# Patient Record
Sex: Female | Born: 1935 | Race: Black or African American | Hispanic: No | Smoking: Former smoker
Health system: Southern US, Community
[De-identification: ages and names within clinical notes are randomized; demographics above are authoritative.]

## PROBLEM LIST (undated history)

## (undated) DIAGNOSIS — F32A Depression, unspecified: Secondary | ICD-10-CM

## (undated) DIAGNOSIS — E7800 Pure hypercholesterolemia, unspecified: Secondary | ICD-10-CM

## (undated) DIAGNOSIS — R42 Dizziness and giddiness: Secondary | ICD-10-CM

## (undated) DIAGNOSIS — G8929 Other chronic pain: Secondary | ICD-10-CM

## (undated) DIAGNOSIS — F0390 Unspecified dementia without behavioral disturbance: Secondary | ICD-10-CM

## (undated) DIAGNOSIS — T8859XA Other complications of anesthesia, initial encounter: Secondary | ICD-10-CM

## (undated) DIAGNOSIS — N289 Disorder of kidney and ureter, unspecified: Secondary | ICD-10-CM

## (undated) DIAGNOSIS — J302 Other seasonal allergic rhinitis: Secondary | ICD-10-CM

## (undated) DIAGNOSIS — R0982 Postnasal drip: Secondary | ICD-10-CM

## (undated) DIAGNOSIS — I10 Essential (primary) hypertension: Secondary | ICD-10-CM

## (undated) DIAGNOSIS — I509 Heart failure, unspecified: Secondary | ICD-10-CM

## (undated) DIAGNOSIS — Z8719 Personal history of other diseases of the digestive system: Secondary | ICD-10-CM

## (undated) DIAGNOSIS — M549 Dorsalgia, unspecified: Secondary | ICD-10-CM

## (undated) DIAGNOSIS — IMO0002 Reserved for concepts with insufficient information to code with codable children: Secondary | ICD-10-CM

## (undated) DIAGNOSIS — G309 Alzheimer's disease, unspecified: Secondary | ICD-10-CM

## (undated) DIAGNOSIS — F0280 Dementia in other diseases classified elsewhere without behavioral disturbance: Secondary | ICD-10-CM

## (undated) DIAGNOSIS — K439 Ventral hernia without obstruction or gangrene: Secondary | ICD-10-CM

## (undated) DIAGNOSIS — F329 Major depressive disorder, single episode, unspecified: Secondary | ICD-10-CM

## (undated) DIAGNOSIS — T4145XA Adverse effect of unspecified anesthetic, initial encounter: Secondary | ICD-10-CM

## (undated) DIAGNOSIS — F419 Anxiety disorder, unspecified: Secondary | ICD-10-CM

## (undated) HISTORY — DX: Major depressive disorder, single episode, unspecified: F32.9

## (undated) HISTORY — DX: Pure hypercholesterolemia, unspecified: E78.00

## (undated) HISTORY — PX: EXTERNAL EAR SURGERY: SHX627

## (undated) HISTORY — PX: BLADDER SURGERY: SHX569

## (undated) HISTORY — PX: BACK SURGERY: SHX140

## (undated) HISTORY — PX: SHOULDER SURGERY: SHX246

## (undated) HISTORY — DX: Anxiety disorder, unspecified: F41.9

## (undated) HISTORY — DX: Depression, unspecified: F32.A

---

## 1998-01-25 ENCOUNTER — Encounter: Admission: RE | Admit: 1998-01-25 | Discharge: 1998-04-25 | Payer: Self-pay | Admitting: Orthopedic Surgery

## 1998-02-01 ENCOUNTER — Ambulatory Visit (HOSPITAL_COMMUNITY): Admission: RE | Admit: 1998-02-01 | Discharge: 1998-02-01 | Payer: Self-pay | Admitting: Orthopedic Surgery

## 1998-02-19 ENCOUNTER — Encounter: Admission: RE | Admit: 1998-02-19 | Discharge: 1998-05-20 | Payer: Self-pay | Admitting: Orthopedic Surgery

## 1998-03-22 ENCOUNTER — Other Ambulatory Visit: Admission: RE | Admit: 1998-03-22 | Discharge: 1998-03-22 | Payer: Self-pay | Admitting: Nephrology

## 1998-03-26 ENCOUNTER — Other Ambulatory Visit: Admission: RE | Admit: 1998-03-26 | Discharge: 1998-03-26 | Payer: Self-pay | Admitting: *Deleted

## 1998-04-20 ENCOUNTER — Emergency Department (HOSPITAL_COMMUNITY): Admission: EM | Admit: 1998-04-20 | Discharge: 1998-04-20 | Payer: Self-pay | Admitting: Emergency Medicine

## 1998-04-22 ENCOUNTER — Emergency Department (HOSPITAL_COMMUNITY): Admission: EM | Admit: 1998-04-22 | Discharge: 1998-04-22 | Payer: Self-pay | Admitting: Emergency Medicine

## 1998-05-14 ENCOUNTER — Encounter: Admission: RE | Admit: 1998-05-14 | Discharge: 1998-08-12 | Payer: Self-pay | Admitting: Orthopedic Surgery

## 1998-06-12 ENCOUNTER — Ambulatory Visit (HOSPITAL_COMMUNITY): Admission: RE | Admit: 1998-06-12 | Discharge: 1998-06-12 | Payer: Self-pay | Admitting: Nephrology

## 1999-01-18 ENCOUNTER — Encounter: Payer: Self-pay | Admitting: Emergency Medicine

## 1999-01-19 ENCOUNTER — Inpatient Hospital Stay (HOSPITAL_COMMUNITY): Admission: EM | Admit: 1999-01-19 | Discharge: 1999-01-19 | Payer: Self-pay | Admitting: Emergency Medicine

## 1999-03-01 ENCOUNTER — Encounter: Payer: Self-pay | Admitting: Emergency Medicine

## 1999-03-02 ENCOUNTER — Encounter: Payer: Self-pay | Admitting: Nephrology

## 1999-03-02 ENCOUNTER — Encounter: Payer: Self-pay | Admitting: Emergency Medicine

## 1999-03-04 ENCOUNTER — Inpatient Hospital Stay (HOSPITAL_COMMUNITY): Admission: EM | Admit: 1999-03-04 | Discharge: 1999-03-09 | Payer: Self-pay | Admitting: Emergency Medicine

## 1999-03-04 ENCOUNTER — Encounter: Payer: Self-pay | Admitting: Nephrology

## 1999-03-05 ENCOUNTER — Encounter: Payer: Self-pay | Admitting: Nephrology

## 1999-03-06 ENCOUNTER — Encounter: Payer: Self-pay | Admitting: Nephrology

## 1999-03-15 ENCOUNTER — Other Ambulatory Visit: Admission: RE | Admit: 1999-03-15 | Discharge: 1999-03-15 | Payer: Self-pay | Admitting: *Deleted

## 1999-05-24 ENCOUNTER — Ambulatory Visit (HOSPITAL_COMMUNITY): Admission: RE | Admit: 1999-05-24 | Discharge: 1999-05-24 | Payer: Self-pay | Admitting: Gastroenterology

## 2000-08-13 ENCOUNTER — Other Ambulatory Visit: Admission: RE | Admit: 2000-08-13 | Discharge: 2000-08-13 | Payer: Self-pay | Admitting: *Deleted

## 2000-12-11 ENCOUNTER — Encounter: Payer: Self-pay | Admitting: Urology

## 2000-12-17 ENCOUNTER — Observation Stay (HOSPITAL_COMMUNITY): Admission: RE | Admit: 2000-12-17 | Discharge: 2000-12-18 | Payer: Self-pay | Admitting: Urology

## 2001-05-10 ENCOUNTER — Encounter: Admission: RE | Admit: 2001-05-10 | Discharge: 2001-08-08 | Payer: Self-pay | Admitting: Internal Medicine

## 2001-09-02 ENCOUNTER — Other Ambulatory Visit: Admission: RE | Admit: 2001-09-02 | Discharge: 2001-09-02 | Payer: Self-pay | Admitting: *Deleted

## 2002-01-25 ENCOUNTER — Inpatient Hospital Stay (HOSPITAL_COMMUNITY): Admission: EM | Admit: 2002-01-25 | Discharge: 2002-01-30 | Payer: Self-pay

## 2002-02-03 ENCOUNTER — Encounter: Payer: Self-pay | Admitting: Internal Medicine

## 2002-02-03 ENCOUNTER — Encounter: Admission: RE | Admit: 2002-02-03 | Discharge: 2002-02-03 | Payer: Self-pay | Admitting: Internal Medicine

## 2002-02-24 ENCOUNTER — Ambulatory Visit (HOSPITAL_COMMUNITY): Admission: RE | Admit: 2002-02-24 | Discharge: 2002-02-24 | Payer: Self-pay | Admitting: Gastroenterology

## 2003-01-14 ENCOUNTER — Emergency Department (HOSPITAL_COMMUNITY): Admission: EM | Admit: 2003-01-14 | Discharge: 2003-01-14 | Payer: Self-pay | Admitting: Emergency Medicine

## 2003-01-14 ENCOUNTER — Encounter: Payer: Self-pay | Admitting: Emergency Medicine

## 2003-04-27 ENCOUNTER — Encounter: Admission: RE | Admit: 2003-04-27 | Discharge: 2003-04-27 | Payer: Self-pay | Admitting: Family Medicine

## 2003-04-27 ENCOUNTER — Encounter: Payer: Self-pay | Admitting: Internal Medicine

## 2003-05-09 ENCOUNTER — Other Ambulatory Visit: Admission: RE | Admit: 2003-05-09 | Discharge: 2003-05-09 | Payer: Self-pay | Admitting: Gynecology

## 2004-04-01 ENCOUNTER — Emergency Department (HOSPITAL_COMMUNITY): Admission: EM | Admit: 2004-04-01 | Discharge: 2004-04-01 | Payer: Self-pay | Admitting: Family Medicine

## 2004-04-11 ENCOUNTER — Emergency Department (HOSPITAL_COMMUNITY): Admission: EM | Admit: 2004-04-11 | Discharge: 2004-04-11 | Payer: Self-pay | Admitting: Emergency Medicine

## 2005-04-07 ENCOUNTER — Emergency Department (HOSPITAL_COMMUNITY): Admission: EM | Admit: 2005-04-07 | Discharge: 2005-04-07 | Payer: Self-pay | Admitting: Emergency Medicine

## 2005-04-16 ENCOUNTER — Encounter: Admission: RE | Admit: 2005-04-16 | Discharge: 2005-05-15 | Payer: Self-pay | Admitting: Orthopedic Surgery

## 2005-08-05 ENCOUNTER — Ambulatory Visit: Payer: Self-pay | Admitting: Internal Medicine

## 2005-08-11 ENCOUNTER — Ambulatory Visit: Payer: Self-pay | Admitting: Pulmonary Disease

## 2005-08-20 ENCOUNTER — Ambulatory Visit: Payer: Self-pay

## 2005-08-21 ENCOUNTER — Ambulatory Visit: Payer: Self-pay

## 2005-08-23 ENCOUNTER — Emergency Department (HOSPITAL_COMMUNITY): Admission: EM | Admit: 2005-08-23 | Discharge: 2005-08-23 | Payer: Self-pay | Admitting: Family Medicine

## 2005-08-24 ENCOUNTER — Emergency Department (HOSPITAL_COMMUNITY): Admission: EM | Admit: 2005-08-24 | Discharge: 2005-08-24 | Payer: Self-pay | Admitting: Emergency Medicine

## 2005-09-01 ENCOUNTER — Ambulatory Visit (HOSPITAL_BASED_OUTPATIENT_CLINIC_OR_DEPARTMENT_OTHER): Admission: RE | Admit: 2005-09-01 | Discharge: 2005-09-01 | Payer: Self-pay | Admitting: Pulmonary Disease

## 2005-09-05 ENCOUNTER — Ambulatory Visit: Payer: Self-pay | Admitting: Pulmonary Disease

## 2005-09-09 ENCOUNTER — Ambulatory Visit: Payer: Self-pay | Admitting: Internal Medicine

## 2005-09-24 ENCOUNTER — Ambulatory Visit: Payer: Self-pay | Admitting: Pulmonary Disease

## 2005-10-23 ENCOUNTER — Ambulatory Visit: Payer: Self-pay | Admitting: Pulmonary Disease

## 2006-03-02 ENCOUNTER — Ambulatory Visit: Payer: Self-pay | Admitting: Pulmonary Disease

## 2006-08-25 ENCOUNTER — Encounter: Admission: RE | Admit: 2006-08-25 | Discharge: 2006-08-25 | Payer: Self-pay | Admitting: Internal Medicine

## 2006-09-12 ENCOUNTER — Encounter: Admission: RE | Admit: 2006-09-12 | Discharge: 2006-09-12 | Payer: Self-pay | Admitting: Orthopedic Surgery

## 2006-09-26 ENCOUNTER — Emergency Department (HOSPITAL_COMMUNITY): Admission: EM | Admit: 2006-09-26 | Discharge: 2006-09-26 | Payer: Self-pay | Admitting: Family Medicine

## 2006-10-06 ENCOUNTER — Other Ambulatory Visit: Admission: RE | Admit: 2006-10-06 | Discharge: 2006-10-06 | Payer: Self-pay | Admitting: Gynecology

## 2006-11-09 ENCOUNTER — Ambulatory Visit (HOSPITAL_COMMUNITY): Admission: RE | Admit: 2006-11-09 | Discharge: 2006-11-10 | Payer: Self-pay | Admitting: Orthopedic Surgery

## 2007-11-01 ENCOUNTER — Emergency Department (HOSPITAL_COMMUNITY): Admission: EM | Admit: 2007-11-01 | Discharge: 2007-11-01 | Payer: Self-pay | Admitting: Emergency Medicine

## 2007-11-09 ENCOUNTER — Inpatient Hospital Stay (HOSPITAL_COMMUNITY): Admission: EM | Admit: 2007-11-09 | Discharge: 2007-11-12 | Payer: Self-pay | Admitting: Emergency Medicine

## 2008-09-24 ENCOUNTER — Emergency Department (HOSPITAL_COMMUNITY): Admission: EM | Admit: 2008-09-24 | Discharge: 2008-09-24 | Payer: Self-pay | Admitting: Emergency Medicine

## 2009-01-19 ENCOUNTER — Inpatient Hospital Stay (HOSPITAL_COMMUNITY): Admission: AD | Admit: 2009-01-19 | Discharge: 2009-01-23 | Payer: Self-pay | Admitting: Internal Medicine

## 2010-03-08 ENCOUNTER — Encounter: Admission: RE | Admit: 2010-03-08 | Discharge: 2010-03-08 | Payer: Self-pay | Admitting: Internal Medicine

## 2010-07-21 ENCOUNTER — Emergency Department (HOSPITAL_COMMUNITY): Admission: EM | Admit: 2010-07-21 | Discharge: 2010-07-22 | Payer: Self-pay | Admitting: Emergency Medicine

## 2011-01-09 LAB — POCT I-STAT, CHEM 8
BUN: 23 mg/dL (ref 6–23)
Creatinine, Ser: 1.3 mg/dL — ABNORMAL HIGH (ref 0.4–1.2)
Hemoglobin: 12.6 g/dL (ref 12.0–15.0)
Potassium: 3.5 mEq/L (ref 3.5–5.1)
Sodium: 142 mEq/L (ref 135–145)

## 2011-01-09 LAB — COMPREHENSIVE METABOLIC PANEL
AST: 28 U/L (ref 0–37)
CO2: 20 mEq/L (ref 19–32)
Calcium: 9 mg/dL (ref 8.4–10.5)
Chloride: 113 mEq/L — ABNORMAL HIGH (ref 96–112)
Creatinine, Ser: 1.27 mg/dL — ABNORMAL HIGH (ref 0.4–1.2)
GFR calc Af Amer: 49 mL/min — ABNORMAL LOW (ref >60)
GFR calc non Af Amer: 41 mL/min — ABNORMAL LOW (ref >60)
Glucose, Bld: 153 mg/dL — ABNORMAL HIGH (ref 70–99)
Total Bilirubin: 0.6 mg/dL (ref 0.3–1.2)

## 2011-01-09 LAB — CULTURE, BLOOD (ROUTINE X 2): Culture  Setup Time: 201109260927

## 2011-01-09 LAB — CBC
Hemoglobin: 11.6 g/dL — ABNORMAL LOW (ref 12.0–15.0)
MCHC: 32.8 g/dL (ref 30.0–36.0)
RDW: 15.5 % (ref 11.5–15.5)

## 2011-01-09 LAB — URINALYSIS, ROUTINE W REFLEX MICROSCOPIC
Ketones, ur: NEGATIVE mg/dL
Nitrite: NEGATIVE
Protein, ur: 100 mg/dL — AB
pH: 5.5 (ref 5.0–8.0)

## 2011-01-09 LAB — PROCALCITONIN: Procalcitonin: 2.33 ng/mL

## 2011-01-09 LAB — URINE CULTURE
Colony Count: >=100000
Culture  Setup Time: 201109260927

## 2011-01-09 LAB — LACTIC ACID, PLASMA: Lactic Acid, Venous: 1.8 mmol/L (ref 0.5–2.2)

## 2011-01-09 LAB — DIFFERENTIAL
Basophils Absolute: 0 10*3/uL (ref 0.0–0.1)
Basophils Relative: 0 % (ref 0–1)
Neutro Abs: 10.6 10*3/uL — ABNORMAL HIGH (ref 1.7–7.7)
Neutrophils Relative %: 89 % — ABNORMAL HIGH (ref 43–77)

## 2011-01-09 LAB — URINE MICROSCOPIC-ADD ON

## 2011-01-31 ENCOUNTER — Emergency Department (HOSPITAL_COMMUNITY)
Admission: EM | Admit: 2011-01-31 | Discharge: 2011-01-31 | Disposition: A | Discharge status: Home / Self Care | Payer: Medicare Other | Attending: Emergency Medicine | Admitting: Emergency Medicine

## 2011-01-31 ENCOUNTER — Emergency Department (HOSPITAL_COMMUNITY): Payer: Medicare Other

## 2011-01-31 DIAGNOSIS — R0602 Shortness of breath: Secondary | ICD-10-CM | POA: Insufficient documentation

## 2011-01-31 DIAGNOSIS — R059 Cough, unspecified: Secondary | ICD-10-CM | POA: Insufficient documentation

## 2011-01-31 DIAGNOSIS — R05 Cough: Secondary | ICD-10-CM | POA: Insufficient documentation

## 2011-01-31 DIAGNOSIS — Z7982 Long term (current) use of aspirin: Secondary | ICD-10-CM | POA: Insufficient documentation

## 2011-01-31 DIAGNOSIS — Z862 Personal history of diseases of the blood and blood-forming organs and certain disorders involving the immune mechanism: Secondary | ICD-10-CM | POA: Insufficient documentation

## 2011-01-31 DIAGNOSIS — Z8639 Personal history of other endocrine, nutritional and metabolic disease: Secondary | ICD-10-CM | POA: Insufficient documentation

## 2011-01-31 DIAGNOSIS — E119 Type 2 diabetes mellitus without complications: Secondary | ICD-10-CM | POA: Insufficient documentation

## 2011-01-31 DIAGNOSIS — R071 Chest pain on breathing: Secondary | ICD-10-CM | POA: Insufficient documentation

## 2011-01-31 DIAGNOSIS — N390 Urinary tract infection, site not specified: Secondary | ICD-10-CM | POA: Insufficient documentation

## 2011-01-31 DIAGNOSIS — N189 Chronic kidney disease, unspecified: Secondary | ICD-10-CM | POA: Insufficient documentation

## 2011-01-31 DIAGNOSIS — I129 Hypertensive chronic kidney disease with stage 1 through stage 4 chronic kidney disease, or unspecified chronic kidney disease: Secondary | ICD-10-CM | POA: Insufficient documentation

## 2011-01-31 DIAGNOSIS — I509 Heart failure, unspecified: Secondary | ICD-10-CM | POA: Insufficient documentation

## 2011-01-31 DIAGNOSIS — F068 Other specified mental disorders due to known physiological condition: Secondary | ICD-10-CM | POA: Insufficient documentation

## 2011-01-31 DIAGNOSIS — H409 Unspecified glaucoma: Secondary | ICD-10-CM | POA: Insufficient documentation

## 2011-01-31 DIAGNOSIS — K219 Gastro-esophageal reflux disease without esophagitis: Secondary | ICD-10-CM | POA: Insufficient documentation

## 2011-01-31 DIAGNOSIS — H9190 Unspecified hearing loss, unspecified ear: Secondary | ICD-10-CM | POA: Insufficient documentation

## 2011-01-31 LAB — COMPREHENSIVE METABOLIC PANEL
AST: 36 U/L (ref 0–37)
Albumin: 3.6 g/dL (ref 3.5–5.2)
BUN: 13 mg/dL (ref 6–23)
CO2: 24 mEq/L (ref 19–32)
Calcium: 9 mg/dL (ref 8.4–10.5)
Chloride: 106 mEq/L (ref 96–112)
Creatinine, Ser: 1.3 mg/dL — ABNORMAL HIGH (ref 0.4–1.2)
GFR calc non Af Amer: 40 mL/min — ABNORMAL LOW (ref >60)
Glucose, Bld: 119 mg/dL — ABNORMAL HIGH (ref 70–99)
Potassium: 4 mEq/L (ref 3.5–5.1)
Sodium: 136 mEq/L (ref 135–145)
Total Protein: 6.8 g/dL (ref 6.0–8.3)

## 2011-01-31 LAB — URINE MICROSCOPIC-ADD ON

## 2011-01-31 LAB — CBC
HCT: 37.3 % (ref 36.0–46.0)
MCH: 29 pg (ref 26.0–34.0)
MCHC: 33.5 g/dL (ref 30.0–36.0)
MCV: 86.5 fL (ref 78.0–100.0)
Platelets: 230 10*3/uL (ref 150–400)
RDW: 14.8 % (ref 11.5–15.5)
WBC: 7.8 10*3/uL (ref 4.0–10.5)

## 2011-01-31 LAB — DIFFERENTIAL
Eosinophils Absolute: 0.3 10*3/uL (ref 0.0–0.7)
Eosinophils Relative: 4 % (ref 0–5)
Lymphs Abs: 3.2 10*3/uL (ref 0.7–4.0)
Monocytes Absolute: 0.7 10*3/uL (ref 0.1–1.0)

## 2011-01-31 LAB — URINALYSIS, ROUTINE W REFLEX MICROSCOPIC
Bilirubin Urine: NEGATIVE
Nitrite: NEGATIVE
Specific Gravity, Urine: 1.01 (ref 1.005–1.030)
pH: 6 (ref 5.0–8.0)

## 2011-01-31 LAB — POCT CARDIAC MARKERS
CKMB, poc: 5.7 ng/mL (ref 1.0–8.0)
Myoglobin, poc: 386 ng/mL (ref 12–200)

## 2011-02-06 LAB — URINALYSIS, ROUTINE W REFLEX MICROSCOPIC
Bilirubin Urine: NEGATIVE
Hgb urine dipstick: NEGATIVE
Ketones, ur: NEGATIVE mg/dL
Nitrite: NEGATIVE
pH: 6 (ref 5.0–8.0)

## 2011-02-06 LAB — CBC
HCT: 39.8 % (ref 36.0–46.0)
Hemoglobin: 13.1 g/dL (ref 12.0–15.0)
MCHC: 32.8 g/dL (ref 30.0–36.0)
MCHC: 33.3 g/dL (ref 30.0–36.0)
MCV: 86.2 fL (ref 78.0–100.0)
RBC: 4.39 MIL/uL (ref 3.87–5.11)
RBC: 4.62 MIL/uL (ref 3.87–5.11)
RDW: 16.4 % — ABNORMAL HIGH (ref 11.5–15.5)

## 2011-02-06 LAB — COMPREHENSIVE METABOLIC PANEL
ALT: 36 U/L — ABNORMAL HIGH (ref 0–35)
Albumin: 3.3 g/dL — ABNORMAL LOW (ref 3.5–5.2)
Alkaline Phosphatase: 96 U/L (ref 39–117)
BUN: 13 mg/dL (ref 6–23)
BUN: 29 mg/dL — ABNORMAL HIGH (ref 6–23)
CO2: 19 mEq/L (ref 19–32)
CO2: 19 mEq/L (ref 19–32)
Chloride: 116 mEq/L — ABNORMAL HIGH (ref 96–112)
Creatinine, Ser: 2.67 mg/dL — ABNORMAL HIGH (ref 0.4–1.2)
GFR calc non Af Amer: 17 mL/min — ABNORMAL LOW (ref >60)
GFR calc non Af Amer: 38 mL/min — ABNORMAL LOW (ref >60)
Glucose, Bld: 121 mg/dL — ABNORMAL HIGH (ref 70–99)
Glucose, Bld: 79 mg/dL (ref 70–99)
Potassium: 3.7 mEq/L (ref 3.5–5.1)
Total Bilirubin: 0.5 mg/dL (ref 0.3–1.2)
Total Protein: 6.8 g/dL (ref 6.0–8.3)

## 2011-02-06 LAB — GLUCOSE, CAPILLARY
Glucose-Capillary: 85 mg/dL (ref 70–99)
Glucose-Capillary: 87 mg/dL (ref 70–99)
Glucose-Capillary: 98 mg/dL (ref 70–99)

## 2011-02-06 LAB — BASIC METABOLIC PANEL
BUN: 18 mg/dL (ref 6–23)
BUN: 27 mg/dL — ABNORMAL HIGH (ref 6–23)
CO2: 18 mEq/L — ABNORMAL LOW (ref 19–32)
CO2: 19 mEq/L (ref 19–32)
Calcium: 8.4 mg/dL (ref 8.4–10.5)
Calcium: 8.4 mg/dL (ref 8.4–10.5)
Calcium: 8.4 mg/dL (ref 8.4–10.5)
Chloride: 119 mEq/L — ABNORMAL HIGH (ref 96–112)
Creatinine, Ser: 1.24 mg/dL — ABNORMAL HIGH (ref 0.4–1.2)
Creatinine, Ser: 1.88 mg/dL — ABNORMAL HIGH (ref 0.4–1.2)
Creatinine, Ser: 2.39 mg/dL — ABNORMAL HIGH (ref 0.4–1.2)
GFR calc Af Amer: 31 mL/min — ABNORMAL LOW (ref >60)
GFR calc Af Amer: 51 mL/min — ABNORMAL LOW (ref >60)
GFR calc non Af Amer: 20 mL/min — ABNORMAL LOW (ref >60)
GFR calc non Af Amer: 26 mL/min — ABNORMAL LOW (ref >60)
GFR calc non Af Amer: 42 mL/min — ABNORMAL LOW (ref >60)
Glucose, Bld: 75 mg/dL (ref 70–99)
Potassium: 4.4 mEq/L (ref 3.5–5.1)
Sodium: 141 mEq/L (ref 135–145)
Sodium: 142 mEq/L (ref 135–145)

## 2011-02-06 LAB — APTT: aPTT: 27 seconds (ref 24–37)

## 2011-02-06 LAB — PROTIME-INR
INR: 1.1 (ref 0.00–1.49)
Prothrombin Time: 14 seconds (ref 11.6–15.2)

## 2011-02-06 LAB — CK: Total CK: 78 U/L (ref 7–177)

## 2011-02-06 LAB — URINE MICROSCOPIC-ADD ON

## 2011-03-11 NOTE — H&P (Signed)
NAME:  Tanya Smith, Tanya Smith                    ACCOUNT NO.:  [id number]   MEDICAL RECORD NO.:  [id number]          PATIENT TYPE:  EMS   LOCATION:  MAJO                         FACILITY:  [facility]   PHYSICIAN:  Delanna Ahmadi, M.D.  DATE OF BIRTH:  [birth year]   DATE OF ADMISSION:  11/01/2007  DATE OF DISCHARGE:  11/01/2007                              HISTORY & PHYSICAL   CHIEF COMPLAINT:  Weakness.   HISTORY OF PRESENT ILLNESS:  Ms. Tanya Smith is a 77 year old black female with  a history of multiple medical problems as listed below.  Ms. Tanya Smith went  to the ER with a mental status change after having found to have a blood  sugar of 25 at home about a week ago on December 5.  She was treated in  the ER for hypoglycemia.  A BUN and creatinine were found to be 52 and  2.6. The patient's Avandia and Actos were stopped.  A CT scan of the  brain was done and was negative.  The patient has been home for a week.  She has been very weak at home with failure to thrive.  She has lost  almost 30 pounds since mid November of last year.  Her p.o. intake has  been poor. She complains of some soreness in her throat which makes it  difficult for her to eat.  When she tries to eat to quickly she  regurgitates her food.  She has been coughing all day and night.  Her  daughter feels that she is breathing heavily.  She denies headache,  chest pain, abdominal pain, diarrhea, or urinary symptoms.  She has had  chronic constipation. She was diagnosed with thrush by Dr. Lysle Rubens on  October 07, 2007 and treated for such.  She remains on Diovan and  Metformin despite a creatinine of 2.6 documented 1 week ago.   ALLERGIES:  ACE INHIBITOR, cough.   CURRENT MEDICATIONS:  1. Metformin 1000 mg b.i.d.  2. Crestor 20 mg a day.  3. Diovan 160 mg a day.  4. Prevacid 30 mg a day.  5. Claritin 10 mg a day.  6. Lasix 80 mg  4x daily p.r.n. has not been taking.  7. Travatan eye drops.  8. Alphagan eye drops.  9. Aspirin 81 mg daily.  10.Stool softener b.i.d.  11.Detrol LA  4 mg a day.   MEDICAL:  1. Diabetes mellitus diagnosed 15 plus years ago.  2. Hypertension.  3. Congestive heart failure, probable diastolic dysfunction.  4. GERD on PPI.  5. Obstructive sleep apnea.  Wears CPAP sporadically.  6. Chronic edema.  7. Stress urinary incontinence status post 2 bladder operations.  8. Hyperlipidemia.  9. Chronic allergic rhinitis.  10.Glaucoma.  11.DDD with history of herniated disk and chronic thigh and right      upper calf numbness.  12.Deafness, left ear.  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  14.Abnormal PAP smear.  15.Pancolonic diverticulosis.  16.Gout.   SURGICAL:  1. Bilateral shoulder surgery, Dr. Eulas Post.  2. Left ear surgery, Dr. Cynda Familia.  3. Neck surgery, Dr. Cynda Familia.  4. Bladder operations 6 to 7 years ago, Dr. Janice Norrie.  5. Back surgery.  6. Bilateral tubal ligation.  7. Pubovaginal sling and anterior vaginal Hintze repair, Dr. Gaynelle Arabian,      February 2002.  8. Right shoulder operation, Dr. Veverly Fells, 2008.   INJURIES:  History of right ankle fracture.   FAMILY HISTORY:  Father died age 28 kidney failure.  Mother died age 10  aneurysm. Had diabetes, heart disease, hypertension.  Brother died of  alcohol cirrhosis.  Sister died in her 65s with cancer.  Sister died  from congestive heart failure.  Daughter diabetes, CHF, hypertension.  Daughter Crohn's disease.   SOCIAL HISTORY:  Separated from husband.  Five children.  One deceased.  No occupation.  Smoking, less than a pack a day 20 years.  Quit greater  than 10 years ago. Alcohol no.   REVIEW OF SYSTEMS:  As above.   PHYSICAL EXAMINATION:  GENERAL:  An obese white female. She is mildly  lethargic but oriented times 2-1/2.  VITAL SIGNS:  Blood pressure 76/50.  Heart rate 95.  Temperature 98.9.  Weight is 242.  Down 28 pounds since November 14 of this year.  EYES:  Pupils equal, reactive, respond to light.  Extraocular movements  intact.  Anicteric.   EARS:  TMs and EAC's clear.  Oral cavity, pharynx  moist mucous membranes clear.  NECK:  Supple.  No lymphadenopathy.  No carotid bruits.  LUNGS:  Clear.  HEART:  Regular rate and rhythm without murmur, gallop, or rub.  ABDOMEN: Obese.  Soft and nontender.  No mass or hepatosplenomegaly.  BREAST:  Deferred.  GU: Deferred.  RECTAL:  Deferred.  EXTREMITIES:  Showed no edema.  Normal pedal pulses.  NEUROLOGICAL:  Mental status as above.  Cranial nerves II-XII are  intact.  Strength is 5/5 in all extremities . Reflexes are 2/4  throughout.  Gait was not tested.   ASSESSMENT:  1. Severe hypotension.  I suspect she is significantly volume      depleted.  2. Recent azotemia suspect pre renal azotemia 1 week ago.  Rule out      for worsening renal failure.  3. Diabetes mellitus, recent episode of hypoglycemia.  Remains on      Metformin despite elevated creatinine.  Rule out acidosis.  4. Failure to thrive/generalized weakness.  5. A thirty pound weight loss/oropharyngeal dysphagia/sore throat.  6. Other medical problems listed above.   PLAN:  1. Admit to telemetry.  2. Repeat lab work to reevaluate electrolytes, renal function,      glucose.  3. IV fluid to raise blood pressure ASAP.  4. Hold all medications including Metformin and Diovan except for her      PPI and glaucoma eye drops.  5. Sliding scale insulin.  6. Dysphagia diet.  7. ENT consultation regarding oropharyngeal dysphagia and pain. May      need laryngoscopy.  8. Foley catheter.           ______________________________  Delanna Ahmadi, M.D.     JJG/MEDQ  D:  11/09/2007  T:  11/09/2007  Job:  [id number]

## 2011-03-11 NOTE — Discharge Summary (Signed)
NAME:  Tanya Smith, Tanya Smith                    ACCOUNT NO.:  [id number]   MEDICAL RECORD NO.:  [id number]          PATIENT TYPE:  INP   LOCATION:  5511                         FACILITY:  [facility]   PHYSICIAN:  Delanna Ahmadi, M.D.  DATE OF BIRTH:  09/29/1930   DATE OF ADMISSION:  01/19/2009  DATE OF DISCHARGE:  01/23/2009                               DISCHARGE SUMMARY   REASON FOR ADMISSION:  The patient presented with 3 weeks of progressive  nausea and some vomiting.  Any time she eat, she immediately vomited.  She could not keep fluids in very well.  Her weight was down 13 pounds.  She is becoming weak and having difficulty ambulating and was mildly  confused.  She had been started on some gabapentin, trimethoprim by her  urologist last month or so.  It appeared that she has stopped her  Detrol.   SIGNIFICANT FINDINGS:  VITAL SIGNS:  Blood pressure initially 86/54,  recheck 104/60; heart rate 68; temperature 98.1; weight was 215, which  was down 12 pounds.  GENERAL:  Alert and oriented x3.  LUNGS:  Clear.  HEART:  Regular rate and rhythm.  ABDOMEN:  Soft, nontender.  EXTREMITIES:  No edema.   LAB WORK AT ADMISSION:  CBC:  WBC 4.3, hemoglobin 12.6, platelet count  162.  Creatinine kinase 78, sodium 140, potassium 4.1, chloride 116,  bicarbonate 19, glucose 121, BUN 29, creatinine 2.67, AST 47, ALT 45,  total protein 6.9, albumin 3.3, total bilirubin 0.5.   HOSPITAL COURSE:  1. Nausea and vomiting/acute renal failure.  The patient was admitted      with nausea, vomiting, and acute renal failure.  The etiology of      the nausea and vomiting was not clear but may have originated from      new medications.  Her trimethoprim was stopped.  Her Detrol was      stopped.  She was continued on a low dose of gabapentin as well as      her normal outpatient medicines after holding her Diovan.  She was      given IV normal saline.  Her creatinine steadily dropped and at      discharge it was down to  1.24.  The patient's nausea and vomiting      almost immediately resolved with IV fluids and holding of      medications.  She was mildly confused at times during the      hospitalization, especially overnight.  At discharge, she was      eating well and ambulating without difficulty.   DISCHARGE DIAGNOSES:  1. Nausea and vomiting, probable drug side effect.  2. Acute renal failure.  3. Hypertension.  4. Diabetes mellitus, diet controlled.  5. Dementia.  6. Chronic constipation.  7. Gastroesophageal reflux disease.  8. Chronic edema.  9. Congestive heart failure/diastolic dysfunction.  10.Obstructive sleep apnea, on continuous positive airway pressure.  11.Glaucoma.  12.Stress urinary incontinence.  13.Hyperlipidemia.  14.Perennial allergic rhinitis.  15.Degenerative disk disease, lumbosacral spine.  16.Left ear deafness.  17.Degenerative joint disease of  the shoulder.  18.Pancolonic diverticulosis.  19.Gout.   PROCEDURES:  None.   DISCHARGE MEDICATIONS:  1. Crestor 20 mg one p.o. daily.  2. Namenda 10 mg one p.o. b.i.d.  3. Dulcolax 1 as needed for constipation.  4. Prilosec 20 mg daily.  5. Gabapentin 100 mg t.i.d.  6. Flonase 1 spray each nostril once a day.  7. Lasix 80 mg a day only as needed for edema.  8. Potassium 20 mEq only with Lasix.  9. Glaucoma eye drops as per prior to admission.  10.Colace 100 mg once a day.  11.Diovan, trimethoprim, Detrol held at discharge.   DISPOSITION:  Discharged to home.  Follow up in 1 week Dr. Laurann Montana.   CODE STATUS:  Full code.   DIET:  Low-sodium regular diet.           ______________________________  Delanna Ahmadi, M.D.     JJG/MEDQ  D:  01/23/2009  T:  01/23/2009  Job:  [id number]   cc:   Pierre Bali I. Gaynelle Arabian, M.D.

## 2011-03-11 NOTE — H&P (Signed)
NAME:  Tanya Smith, Tanya Smith                    ACCOUNT NO.:  [id number]   MEDICAL RECORD NO.:  [id number]          PATIENT TYPE:  INP   LOCATION:  NA                           FACILITY:  [facility]   PHYSICIAN:  Delanna Ahmadi, M.D.  DATE OF BIRTH:  Oct 27, 1930   DATE OF ADMISSION:  01/19/2009  DATE OF DISCHARGE:                              HISTORY & PHYSICAL   CHIEF COMPLAINT:  Nausea.   HISTORY OF PRESENT ILLNESS:  The patient is brought in by her grandchild  today.  In the last 3 weeks, she has been having progressive nausea.  Any time she eats, she tends to immediately vomit.  She had been keeping  fluids down.  Her weight is down 13 pounds.  She is becoming generally  weak and having difficulty ambulating.  She has been mildly confused at  times.  She denies chest pain, shortness of breath, cough, headache,  fever, chills, abdominal pain, diarrhea, blood in her stool, melena, or  edema.  She has been started on some gabapentin and trimethoprim by her  urologist.  It is not clear exactly when that was done.   PAST MEDICAL HISTORY:   ALLERGIES:  ACE INHIBITOR COUGH.   CURRENT MEDICATIONS:  1. Crestor 20 mg daily.  2. Aspirin 81 mg daily.  3. Possibly Detrol LA 4 mg a day.  4. Lasix 80 mg a day p.r.n. for edema, has not been recently taking.  5. Potassium chloride 20 mEq daily with Lasix only.  6. Namenda 10 mg b.i.d.  7. Dulcolax p.r.n. constipation, takes about once a week.  8. Cyclobenzaprine 10 mg q.h.s. p.r.n.  9. Flonase 1 spray each nostril daily.  10.Prilosec 20 mg daily.  11.Glaucoma eye drops per her ophthalmologist.  12.Diovan mg daily.  13.Gabapentin 100 mg t.i.d.  14.Trimethoprim 100 mg daily.   MEDICAL:  1. Diabetes mellitus, now diet controlled with weight loss.  2. Hypertension.  3. Dementia on Namenda.  History of delusions and hallucinations.  4. Chronic constipation.  5. Gastroesophageal reflux disease.  6. Chronic edema.  7. Congestive heart failure/diastolic  dysfunction.  8. Obstructive sleep apnea on CPAP.  9. Stress urinary incontinence status post 2 bladder operations.  10.Hyperlipidemia.  11.Perennial allergic rhinitis.  12.Glaucoma.  13.DDD with history of herniated disk and right upper calf numbness.  14.Deafness in the left ear and partially in the right ear.  15.DJD the shoulder.  16.Pancolonic diverticulosis.  17.Gout.   SURGICAL:  1. Bilateral shoulder surgery.  2. Left ear surgery.  3. Neck surgery.  4. Bladder operations about 7 years by Dr. Janice Norrie.  5. Back operation for disk problem.  6. Bilateral tubal ligation.  7. Pubovaginal sling and anterior vaginal Tooley repair, Dr. Hartley Barefoot,      February 2002.   FAMILY HISTORY:  Father died at the age of 19 of failure.  Mother died  at age 14 of aneurysm.  She had diabetes, heart disease, hypertension.  Brother died of alcoholic cirrhosis.  Sister died of in the 77s of  cancer.  Sister died of congestive heart failure.  Daughter - diabetes,  CHF, hypertension.  Daughter - Crohn disease.   SOCIAL HISTORY:  Separated from husband.  She has 5 children, 1  deceased.  She is retired.  She smoked less than a pack a day for 20  years, quit greater than 10 years ago.  Alcohol:  No.  Exercise:  Not  readily.   REVIEW OF SYSTEMS:  As per HPI.   PHYSICAL EXAM:  Obese black female.  Blood pressure initially measured at 86/54.  Recheck by me was 104/60.  Heart rate 68, temperature 90.1.  Weight is 215.  She is alert and oriented x3 in the office.  EYES:  Pupils equal, round, and respond to light.  Extraocular movements  intact.  Anicteric.  EARS:  TMs clear.  OROPHARYNX:  Mildly dry mucous membranes, otherwise clear.  NECK:  Supple.  No lymphadenopathy.  Lungs: Clear.  HEART:  Regular rate and rhythm without murmur, gallop, or rub.  ABDOMEN:  Obese, soft, nontender.  No mass or hepatosplenomegaly.  EXTREMITIES:  No edema.  NEUROLOGIC:  Cranial nerves II-XII are intact.  She has  strength 5/5 in  all extremities.   Lab workup is pending.   ASSESSMENT:  1. Nausea/vomiting/generalized weakness/failure to thrive, all over      the last 3 weeks.  Etiology is not clear.  I suspect this may be      metabolic.  Rule out UTI, dehydration/azotemia, electrolyte      disorder.  She also has some new medications which could be playing      a role such as gabapentin or trimethoprim.  2. Multiple other medical problems as listed above, stable.   PLAN:  Admit to [facility] routine lab workup, EKG, and  chest x-ray.  Gentle hydration with normal saline unless lab work  reveals alternative IV fluids.  Continue outpatient medications except  for hold Diovan for now until blood pressure is stabilized.  Further  treatment as indicated by workup.           ______________________________  Delanna Ahmadi, M.D.     JJG/MEDQ  D:  01/19/2009  T:  01/19/2009  Job:  [id number]

## 2011-03-14 NOTE — Op Note (Signed)
[facility]  Patient:    Tanya Smith, Tanya Smith Visit Number: [id number] MRN: [id number]          Service Type: END Location: ENDO Attending Physician:  Mauri Brooklyn Ii Dictated by:   Mickeal Skinner, M.D. Proc. Date: 02/24/02 Admit Date:  02/24/2002 Discharge Date: 02/24/2002   CC:         Delanna Ahmadi, M.D.   Operative Report  DATE OF BIRTH:  [birth year].  PROCEDURE PERFORMED:  Esophagogastroduodenoscopy  ENDOSCOPIST:  Mickeal Skinner, M.D.  INDICATIONS FOR PROCEDURE:  The patient is a 76 year old female.  [name] has chronic type 2 diabetes mellitus and is undergoing diagnostic esophagogastroduodenoscopy to evaluate persistent nausea and vomiting.  Recent upper GI - small bowel follow-through x-ray was normal except for the presence of an incidental cervical esophageal web.  PREMEDICATION:  Versed 2.5 mg, fentanyl 25 mcg.  INSTRUMENT USED:  Olympus gastroscope.  DESCRIPTION OF PROCEDURE:  After obtaining informed consent, [name] was placed in the left lateral decubitus position.  I administered intravenous Versed and intravenous fentanyl to achieve conscious sedation for the procedure.  The patients blood pressure, oxygen saturations and cardiac rhythm were monitored throughout the procedure and documented in the medical record.  The Olympus gastroscope was passed through the posterior hypopharynx into the proximal esophagus without difficulty.  The hypopharynx, larynx and vocal cords appeared normal.  Esophagoscopy:  The proximal, mid and lower segments of the esophagus appeared completely normal.  Gastroscopy:  Retroflex view of the gastric cardia and fundus was normal. Endoscopically there is no evidence of for the presence of a hiatal hernia. Retroflex view of the gastric cardia and fundus was normal.  The gastric body, antrum and pylorus appeared completely normal.  Duodenoscopy:  The duodenal bulb,  midduodenum and distal duodenum appeared normal.  ASSESSMENT:  Normal esophagogastroduodenoscopy.  No endoscopic evidence of for the presence of esophageal mucosal inflammation or gastric outlet obstruction.  RECOMMENDATIONS:  I would recommend scheduling [name] for a nuclear medicine solid phase gastric emptying study to look for signs of diabetic gastroparesis. Dictated by:   Mickeal Skinner, M.D. Attending Physician:  Mauri Brooklyn Ii DD:  02/24/02 TD:  02/25/02 Job: [id number]

## 2011-03-14 NOTE — Discharge Summary (Signed)
NAME:  Tanya Smith, Tanya Smith                    ACCOUNT NO.:  [id number]   MEDICAL RECORD NO.:  [id number]          PATIENT TYPE:  INP   LOCATION:  [id number]                         FACILITY:  [facility]   PHYSICIAN:  Delanna Ahmadi, M.D.  DATE OF BIRTH:  [birth year]   DATE OF ADMISSION:  11/09/2007  DATE OF DISCHARGE:  11/12/2007                               DISCHARGE SUMMARY   REASON FOR ADMISSION:  Tanya Smith is a 77 year old black female who  presented to the office with several days of weakness, failure to  thrive, and decreased appetite.  She had been to the ER 1 week prior  with an episode of hypoglycemia.  She had a documented 30-pound weight  loss in the last 2 months.  In my office, she was hypotensive with a  blood pressure in the 74/50 range.  In the ER, she was found to have  hypoglycemia with a blood sugar in the 50s.   SIGNIFICANT FINDINGS:  See physical examination on chart and lab work on  chart.   HOSPITAL COURSE:  1. Failure to thrive/weight-loss.  The patient was seen by physical      therapy.  She ambulated without difficulty.  She was felt to need      no further physical therapy help.  2. Oropharynx station.  The patient was complaining of trouble      swallowing with food catching in her throat.  Also some pain.  She      had a bedside swallowing evaluation done by speech therapy, and a      dysphagia III diet was recommended.  Clear thin liquids were      recommended.  She did not have significant risk for aspiration, and      a modified barium swallow was not recommended.  An outpatient visit      with Tanya Smith for an ENT evaluation was scheduled for the      day of her discharge.  3. Hypoglycemia.  The patient's oral medications were all stopped.      Her blood sugars remained in the lower to mid 140s during the      hospitalization.  At discharge, her oral medications were held, and      her blood sugars will be monitored as an outpatient.  4. Hypertension.  The  patient was taken off of all blood pressure      medicines.  With IV fluids, her blood pressure quickly rose and      stabilized.  Her labs did reveal some dehydration at admission, and      she was treated with IV fluids with normalization of her BUN and      creatinine.   DISCHARGE DIAGNOSES:  1. Hypertension.  2. Hypoglycemia.  3. Failure to thrive.  4. Oropharyngeal dysphagia.  5. Diabetes mellitus.  6. Hypertension.  7. Dementia.   PROCEDURE:  Bedside swallowing evaluation.   DISCHARGE MEDICATIONS:  1. Crestor 20 mg p.o. daily.  2. Prevacid 30 mg p.o. daily.  3. Claritin 10 mg p.o. daily.  4. Aspirin  81 mg p.o. daily.  5. Detrol LA 4 mg p.o. daily.  6. Stool softener twice a day.  7. Alphagan ophthalmic 1 drop each eye twice a day.  8. Travatan ophthalmic 1 drop twice a day each eye.  9. Lasix 80 mg a day as needed for swelling.  10.Potassium chloride 20 mEq a day with Lasix only.  11.Namenda 10 mg 1 a day.  12.Allopurinol daily.   At discharge, metformin, Avandia and Diovan were all held.   FOLLOW UP:  1 week Tanya Smith, today with Tanya Smith.   DISPOSITION:  Discharge to home.   DIET:  Diabetic diet, chopped, dysphagia III.   ACTIVITY:  As tolerated.           ______________________________  Delanna Ahmadi, M.D.     JJG/MEDQ  D:  11/13/2007  T:  11/13/2007  Job:  [id number]

## 2011-03-14 NOTE — Op Note (Signed)
NAME:  Tanya Smith, Tanya Smith                    ACCOUNT NO.:  [id number]   MEDICAL RECORD NO.:  [id number]          PATIENT TYPE:  OIB   LOCATION:  5022                         FACILITY:  [facility]   PHYSICIAN:  Doran Heater. Veverly Fells, M.D. DATE OF BIRTH:  November 09, 1929   DATE OF PROCEDURE:  DATE OF DISCHARGE:  11/10/2006                               OPERATIVE REPORT   PREOPERATIVE DIAGNOSIS:  Right shoulder pain secondary to rotator cuff  tear and chronic AC joint arthritis.   POSTOPERATIVE DIAGNOSIS:  Right shoulder pain secondary to rotator cuff  tear and chronic AC joint arthritis.   PROCEDURE PERFORMED:  Right shoulder arthroscopy with extensive  intraarticular debridement of torn superior labrum, anterior and  posterior, with arthroscopic biceps tenotomy followed by debridement of  rotator cuff tear and arthroscopic bursectomy followed by mini open  biceps tenodesis in the groove using Arthrex biocore screw and also mini  open rotator cuff repair and open distal clavicle excision.   ATTENDING SURGEON:  Doran Heater. Veverly Fells, M.D.   ASSISTANT:  Abbott Pao. Doren Custard, P.A.   ANESTHESIA:  General anesthesia was used.   ESTIMATED BLOOD LOSS:  Minimal.   FLUID REPLACEMENT:  1200 mL of crystalloid.   INDICATIONS:  The patient is a 77 year old female who suffered a right  shoulder rotator cuff tear.  The patient also has symptomatic AC  arthrosis and failed conservative management at this point and presents  with disabling pain desiring surgical management to relieve pain and  restore function.  Informed consent was obtained.   DESCRIPTION OF THE PROCEDURE:  After an adequate level of anesthesia was  achieved, the patient was positioned in the modified beach chair  position.  All neurovascular structures were padded appropriately, and  the patient was secured to the operating table.  The right shoulder was  correctly identified and examined under anesthesia.  The patient had  near full passive range of  motion with some slight stiffness about 160  and some mild stiffness at 90 degrees and external rotation, basically  seeing that she had a tight posterior inferior capsule.  Gentle attempts  at manipulation did not yield a pop or any significant improvement in  range of motion.  The patient's internal rotation is about 30 degrees  with the arm abducted.  At this point, we sterilely prepped and draped  the right shoulder in the usual manner.  We entered the shoulder  arthroscopically using standard arthroscopic portals.  Anterior,  posterior and lateral portals were all created in a similar fashion with  infiltration of the skin with 0.25% Marcaine with epinephrine followed  by incision with a 11-blade scalpel, introduction of the cannula into  the joint using blunt obturators.  Diagnostic fluoroscopy revealed  extensive hyperemia and capsulitis within the joint.  There was a torn  proximal biceps and superior labrum.  Performed a biceps tenotomy and a  thorough superior labral debridement back to stable labral tissue.  Subscapularis had a partial tear.  We debrided this back to its  insertion point.  Approximately 15% to 20% of the insertion of  the  subscapularis was compromised.  We debrided this and then scuffed up the  lesser tuberosity to encourage healing.  There was grade 2 and grade 3  chondromalacia noted within the joint and along the humeral head and the  glenoid surface.  Posterior labrum appeared to be in good condition.  The patient had a full-thickness rotator cuff tear involving the  supraspinatus, infraspinatus, and some of teres minor.  At this point we  went ahead and placed the scope in the subacromial space, performed a  thorough bursectomy due to fears over potentially not having a  repairable rotator cuff.  We did not perform an acromioplasty as we did  not want to compromise this, the CL ligament and the coracoacromial  arch.  There did not appear to be significant  overhanging osteophytes.  At this point we made a small incision overlying the [facility] joint.  Dissection was carried sharply down through subcutaneous tissue.  Subperiosteal dissection of the distal clavicle was performed after  splitting the deltrotrapezial fascia.  We then performed a distal  clavicle excision, excising the distal 5 to 7 mm of distal clavicle.  We  applied bone wax to the clavicle, thoroughly irrigated the AC interval  and then repaired the deltotrapezial fascia using interrupted 0-Vicryl  suture followed by 2-0 Vicryl subcutaneous and 4-0 Monocryl for skin.  We approached the rotator cuff through a mini open incision starting at  the raphe between the anterolateral head of the deltoid at the corner of  the acromion.  We split the deltoid in line with its fiber at the raphe  again, identified the biceps tendon, went ahead and cut that so that  that would retract.  Did not perform biceps tenotomy in view of the  patient's significant size.  This should not create a deformity for her.  We also identified the rotator cuff tear, and we mobilized the rotator  cuff on both sides of the cuff with #2 FiberWire suture and a Cobb  elevator.  We were able to get the cuff into a near anatomic location  using margin convergent sutures medially with #2 FiberWire suture tied  simply followed by a single 5.5 mm Arthrex biocore screw anchor  placed  at the articular margin and then the sutures were placed in a horizontal  mattress through the anterior and posterior leaf of that tear.  We were  happy with our low-profile repair following the operation.  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  We were easily able to adduct the patient's arm to  its side, and at this point thoroughly irrigated and then closed the  deltoid to itself with 0-Vicryl suture followed by 2-0 Vicryl  subcutaneous and 4-0 Monocryl for skin.  The patient tolerated the surgery well and was taken to the recovery room in stable  condition.      Doran Heater. Veverly Fells, M.D.  Electronically Signed     SRN/MEDQ  D:  11/09/2006  T:  11/10/2006  Job:  [id number]

## 2011-03-14 NOTE — Procedures (Signed)
NAME:  Tanya Smith, Tanya Smith                    ACCOUNT NO.:  [id number]   MEDICAL RECORD NO.:  [id number]          PATIENT TYPE:  OUT   LOCATION:  SLEEP CENTER                 FACILITY:  [facility]   PHYSICIAN:  Danton Sewer, M.D. [facility] DATE OF BIRTH:  02-13-1930   DATE OF STUDY:  09/01/2005                              NOCTURNAL POLYSOMNOGRAM   REFERRING PHYSICIAN:  Dr. Danton Sewer   INDICATION FOR THE STUDY:  Hypersomnia with sleep apnea.   EPWORTH SCORE:  0   SLEEP ARCHITECTURE:  The patient had a total sleep time of 300 minutes with  adequate slow wave sleep but very decreased REM. Sleep onset latency was  normal but REM onset was quite prolonged at 259 minutes. Sleep efficiency  was 81%.   RESPIRATORY DATA:  The patient was found to have 224 hypopneas and 37 apneas  for a respiratory disturbance index of 56 events per hour. There was  moderate snoring noted, and the patient stayed in the supine position  throughout the study.   OXYGEN DATA:  There was O2 desaturation as low as 80% associated with the  patient's obstructive events.   CARDIAC DATA:  No clinically significant cardiac arrhythmias.   MOVEMENT/PARASOMNIA:  There were 30 leg jerks during the study with 1.8 per  hour resulting in arousal or awakening. This is clinically insignificant.   IMPRESSION:  Severe obstructive sleep apnea with a respiratory disturbance  index of 56 events per hour and oxygen desaturation as low as 88%. Treatment  for this degree of sleep apnea should focus primarily on weight loss as well  as CPAP.           ______________________________  Danton Sewer, M.D. [facility]  Diplomate, American Board of Sleep  Medicine     KC/MEDQ  D:  09/05/2005 10:47:04  T:  09/05/2005 13:32:39  Job:  [id number]

## 2011-03-14 NOTE — Op Note (Signed)
[facility]  Patient:    Tanya Smith, Tanya Smith                        MRN: [id number] Proc. Date: 12/17/00 Adm. Date:  [id number] Attending:  Veverly Fells CC:         Delanna Ahmadi, M.D., [facility]   Operative Report  PREOPERATIVE DIAGNOSES: 1. Urinary incontinence. 2. Diabetes. 3. Hypertension. 4. Obesity.  POSTOPERATIVE DIAGNOSES: 1. Urinary incontinence. 2. Diabetes. 3. Hypertension. 4. Obesity.  OPERATION: 1. Pubovaginal sling. 2. Anterior vaginal Kenney repair.  SURGEON:  Sigmund I. Gaynelle Arabian, M.D.  ANESTHESIA:  General endotracheal.  PREPARATION:  After appropriate pre-anesthesia, the patient was brought to the operating room and placed on the operating table in the dorsal supine position where general endotracheal anesthesia was introduced.  She was then replaced in the low Allen stirrup dorsolithotomy position where the pubis was prepped with Betadine solution and draped in the usual fashion.  REVIEW OF HISTORY:  This 77 year old obese, diabetic, hypertensive female has grade III cystourethrocele and a positive Q-tip test and Marshall test, para 6-6-0, status post "bladder tacking" surgery 10 years ago, with urinary incontinence and a past history of right lower extremity neuropathy.  The patient has a history of acute ruptured disk in 1982 with urinary incontinence beginning right after that.  She also has urge incontinence an urodynamic findings of a leak under pressure of 23 cm of water, and gross bladder instability during the entire procedure brought on by cough.  She is now for a pubovaginal sling, will need anticholinergics postoperatively.  DESCRIPTION OF PROCEDURE:  Marcaine plain 05%, 20 cc plus epinephrine 1:200,000 was injected into the central midline pubovaginal cervical large tissue.  The cervix was identified.  A midline incision was made from the cervix to the urethra in the midline, and  subcutaneous tissue was dissected with blunt and sharp dissection into the retropubic space bilaterally.  A rich network of veins was identified in the submucosal space, and this was cauterized, and other bleeding was ligated with 3-0 Vicryl.  Anterior vaginal vault repair was accomplished with horizontal mattress 3-0 Vicryl suture.  A 4 x 7 cm portion of Tutoplast fascia was selected and folded appropriately.  Retropubic anchored were then placed with the In-Fast system, and the sling was sutured over the Prolene sutures into position in the retropubic space.  This was evaluated with a right-angle clamp and was noted to be loose.  The T portion of the tissue was then sutured with 3-0 Vicryl lateral-ward.  Following this, antibiotic irrigation was then accomplished. Cystoscopy showed no sutures in the bladder, and the devascularized portion of the tip of the vagina was removed and the vagina closed in two layers with 3-0 Vicryl suture.  The mucosa was closed with 3-0 Vicryl running buried suture. No bleeding was noted at the end of this portion of the case, and vaginal packing was placed.  Cystoscopy showed no evidence of suture in the bladder, and photo documentation was accomplished.  The patient was then awakened after being given B & O suppository and IV Toradol.  She is taken to the recovery room in good condition. DD:  12/17/00 TD:  12/18/00 Job: [id number]

## 2011-03-14 NOTE — H&P (Signed)
[facility]  Patient:    Tanya Smith, Tanya Smith Visit Number: [id number] MRN: [id number]          Service Type: Attending:  Nehemiah Settle, M.D. Dictated by:   Nehemiah Settle, M.D. Adm. Date:  01/25/02                           History and Physical  HISTORY OF PRESENT ILLNESS:  The patient is a 77 year old black female admitted with fever and mental status changes.  In early March of this year she developed nausea and vomiting without abdominal pain or diarrhea.  She saw Dr. Laurann Montana approximately January 03, 2002, and was told to call if it persisted, which it did.  However, they never called.  The nausea and vomiting is almost daily, and occurs after every meal, even after simply drinking water.  Today her family noted her to be lethargic and confused and brought her to the emergency department.  She complained of discomfort with urination.  She has been on amoxicillin 250 mg t.i.d., but she cannot tell me why or who prescribed it.  PAST SURGICAL HISTORY:  1. Bladder surgery x 2.  2. Lumbar laminectomy.  3. Laser surgery to eyes.  MEDICATIONS:  1. Glucophage 1000 mg b.i.d.  2. Prevacid 30 mg daily.  3. Methocarbamol 500 mg t.i.d.  4. Ibuprofen 600 mg t.i.d.  5. Lasix 40 mg daily.  6. Amoxicillin 500 mg q.8h.  7. Cyclobenzaprine 10 mg q.h.s.  8. K-Lor 1 daily.  9. Oxycodone with APAP q.4h. (taken rarely). 10. Lipitor 40 mg daily. 11. Claritin 10 mg daily. 12. Ditropan XL 10 mg daily. 13. Amaryl 4 mg daily. 14. Avandia 8 mg daily.  PAST MEDICAL HISTORY:  1. Diabetes mellitus with retinopathy, but no neuropathy or nephropathy that    is known to the patient. 2. Hypercholesterolemia. 3. Low back pain. 4. Congestive heart failure.  ALLERGIES:  No known drug allergies.  SOCIAL HISTORY:  She lives alone near her daughter.  Her husband has had a stroke and lives in a different apartment nearby.  She is independent in ADLs and some IADLs.  She does not  smoke, nor does she drink alcohol.  FAMILY HISTORY:  Noncontributory.  REVIEW OF SYSTEMS:  She states she has had some trouble with her low back. She has been on Flexeril and now has been switched to methocarbamol.  She feels like it has been helpful.  PHYSICAL EXAMINATION:  GENERAL:  Obese.  In no acute distress.  Alert and oriented x3 at present.  VITAL SIGNS:  Temperature 102.7, blood pressure 117/90, pulse 123, respiratory rate 20.  SKIN:  Warm and dry.  NECK:  Supple.  CHEST:  Clear to auscultation and percussion.  CARDIAC:  Normal S1, S2.  Without any S3, S4, murmur, rub, or click.  ABDOMEN:  Soft and nontender.  With normal bowel sounds, without hepatosplenomegaly or mass.  EXTREMITIES:  Without cyanosis, clubbing, or edema.  LABORATORY DATA:  White count 18,500, hemoglobin 11.5, MCV 82.  Electrolytes normal except for potassium 3.4.  BUN 13, creatinine 1.2.  LFTs normal.  UA shows 7-10 wbcs per high-power field and many bacteria.  Chest x-ray is clear.  IMPRESSION: 1. Fever, mental status changes, dysuria, pyuria, leukocytosis:  I suspect she    has urosepsis.  Will start her on Tequin 400 mg pending cultures.  Culture    may not grow much as she is already on amoxicillin. 2.  Nausea and vomiting:  This appears to be unrelated to #1.  Continue    Prevacid.  Consider EGD or other evaluation. 3. Diabetes mellitus. 4. Hypercholesterolemia. 5. Chronic low back pain. Dictated by:   Nehemiah Settle, M.D. Attending:  Nehemiah Settle, M.D. DD:  01/25/02 TD:  01/26/02 Job: [id number]

## 2011-03-14 NOTE — Discharge Summary (Signed)
[facility]  Patient:    Tanya Smith, BONFIGLIO Visit Number: [id number] MRN: [id number]          Service Type: MED Location: [id number] Attending Physician:  Irven Shelling Dictated by:   Delanna Ahmadi, M.D. Admit Date:  01/25/2002 Discharge Date: 01/30/2002                             Discharge Summary  REASON FOR ADMISSION:  This is a 77 year old black female admitted with fever and mental status changes.  In early March 2003, she developed nausea and vomiting without abdominal pain or diarrhea.  This has persisted for the last several weeks.  The nausea and vomiting is almost daily, and occurs after every meal.  Today the family noted that the patient had become lethargic and confused.  She complained of some discomfort with urination.  She was taken to the ER, and found to have a fever.  PHYSICAL EXAMINATION:  GENERAL:  An obese black female who is alert and oriented x3.  VITAL SIGNS:  Temperature 102.7, blood pressure 117/90, heart rate 123, respiratory rate 20.  The rest of the examination was unremarkable.  LABORATORY DATA:  White blood cell count 18.5 with a left shift, hemoglobin 11.5, platelet count 223.  Chemistries:  Sodium 137, potassium 3.4, chloride 101, bicarbonate 27, glucose 186, BUN 13, creatinine 1.2, calcium 9.2, total protein 7.7, albumin 3.1.  AST 21, ALT 17, alkaline phosphatase 111, total bilirubin 0.9.  Urinalysis showed 7 to 10 white blood cells and 0 to 2 red blood cells.  Chest x-ray showed no acute disease.  HOSPITAL COURSE:  #1 - FEVER, MENTAL STATUS CHANGES, PYURIA, AND LEUKOCYTOSIS:  The patient was admitted with the above constellation of problems.  Urosepsis was suspected. She was started on Tequin IV.  Blood cultures were obtained and were negative. Unfortunately, an urine culture was not obtained.  The patient remained febrile through January 28, 2002, but on January 29, 2002, was afebrile and remained so.   Her vital signs stabilized.  Her mental status changes resolved after one day.  She was given IV hydration.  By January 28, 2002, her white count dropped down to 9.1.  By January 29, 2002, her fever had resolved and she was feeling better.  Her IV fluids were discontinued.  She ambulated on the floor.  By January 30, 2002, she remained afebrile.  She was discharged in good condition.  #2 - NAUSEA AND VOMITING:  She had complained of several weeks of nausea and vomiting prior to admission.  Her p.o. intake was poor during the initial days, and this was not a problem.  She began taking p.o. prior to discharge, and the last two days she did not have problems with vomiting.  This problem can be followed up as an outpatient.  #3 - DIABETES MELLITUS:  Her sugars were controlled on sliding scale insulin and Avandia.  Her Amaryl and glucophage were held.  Blood sugars on January 29, 2002, prior to discharge were ranging between 140 and 242.  DISCHARGE DIAGNOSES: 1. Urosepsis. 2. Nausea and vomiting. 3. Diabetes mellitus. 4. History of congestive heart failure. 5. Hypercholesterolemia.  DISCHARGE MEDICATIONS: 1. Prevacid 30 mg p.o. q.d. 2. Lasix 40 mg p.o. q.d. 3. Tequin 400 mg q.d. x7 days. 4. Avandia 8 mg p.o. q.d. 5. Glucophage as before. 6. Hold Amaryl. 7. Ditropan XL 10 mg p.o. q.d. 8. Tylenol one p.o.  q.6h. p.r.n.  DIET:  Diabetic diet.  ACTIVITY:  As tolerated.  DISPOSITION:  Discharged to home.  FOLLOWUP:  In 10 days with Dr. Laurann Montana. Dictated by:   Delanna Ahmadi, M.D. Attending Physician:  Irven Shelling DD:  02/02/02 TD:  02/03/02 Job: [id number]

## 2011-07-17 LAB — COMPREHENSIVE METABOLIC PANEL
AST: 26
Albumin: 3.3 — ABNORMAL LOW
Alkaline Phosphatase: 97
BUN: 31 — ABNORMAL HIGH
CO2: 21
Chloride: 110
Creatinine, Ser: 1.71 — ABNORMAL HIGH
GFR calc Af Amer: 35 — ABNORMAL LOW
GFR calc non Af Amer: 29 — ABNORMAL LOW
Potassium: 5.2 — ABNORMAL HIGH
Total Bilirubin: 0.3

## 2011-07-17 LAB — CBC
HCT: 34.2 — ABNORMAL LOW
HCT: 27.3 — ABNORMAL LOW
HCT: 28.8 — ABNORMAL LOW
Hemoglobin: 9 — ABNORMAL LOW
Hemoglobin: 9.4 — ABNORMAL LOW
MCHC: 33
MCV: 88.3
MCV: 88.6
MCV: 88.8
Platelets: 220
RBC: 3.87
RBC: 3.24 — ABNORMAL LOW
RDW: 15.7 — ABNORMAL HIGH
WBC: 5.9
WBC: 5.6

## 2011-07-17 LAB — I-STAT 8, (EC8 V) (CONVERTED LAB)
Acid-base deficit: 5 — ABNORMAL HIGH
Bicarbonate: 21
Hemoglobin: 13.6
Operator id: 257131
Potassium: 4.8
Sodium: 133 — ABNORMAL LOW
TCO2: 22

## 2011-07-17 LAB — BASIC METABOLIC PANEL
BUN: 12
CO2: 19
Chloride: 114 — ABNORMAL HIGH
Chloride: 119 — ABNORMAL HIGH
GFR calc non Af Amer: 42 — ABNORMAL LOW
GFR calc non Af Amer: 47 — ABNORMAL LOW
Glucose, Bld: 86
Potassium: 4.2
Potassium: 4.5
Sodium: 138
Sodium: 142

## 2011-07-17 LAB — FOLATE RBC: RBC Folate: 249

## 2011-07-17 LAB — CARDIAC PANEL(CRET KIN+CKTOT+MB+TROPI): CK, MB: 2

## 2011-07-17 LAB — TSH: TSH: 1.89

## 2011-07-17 LAB — B-NATRIURETIC PEPTIDE (CONVERTED LAB): Pro B Natriuretic peptide (BNP): <30

## 2011-07-17 LAB — IRON AND TIBC: Saturation Ratios: 26

## 2011-07-17 LAB — PROTIME-INR: Prothrombin Time: 13.6

## 2011-07-29 LAB — GLUCOSE, CAPILLARY: Glucose-Capillary: 96

## 2011-11-12 DIAGNOSIS — L84 Corns and callosities: Secondary | ICD-10-CM | POA: Diagnosis not present

## 2011-11-12 DIAGNOSIS — M79609 Pain in unspecified limb: Secondary | ICD-10-CM | POA: Diagnosis not present

## 2011-11-12 DIAGNOSIS — E1159 Type 2 diabetes mellitus with other circulatory complications: Secondary | ICD-10-CM | POA: Diagnosis not present

## 2012-01-19 DIAGNOSIS — K5900 Constipation, unspecified: Secondary | ICD-10-CM | POA: Diagnosis not present

## 2012-01-19 DIAGNOSIS — F0390 Unspecified dementia without behavioral disturbance: Secondary | ICD-10-CM | POA: Diagnosis not present

## 2012-01-19 DIAGNOSIS — N318 Other neuromuscular dysfunction of bladder: Secondary | ICD-10-CM | POA: Diagnosis not present

## 2012-01-19 DIAGNOSIS — E119 Type 2 diabetes mellitus without complications: Secondary | ICD-10-CM | POA: Diagnosis not present

## 2012-02-21 ENCOUNTER — Emergency Department (HOSPITAL_COMMUNITY)
Admission: EM | Admit: 2012-02-21 | Discharge: 2012-02-21 | Disposition: A | Discharge status: Home / Self Care | Payer: Medicare Other | Attending: Emergency Medicine | Admitting: Emergency Medicine

## 2012-02-21 ENCOUNTER — Encounter (HOSPITAL_COMMUNITY): Payer: Self-pay | Admitting: *Deleted

## 2012-02-21 DIAGNOSIS — I509 Heart failure, unspecified: Secondary | ICD-10-CM | POA: Insufficient documentation

## 2012-02-21 DIAGNOSIS — N390 Urinary tract infection, site not specified: Secondary | ICD-10-CM | POA: Diagnosis not present

## 2012-02-21 DIAGNOSIS — R300 Dysuria: Secondary | ICD-10-CM | POA: Insufficient documentation

## 2012-02-21 DIAGNOSIS — H409 Unspecified glaucoma: Secondary | ICD-10-CM | POA: Insufficient documentation

## 2012-02-21 DIAGNOSIS — M549 Dorsalgia, unspecified: Secondary | ICD-10-CM | POA: Diagnosis not present

## 2012-02-21 DIAGNOSIS — E119 Type 2 diabetes mellitus without complications: Secondary | ICD-10-CM | POA: Diagnosis not present

## 2012-02-21 DIAGNOSIS — R109 Unspecified abdominal pain: Secondary | ICD-10-CM | POA: Diagnosis not present

## 2012-02-21 DIAGNOSIS — I10 Essential (primary) hypertension: Secondary | ICD-10-CM | POA: Diagnosis not present

## 2012-02-21 DIAGNOSIS — F0390 Unspecified dementia without behavioral disturbance: Secondary | ICD-10-CM | POA: Insufficient documentation

## 2012-02-21 HISTORY — DX: Dizziness and giddiness: R42

## 2012-02-21 HISTORY — DX: Disorder of kidney and ureter, unspecified: N28.9

## 2012-02-21 HISTORY — DX: Dorsalgia, unspecified: M54.9

## 2012-02-21 HISTORY — DX: Other chronic pain: G89.29

## 2012-02-21 HISTORY — DX: Heart failure, unspecified: I50.9

## 2012-02-21 HISTORY — DX: Postnasal drip: R09.82

## 2012-02-21 HISTORY — DX: Reserved for concepts with insufficient information to code with codable children: IMO0002

## 2012-02-21 HISTORY — DX: Other seasonal allergic rhinitis: J30.2

## 2012-02-21 HISTORY — DX: Essential (primary) hypertension: I10

## 2012-02-21 HISTORY — DX: Ventral hernia without obstruction or gangrene: K43.9

## 2012-02-21 HISTORY — DX: Unspecified dementia, unspecified severity, without behavioral disturbance, psychotic disturbance, mood disturbance, and anxiety: F03.90

## 2012-02-21 LAB — URINALYSIS, ROUTINE W REFLEX MICROSCOPIC
Bilirubin Urine: NEGATIVE
Nitrite: POSITIVE — AB
Protein, ur: NEGATIVE mg/dL
Urobilinogen, UA: 0.2 mg/dL (ref 0.0–1.0)

## 2012-02-21 LAB — CBC
Platelets: 215 10*3/uL (ref 150–400)
RBC: 4.24 MIL/uL (ref 3.87–5.11)
WBC: 6.7 10*3/uL (ref 4.0–10.5)

## 2012-02-21 LAB — DIFFERENTIAL
Lymphocytes Relative: 41 % (ref 12–46)
Lymphs Abs: 2.8 10*3/uL (ref 0.7–4.0)
Neutrophils Relative %: 48 % (ref 43–77)

## 2012-02-21 LAB — COMPREHENSIVE METABOLIC PANEL
Alkaline Phosphatase: 109 U/L (ref 39–117)
BUN: 28 mg/dL — ABNORMAL HIGH (ref 6–23)
Calcium: 9.5 mg/dL (ref 8.4–10.5)
GFR calc Af Amer: 40 mL/min — ABNORMAL LOW (ref >90)
Glucose, Bld: 84 mg/dL (ref 70–99)
Total Protein: 7.1 g/dL (ref 6.0–8.3)

## 2012-02-21 LAB — URINE MICROSCOPIC-ADD ON

## 2012-02-21 MED ORDER — DEXTROSE 5 % IV SOLN
1.0000 g | INTRAVENOUS | Status: DC
  Administered 2012-02-21: 1 g via INTRAVENOUS
  Filled 2012-02-21: qty 10

## 2012-02-21 MED ORDER — SODIUM CHLORIDE 0.9 % IV SOLN: Freq: Once | INTRAVENOUS | Status: DC

## 2012-02-21 MED ORDER — CEPHALEXIN 500 MG PO CAPS: 500.0000 mg | ORAL_CAPSULE | Freq: Four times a day (QID) | ORAL | Status: AC

## 2012-02-21 NOTE — ED Notes (Signed)
Pt reports right sided flank pain and also right back pain starting about a week ago.  Pt has a history of back pain and sciatica on the right side. Pt reports pain is different and more severe than chronic back pain issues. Pt denies injury or new exercise. Pt denies burning with urination or increased frequency, but pt has hx of dementia and prolapse- pt daughter states the patient would most likely not notice a change.  Pt has tried ibuprofen, aleve, tylenol and they did not improve pt pain.  Pt reports pain is intermittent lasting all night the previous night.  Pt reports pain does not correlate with movement.  Pt reports pain has improved at this time.
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
## 2012-02-21 NOTE — Discharge Instructions (Signed)
Urinary Tract Infection Infections of the urinary tract can start in several places. A bladder infection (cystitis), a kidney infection (pyelonephritis), and a prostate infection (prostatitis) are different types of urinary tract infections (UTIs). They usually get better if treated with medicines (antibiotics) that kill germs. Take all the medicine until it is gone. You or your child may feel better in a few days, but TAKE ALL MEDICINE or the infection may not respond and may become more difficult to treat. HOME CARE INSTRUCTIONS   Drink enough water and fluids to keep the urine clear or pale yellow. Cranberry juice is especially recommended, in addition to large amounts of water.   Avoid caffeine, tea, and carbonated beverages. They tend to irritate the bladder.   Alcohol may irritate the prostate.   Only take over-the-counter or prescription medicines for pain, discomfort, or fever as directed by your caregiver.  To prevent further infections:  Empty the bladder often. Avoid holding urine for long periods of time.   After a bowel movement, women should cleanse from front to back. Use each tissue only once.   Empty the bladder before and after sexual intercourse.  FINDING OUT THE RESULTS OF YOUR TEST Not all test results are available during your visit. If your or your child's test results are not back during the visit, make an appointment with your caregiver to find out the results. Do not assume everything is normal if you have not heard from your caregiver or the medical facility. It is important for you to follow up on all test results. SEEK MEDICAL CARE IF:   There is back pain.   Your baby is older than 3 months with a rectal temperature of 100.5 F (38.1 C) or higher for more than 1 day.   Your or your child's problems (symptoms) are no better in 3 days. Return sooner if you or your child is getting worse.  SEEK IMMEDIATE MEDICAL CARE IF:   There is severe back pain or lower  abdominal pain.   You or your child develops chills.   You have a fever.   Your baby is older than 3 months with a rectal temperature of 102 F (38.9 C) or higher.   Your baby is 3 months old or younger with a rectal temperature of 100.4 F (38 C) or higher.   There is nausea or vomiting.   There is continued burning or discomfort with urination.  MAKE SURE YOU:   Understand these instructions.   Will watch your condition.   Will get help right away if you are not doing well or get worse.  Document Released: 07/23/2005 Document Revised: 10/02/2011 Document Reviewed: 02/25/2007 Exit[employer] Patient Information 2012 [employer]. 

## 2012-02-23 LAB — URINE CULTURE
Colony Count: >=100000
Culture  Setup Time: 201304271718

## 2012-02-24 NOTE — ED Notes (Signed)
+   urine Patient treated with Keflex-sensitive to same

## 2012-02-27 DIAGNOSIS — M5430 Sciatica, unspecified side: Secondary | ICD-10-CM | POA: Diagnosis not present

## 2012-02-27 DIAGNOSIS — M545 Low back pain: Secondary | ICD-10-CM | POA: Diagnosis not present

## 2012-07-22 DIAGNOSIS — M109 Gout, unspecified: Secondary | ICD-10-CM | POA: Diagnosis not present

## 2012-07-22 DIAGNOSIS — F329 Major depressive disorder, single episode, unspecified: Secondary | ICD-10-CM | POA: Diagnosis not present

## 2012-07-22 DIAGNOSIS — Z23 Encounter for immunization: Secondary | ICD-10-CM | POA: Diagnosis not present

## 2012-07-22 DIAGNOSIS — H5316 Psychophysical visual disturbances: Secondary | ICD-10-CM | POA: Diagnosis not present

## 2012-07-22 DIAGNOSIS — E119 Type 2 diabetes mellitus without complications: Secondary | ICD-10-CM | POA: Diagnosis not present

## 2012-07-22 DIAGNOSIS — F0390 Unspecified dementia without behavioral disturbance: Secondary | ICD-10-CM | POA: Diagnosis not present

## 2012-07-22 DIAGNOSIS — E785 Hyperlipidemia, unspecified: Secondary | ICD-10-CM | POA: Diagnosis not present

## 2012-07-22 DIAGNOSIS — Z1331 Encounter for screening for depression: Secondary | ICD-10-CM | POA: Diagnosis not present

## 2012-07-22 DIAGNOSIS — Z0000 Encounter for general adult medical examination without abnormal findings: Secondary | ICD-10-CM | POA: Diagnosis not present

## 2012-07-28 DIAGNOSIS — G609 Hereditary and idiopathic neuropathy, unspecified: Secondary | ICD-10-CM | POA: Diagnosis not present

## 2012-07-28 DIAGNOSIS — N952 Postmenopausal atrophic vaginitis: Secondary | ICD-10-CM | POA: Diagnosis not present

## 2012-07-28 DIAGNOSIS — N319 Neuromuscular dysfunction of bladder, unspecified: Secondary | ICD-10-CM | POA: Diagnosis not present

## 2012-07-28 DIAGNOSIS — N3946 Mixed incontinence: Secondary | ICD-10-CM | POA: Diagnosis not present

## 2012-08-05 DIAGNOSIS — N390 Urinary tract infection, site not specified: Secondary | ICD-10-CM | POA: Diagnosis not present

## 2012-08-05 DIAGNOSIS — B373 Candidiasis of vulva and vagina: Secondary | ICD-10-CM | POA: Diagnosis not present

## 2012-08-05 DIAGNOSIS — N952 Postmenopausal atrophic vaginitis: Secondary | ICD-10-CM | POA: Diagnosis not present

## 2012-08-06 DIAGNOSIS — F068 Other specified mental disorders due to known physiological condition: Secondary | ICD-10-CM | POA: Diagnosis not present

## 2012-08-09 DIAGNOSIS — M810 Age-related osteoporosis without current pathological fracture: Secondary | ICD-10-CM | POA: Diagnosis not present

## 2012-08-11 DIAGNOSIS — M5380 Other specified dorsopathies, site unspecified: Secondary | ICD-10-CM | POA: Diagnosis not present

## 2012-08-11 DIAGNOSIS — M999 Biomechanical lesion, unspecified: Secondary | ICD-10-CM | POA: Diagnosis not present

## 2012-08-11 DIAGNOSIS — S239XXA Sprain of unspecified parts of thorax, initial encounter: Secondary | ICD-10-CM | POA: Diagnosis not present

## 2012-08-11 DIAGNOSIS — M9981 Other biomechanical lesions of cervical region: Secondary | ICD-10-CM | POA: Diagnosis not present

## 2012-08-11 DIAGNOSIS — S139XXA Sprain of joints and ligaments of unspecified parts of neck, initial encounter: Secondary | ICD-10-CM | POA: Diagnosis not present

## 2012-08-13 DIAGNOSIS — M999 Biomechanical lesion, unspecified: Secondary | ICD-10-CM | POA: Diagnosis not present

## 2012-08-13 DIAGNOSIS — S239XXA Sprain of unspecified parts of thorax, initial encounter: Secondary | ICD-10-CM | POA: Diagnosis not present

## 2012-08-13 DIAGNOSIS — M5380 Other specified dorsopathies, site unspecified: Secondary | ICD-10-CM | POA: Diagnosis not present

## 2012-08-13 DIAGNOSIS — M9981 Other biomechanical lesions of cervical region: Secondary | ICD-10-CM | POA: Diagnosis not present

## 2012-08-13 DIAGNOSIS — S139XXA Sprain of joints and ligaments of unspecified parts of neck, initial encounter: Secondary | ICD-10-CM | POA: Diagnosis not present

## 2012-08-16 DIAGNOSIS — S139XXA Sprain of joints and ligaments of unspecified parts of neck, initial encounter: Secondary | ICD-10-CM | POA: Diagnosis not present

## 2012-08-16 DIAGNOSIS — M999 Biomechanical lesion, unspecified: Secondary | ICD-10-CM | POA: Diagnosis not present

## 2012-08-16 DIAGNOSIS — M5380 Other specified dorsopathies, site unspecified: Secondary | ICD-10-CM | POA: Diagnosis not present

## 2012-08-16 DIAGNOSIS — M9981 Other biomechanical lesions of cervical region: Secondary | ICD-10-CM | POA: Diagnosis not present

## 2012-08-16 DIAGNOSIS — S239XXA Sprain of unspecified parts of thorax, initial encounter: Secondary | ICD-10-CM | POA: Diagnosis not present

## 2012-08-18 DIAGNOSIS — M5380 Other specified dorsopathies, site unspecified: Secondary | ICD-10-CM | POA: Diagnosis not present

## 2012-08-18 DIAGNOSIS — S139XXA Sprain of joints and ligaments of unspecified parts of neck, initial encounter: Secondary | ICD-10-CM | POA: Diagnosis not present

## 2012-08-18 DIAGNOSIS — S239XXA Sprain of unspecified parts of thorax, initial encounter: Secondary | ICD-10-CM | POA: Diagnosis not present

## 2012-08-18 DIAGNOSIS — M999 Biomechanical lesion, unspecified: Secondary | ICD-10-CM | POA: Diagnosis not present

## 2012-08-18 DIAGNOSIS — M25519 Pain in unspecified shoulder: Secondary | ICD-10-CM | POA: Diagnosis not present

## 2012-08-18 DIAGNOSIS — M9981 Other biomechanical lesions of cervical region: Secondary | ICD-10-CM | POA: Diagnosis not present

## 2012-08-23 DIAGNOSIS — S239XXA Sprain of unspecified parts of thorax, initial encounter: Secondary | ICD-10-CM | POA: Diagnosis not present

## 2012-08-23 DIAGNOSIS — M999 Biomechanical lesion, unspecified: Secondary | ICD-10-CM | POA: Diagnosis not present

## 2012-08-23 DIAGNOSIS — M5380 Other specified dorsopathies, site unspecified: Secondary | ICD-10-CM | POA: Diagnosis not present

## 2012-08-23 DIAGNOSIS — S139XXA Sprain of joints and ligaments of unspecified parts of neck, initial encounter: Secondary | ICD-10-CM | POA: Diagnosis not present

## 2012-08-23 DIAGNOSIS — M9981 Other biomechanical lesions of cervical region: Secondary | ICD-10-CM | POA: Diagnosis not present

## 2012-08-25 DIAGNOSIS — M5380 Other specified dorsopathies, site unspecified: Secondary | ICD-10-CM | POA: Diagnosis not present

## 2012-08-25 DIAGNOSIS — S139XXA Sprain of joints and ligaments of unspecified parts of neck, initial encounter: Secondary | ICD-10-CM | POA: Diagnosis not present

## 2012-08-25 DIAGNOSIS — M9981 Other biomechanical lesions of cervical region: Secondary | ICD-10-CM | POA: Diagnosis not present

## 2012-08-25 DIAGNOSIS — S239XXA Sprain of unspecified parts of thorax, initial encounter: Secondary | ICD-10-CM | POA: Diagnosis not present

## 2012-08-25 DIAGNOSIS — M999 Biomechanical lesion, unspecified: Secondary | ICD-10-CM | POA: Diagnosis not present

## 2012-08-30 DIAGNOSIS — M5380 Other specified dorsopathies, site unspecified: Secondary | ICD-10-CM | POA: Diagnosis not present

## 2012-08-30 DIAGNOSIS — M9981 Other biomechanical lesions of cervical region: Secondary | ICD-10-CM | POA: Diagnosis not present

## 2012-08-30 DIAGNOSIS — S139XXA Sprain of joints and ligaments of unspecified parts of neck, initial encounter: Secondary | ICD-10-CM | POA: Diagnosis not present

## 2012-08-30 DIAGNOSIS — M999 Biomechanical lesion, unspecified: Secondary | ICD-10-CM | POA: Diagnosis not present

## 2012-08-30 DIAGNOSIS — S239XXA Sprain of unspecified parts of thorax, initial encounter: Secondary | ICD-10-CM | POA: Diagnosis not present

## 2012-09-01 DIAGNOSIS — M9981 Other biomechanical lesions of cervical region: Secondary | ICD-10-CM | POA: Diagnosis not present

## 2012-09-01 DIAGNOSIS — M999 Biomechanical lesion, unspecified: Secondary | ICD-10-CM | POA: Diagnosis not present

## 2012-09-01 DIAGNOSIS — S139XXA Sprain of joints and ligaments of unspecified parts of neck, initial encounter: Secondary | ICD-10-CM | POA: Diagnosis not present

## 2012-09-01 DIAGNOSIS — M5380 Other specified dorsopathies, site unspecified: Secondary | ICD-10-CM | POA: Diagnosis not present

## 2012-09-01 DIAGNOSIS — S239XXA Sprain of unspecified parts of thorax, initial encounter: Secondary | ICD-10-CM | POA: Diagnosis not present

## 2012-09-14 DIAGNOSIS — M25559 Pain in unspecified hip: Secondary | ICD-10-CM | POA: Diagnosis not present

## 2012-09-14 DIAGNOSIS — M999 Biomechanical lesion, unspecified: Secondary | ICD-10-CM | POA: Diagnosis not present

## 2012-09-14 DIAGNOSIS — N3000 Acute cystitis without hematuria: Secondary | ICD-10-CM | POA: Diagnosis not present

## 2012-09-14 DIAGNOSIS — M5137 Other intervertebral disc degeneration, lumbosacral region: Secondary | ICD-10-CM | POA: Diagnosis not present

## 2012-09-14 DIAGNOSIS — N3020 Other chronic cystitis without hematuria: Secondary | ICD-10-CM | POA: Diagnosis not present

## 2012-09-14 DIAGNOSIS — N952 Postmenopausal atrophic vaginitis: Secondary | ICD-10-CM | POA: Diagnosis not present

## 2012-09-16 DIAGNOSIS — M5137 Other intervertebral disc degeneration, lumbosacral region: Secondary | ICD-10-CM | POA: Diagnosis not present

## 2012-09-16 DIAGNOSIS — M25559 Pain in unspecified hip: Secondary | ICD-10-CM | POA: Diagnosis not present

## 2012-09-16 DIAGNOSIS — M999 Biomechanical lesion, unspecified: Secondary | ICD-10-CM | POA: Diagnosis not present

## 2012-09-20 DIAGNOSIS — M999 Biomechanical lesion, unspecified: Secondary | ICD-10-CM | POA: Diagnosis not present

## 2012-09-20 DIAGNOSIS — M5137 Other intervertebral disc degeneration, lumbosacral region: Secondary | ICD-10-CM | POA: Diagnosis not present

## 2012-09-20 DIAGNOSIS — M25559 Pain in unspecified hip: Secondary | ICD-10-CM | POA: Diagnosis not present

## 2012-09-21 DIAGNOSIS — M999 Biomechanical lesion, unspecified: Secondary | ICD-10-CM | POA: Diagnosis not present

## 2012-09-21 DIAGNOSIS — M5137 Other intervertebral disc degeneration, lumbosacral region: Secondary | ICD-10-CM | POA: Diagnosis not present

## 2012-09-21 DIAGNOSIS — M25559 Pain in unspecified hip: Secondary | ICD-10-CM | POA: Diagnosis not present

## 2012-09-30 ENCOUNTER — Observation Stay (HOSPITAL_COMMUNITY): Payer: Medicare Other

## 2012-09-30 ENCOUNTER — Encounter (HOSPITAL_COMMUNITY): Payer: Self-pay | Admitting: Emergency Medicine

## 2012-09-30 ENCOUNTER — Inpatient Hospital Stay (HOSPITAL_COMMUNITY)
Admission: EM | Admit: 2012-09-30 | Discharge: 2012-10-05 | DRG: 683 | Disposition: A | Discharge status: Home / Self Care | Payer: Medicare Other | Attending: Internal Medicine | Admitting: Internal Medicine

## 2012-09-30 DIAGNOSIS — F71 Moderate intellectual disabilities: Secondary | ICD-10-CM | POA: Diagnosis present

## 2012-09-30 DIAGNOSIS — H409 Unspecified glaucoma: Secondary | ICD-10-CM | POA: Diagnosis present

## 2012-09-30 DIAGNOSIS — I517 Cardiomegaly: Secondary | ICD-10-CM | POA: Diagnosis not present

## 2012-09-30 DIAGNOSIS — I509 Heart failure, unspecified: Secondary | ICD-10-CM | POA: Diagnosis present

## 2012-09-30 DIAGNOSIS — E860 Dehydration: Secondary | ICD-10-CM | POA: Insufficient documentation

## 2012-09-30 DIAGNOSIS — N189 Chronic kidney disease, unspecified: Secondary | ICD-10-CM | POA: Diagnosis present

## 2012-09-30 DIAGNOSIS — I129 Hypertensive chronic kidney disease with stage 1 through stage 4 chronic kidney disease, or unspecified chronic kidney disease: Secondary | ICD-10-CM | POA: Diagnosis present

## 2012-09-30 DIAGNOSIS — I10 Essential (primary) hypertension: Secondary | ICD-10-CM | POA: Diagnosis present

## 2012-09-30 DIAGNOSIS — Z79899 Other long term (current) drug therapy: Secondary | ICD-10-CM | POA: Diagnosis not present

## 2012-09-30 DIAGNOSIS — M549 Dorsalgia, unspecified: Secondary | ICD-10-CM | POA: Diagnosis present

## 2012-09-30 DIAGNOSIS — N179 Acute kidney failure, unspecified: Secondary | ICD-10-CM | POA: Diagnosis not present

## 2012-09-30 DIAGNOSIS — N390 Urinary tract infection, site not specified: Secondary | ICD-10-CM | POA: Diagnosis present

## 2012-09-30 DIAGNOSIS — R5381 Other malaise: Secondary | ICD-10-CM | POA: Diagnosis present

## 2012-09-30 DIAGNOSIS — F0280 Dementia in other diseases classified elsewhere without behavioral disturbance: Secondary | ICD-10-CM | POA: Diagnosis present

## 2012-09-30 DIAGNOSIS — B3731 Acute candidiasis of vulva and vagina: Secondary | ICD-10-CM | POA: Diagnosis present

## 2012-09-30 DIAGNOSIS — E119 Type 2 diabetes mellitus without complications: Secondary | ICD-10-CM | POA: Diagnosis not present

## 2012-09-30 DIAGNOSIS — R404 Transient alteration of awareness: Secondary | ICD-10-CM | POA: Diagnosis not present

## 2012-09-30 DIAGNOSIS — B373 Candidiasis of vulva and vagina: Secondary | ICD-10-CM | POA: Diagnosis present

## 2012-09-30 DIAGNOSIS — R531 Weakness: Secondary | ICD-10-CM | POA: Diagnosis present

## 2012-09-30 DIAGNOSIS — F0390 Unspecified dementia without behavioral disturbance: Secondary | ICD-10-CM | POA: Diagnosis present

## 2012-09-30 DIAGNOSIS — R5383 Other fatigue: Secondary | ICD-10-CM | POA: Diagnosis not present

## 2012-09-30 DIAGNOSIS — Z452 Encounter for adjustment and management of vascular access device: Secondary | ICD-10-CM | POA: Diagnosis not present

## 2012-09-30 DIAGNOSIS — I5189 Other ill-defined heart diseases: Secondary | ICD-10-CM | POA: Insufficient documentation

## 2012-09-30 DIAGNOSIS — Z87891 Personal history of nicotine dependence: Secondary | ICD-10-CM

## 2012-09-30 DIAGNOSIS — I5032 Chronic diastolic (congestive) heart failure: Secondary | ICD-10-CM | POA: Diagnosis present

## 2012-09-30 LAB — CBC WITH DIFFERENTIAL/PLATELET
Basophils Absolute: 0.1 10*3/uL (ref 0.0–0.1)
HCT: 36.9 % (ref 36.0–46.0)
Hemoglobin: 12.3 g/dL (ref 12.0–15.0)
Lymphocytes Relative: 36 % (ref 12–46)
Monocytes Relative: 14 % — ABNORMAL HIGH (ref 3–12)
Neutro Abs: 1.3 10*3/uL — ABNORMAL LOW (ref 1.7–7.7)
RDW: 15.4 % (ref 11.5–15.5)
WBC: 3.3 10*3/uL — ABNORMAL LOW (ref 4.0–10.5)

## 2012-09-30 LAB — URINE MICROSCOPIC-ADD ON

## 2012-09-30 LAB — URINALYSIS, ROUTINE W REFLEX MICROSCOPIC
Nitrite: NEGATIVE
Specific Gravity, Urine: 1.02 (ref 1.005–1.030)
pH: 6.5 (ref 5.0–8.0)

## 2012-09-30 LAB — BASIC METABOLIC PANEL
BUN: 32 mg/dL — ABNORMAL HIGH (ref 6–23)
Chloride: 101 mEq/L (ref 96–112)
GFR calc Af Amer: 19 mL/min — ABNORMAL LOW (ref >90)
Potassium: 3.6 mEq/L (ref 3.5–5.1)
Sodium: 137 mEq/L (ref 135–145)

## 2012-09-30 LAB — TROPONIN I: Troponin I: <0.3 ng/mL (ref <0.30)

## 2012-09-30 MED ORDER — CLOTRIMAZOLE 1 % VA CREA
1.0000 | TOPICAL_CREAM | Freq: Every day | VAGINAL | Status: DC
  Administered 2012-09-30 – 2012-10-04 (×4): 1 via VAGINAL
  Filled 2012-09-30: qty 45

## 2012-09-30 MED ORDER — SODIUM CHLORIDE 0.9 % IV SOLN
INTRAVENOUS | Status: DC
  Administered 2012-09-30: 21:00:00 via INTRAVENOUS

## 2012-09-30 MED ORDER — BRIMONIDINE TARTRATE 0.2 % OP SOLN
1.0000 [drp] | Freq: Every day | OPHTHALMIC | Status: DC
  Administered 2012-09-30 – 2012-10-05 (×6): 1 [drp] via OPHTHALMIC
  Filled 2012-09-30 (×2): qty 5

## 2012-09-30 MED ORDER — SODIUM CHLORIDE 0.9 % IV SOLN
INTRAVENOUS | Status: AC
  Administered 2012-09-30: 17:00:00 via INTRAVENOUS

## 2012-09-30 MED ORDER — SODIUM CHLORIDE 0.9 % IV BOLUS (SEPSIS)
1000.0000 mL | Freq: Once | INTRAVENOUS | Status: AC
  Administered 2012-09-30: 1000 mL via INTRAVENOUS

## 2012-09-30 MED ORDER — BENZOCAINE-RESORCINOL 5-2 % VA CREA: TOPICAL_CREAM | Freq: Every day | VAGINAL | Status: DC

## 2012-09-30 MED ORDER — SENNOSIDES 8.6 MG PO TABS
2.0000 | ORAL_TABLET | Freq: Every day | ORAL | Status: DC
  Administered 2012-09-30 – 2012-10-05 (×6): 17.2 mg via ORAL
  Filled 2012-09-30 (×6): qty 2

## 2012-09-30 MED ORDER — FLUCONAZOLE 150 MG PO TABS
150.0000 mg | ORAL_TABLET | Freq: Every day | ORAL | Status: DC
  Administered 2012-09-30: 150 mg via ORAL
  Filled 2012-09-30 (×2): qty 1

## 2012-09-30 MED ORDER — ALLOPURINOL 100 MG PO TABS
50.0000 mg | ORAL_TABLET | Freq: Every day | ORAL | Status: DC
  Filled 2012-09-30: qty 0.5

## 2012-09-30 MED ORDER — MEMANTINE HCL 10 MG PO TABS
10.0000 mg | ORAL_TABLET | Freq: Two times a day (BID) | ORAL | Status: DC
  Administered 2012-09-30 – 2012-10-05 (×10): 10 mg via ORAL
  Filled 2012-09-30 (×11): qty 1

## 2012-09-30 MED ORDER — BRIMONIDINE TARTRATE 0.1 % OP SOLN: 1.0000 [drp] | Freq: Every day | OPHTHALMIC | Status: DC

## 2012-09-30 MED ORDER — BISACODYL 5 MG PO TBEC
15.0000 mg | DELAYED_RELEASE_TABLET | Freq: Every evening | ORAL | Status: DC
  Administered 2012-09-30 – 2012-10-04 (×4): 15 mg via ORAL
  Filled 2012-09-30: qty 2
  Filled 2012-09-30 (×4): qty 3
  Filled 2012-09-30: qty 1

## 2012-09-30 MED ORDER — LATANOPROST 0.005 % OP SOLN
1.0000 [drp] | Freq: Every day | OPHTHALMIC | Status: DC
  Administered 2012-09-30 – 2012-10-04 (×5): 1 [drp] via OPHTHALMIC
  Filled 2012-09-30 (×2): qty 2.5

## 2012-09-30 MED ORDER — LORATADINE 10 MG PO TABS
10.0000 mg | ORAL_TABLET | Freq: Every day | ORAL | Status: DC
  Administered 2012-10-01 – 2012-10-05 (×5): 10 mg via ORAL
  Filled 2012-09-30 (×5): qty 1

## 2012-09-30 MED ORDER — INSULIN ASPART 100 UNIT/ML ~~LOC~~ SOLN
0.0000 [IU] | Freq: Three times a day (TID) | SUBCUTANEOUS | Status: DC
  Administered 2012-10-01: 2 [IU] via SUBCUTANEOUS

## 2012-09-30 MED ORDER — DEXTROSE 5 % IV SOLN
1.0000 g | Freq: Once | INTRAVENOUS | Status: AC
  Administered 2012-09-30: 1 g via INTRAVENOUS
  Filled 2012-09-30: qty 10

## 2012-09-30 MED ORDER — CEFTRIAXONE SODIUM 1 G IJ SOLR
1.0000 g | INTRAMUSCULAR | Status: DC
  Administered 2012-10-01 – 2012-10-02 (×2): 1 g via INTRAVENOUS
  Filled 2012-09-30 (×2): qty 10

## 2012-09-30 MED ORDER — ATORVASTATIN CALCIUM 10 MG PO TABS
10.0000 mg | ORAL_TABLET | Freq: Every day | ORAL | Status: DC
  Administered 2012-10-01 – 2012-10-05 (×5): 10 mg via ORAL
  Filled 2012-09-30 (×5): qty 1

## 2012-09-30 MED ORDER — HEPARIN SODIUM (PORCINE) 5000 UNIT/ML IJ SOLN
5000.0000 [IU] | Freq: Three times a day (TID) | INTRAMUSCULAR | Status: DC
  Administered 2012-09-30 – 2012-10-04 (×8): 5000 [IU] via SUBCUTANEOUS
  Filled 2012-09-30 (×14): qty 1

## 2012-09-30 MED ORDER — FLUTICASONE PROPIONATE 50 MCG/ACT NA SUSP
2.0000 | Freq: Every day | NASAL | Status: DC
  Administered 2012-10-02 – 2012-10-05 (×4): 2 via NASAL
  Filled 2012-09-30 (×2): qty 16

## 2012-09-30 NOTE — ED Provider Notes (Signed)
Medical screening examination/treatment/procedure(s) were conducted as a shared visit with non-physician practitioner(s) and myself.  I personally evaluated the patient during the encounter  Varney Biles, MD 09/30/12 1626

## 2012-09-30 NOTE — ED Notes (Signed)
Patient to radiology at this time.
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
## 2012-09-30 NOTE — ED Notes (Addendum)
Per EMS, pt comes from home, hx dementia, VSS, BP 118/70, HR 74. Pt has had generalized weakness since Thanksgiving, getting significantly worse since 2 days ago. Completed UTI tx today, but still being treated for yeast infection.

## 2012-09-30 NOTE — ED Notes (Signed)
EDP at bedside, lungs clear, abdomen soft, vaginal yeast present and visible.

## 2012-09-30 NOTE — ED Provider Notes (Signed)
The patient remains comfortable, no complaints. She is pleasantly demented, abd nontender and soft, follows command, lungs clear, RRR.   She has evidence of UTI and worsening renal failure, along with presenting symptoms of weakness and fatigue. Admit.  Leotis Shames, PA-C 09/30/12 1554

## 2012-09-30 NOTE — ED Provider Notes (Signed)
Medical screening examination/treatment/procedure(s) were conducted as a shared visit with non-physician practitioner(s) and myself.  I personally evaluated the patient during the encounter  Varney Biles, MD 09/30/12 1627

## 2012-09-30 NOTE — ED Notes (Signed)
Per EMS, CBG 74, pt alert, able to ambulate to stretcher with assistance.

## 2012-10-01 DIAGNOSIS — I10 Essential (primary) hypertension: Secondary | ICD-10-CM | POA: Diagnosis not present

## 2012-10-01 DIAGNOSIS — N390 Urinary tract infection, site not specified: Secondary | ICD-10-CM | POA: Diagnosis not present

## 2012-10-01 DIAGNOSIS — N179 Acute kidney failure, unspecified: Secondary | ICD-10-CM | POA: Diagnosis not present

## 2012-10-01 DIAGNOSIS — F0390 Unspecified dementia without behavioral disturbance: Secondary | ICD-10-CM | POA: Diagnosis not present

## 2012-10-01 LAB — CBC
HCT: 34.2 % — ABNORMAL LOW (ref 36.0–46.0)
Hemoglobin: 11.4 g/dL — ABNORMAL LOW (ref 12.0–15.0)
RBC: 4.08 MIL/uL (ref 3.87–5.11)
RDW: 15.4 % (ref 11.5–15.5)
WBC: 3.8 10*3/uL — ABNORMAL LOW (ref 4.0–10.5)

## 2012-10-01 LAB — BASIC METABOLIC PANEL
BUN: 25 mg/dL — ABNORMAL HIGH (ref 6–23)
CO2: 19 mEq/L (ref 19–32)
Chloride: 109 mEq/L (ref 96–112)
GFR calc Af Amer: 24 mL/min — ABNORMAL LOW (ref >90)
Potassium: 3.6 mEq/L (ref 3.5–5.1)

## 2012-10-01 LAB — GLUCOSE, CAPILLARY
Glucose-Capillary: 136 mg/dL — ABNORMAL HIGH (ref 70–99)
Glucose-Capillary: 99 mg/dL (ref 70–99)

## 2012-10-01 MED ORDER — ALLOPURINOL 150 MG HALF TABLET
150.0000 mg | ORAL_TABLET | Freq: Every day | ORAL | Status: DC
  Administered 2012-10-01: 150 mg via ORAL
  Administered 2012-10-02: 100 mg via ORAL
  Administered 2012-10-03 – 2012-10-05 (×3): 150 mg via ORAL
  Filled 2012-10-01 (×5): qty 1

## 2012-10-01 NOTE — Progress Notes (Signed)
Subjective: No complaints .  Objective: Vital signs in last 24 hours: Temp:  [98.1 F (36.7 C)-99.2 F (37.3 C)] 98.2 F (36.8 C) (12/06 0552) Pulse Rate:  [67-72] 67  (12/06 0552) Resp:  [12-17] 17  (12/06 0552) BP: (101-141)/(46-61) 141/61 mmHg (12/06 0552) SpO2:  [98 %-100 %] 100 % (12/06 0552) Weight:  [97.16 kg (214 lb 3.2 oz)] 97.16 kg (214 lb 3.2 oz) (12/05 2111) Weight change:  Last BM Date: 09/29/12  Intake/Output from previous day: 12/05 0701 - 12/06 0700 In: 1133.3 [P.O.:240; I.V.:893.3] Out: -  Intake/Output this shift:    General appearance: alert and cooperative Resp: clear to auscultation bilaterally Cardio: regular rate and rhythm, S1, S2 normal, no murmur, click, rub or gallop GI: soft, non-tender; bowel sounds normal; no masses,  no organomegaly Extremities: extremities normal, atraumatic, no cyanosis or edema  Lab Results:  Basename 10/01/12 0455 09/30/12 1150  WBC 3.8* 3.3*  HGB 11.4* 12.3  HCT 34.2* 36.9  PLT 140* 159   BMET  Basename 10/01/12 0455 09/30/12 1150  NA 139 137  K 3.6 3.6  CL 109 101  CO2 19 24  GLUCOSE 86 103*  BUN 25* 32*  CREATININE 2.12* 2.55*  CALCIUM 8.1* 8.6    Studies/Results: Dg Chest 2 View  09/30/2012  *RADIOLOGY REPORT*  Clinical Data: Generalized weakness, confusion  CHEST - 2 VIEW  Comparison: Chest x-ray of 06/20/2011  Findings: No active infiltrate or effusion is seen.  Cardiomegaly is stable.  There are degenerative changes throughout the thoracic spine and in both shoulders.  IMPRESSION: Stable cardiomegaly.  No active lung disease.   Original Report Authenticated By: Ivar Drape, M.D.     Medications: I have reviewed the patient's current medications.  Assessment/Plan: Principal Problem:  *Acute kidney injury improving with IVFs, creatinine baseline about 1.6.  Continue IVF. Active Problems:  Hypertension ok  Diabetes mellitus diet controlled, on SSI  Weakness generalized PT to see  UTI (urinary  tract infection) Rocephin 2, culture pending  Candidiasis of vagina diflucan X 1 and  terazol  Dementia on namenda  Chronic kidney disease baseline 1.6   LOS: 1 day   Daiton Cowles JOSEPH 10/01/2012, 7:30 AM

## 2012-10-01 NOTE — Evaluation (Signed)
Physical Therapy Evaluation Patient Details Name: Tanya Smith MRN: [id number] DOB: 04/13/1930 Today's Date: 10/01/2012 Time: [id number] PT Time Calculation (min): 30 min  PT Assessment / Plan / Recommendation Clinical Impression  Pt. was admitted with weakness, acute kidney injury, UTI, h/o dementia and DM and presents with need for assist in her mobility and gait.  Acute PT will benefit her to address her mobility issues.  It needs to be determined how much assist is available at home for pt but pt. is unable to provide this info due to her dementia.  No family present currently.  As long as adequate assist is available, pt. should be able to DC home with 24 hour care and probably HHPT.    PT Assessment  Patient needs continued PT services    Follow Up Recommendations  Home health PT;Supervision/Assistance - 24 hour;Supervision for mobility/OOB    Does the patient have the potential to tolerate intense rehabilitation      Barriers to Discharge Other (comment) (to be determined)      Equipment Recommendations  Rolling walker with 5" wheels;Other (comment) (unless one is in the home already)    Recommendations for Other Services     Frequency Min 3X/week    Precautions / Restrictions Precautions Precautions: Fall Restrictions Weight Bearing Restrictions: No   Pertinent Vitals/Pain No apin, no distress      Mobility  Bed Mobility Bed Mobility: Supine to Sit;Sit to Supine Supine to Sit: 4: Min guard;With rails;HOB elevated Sit to Supine: 4: Min assist;HOB flat Details for Bed Mobility Assistance: Able to manage self fairly wellwith use of rails and extra time, needed min assist to get LEs into bed Transfers Transfers: Sit to Stand;Stand to Sit Sit to Stand: 4: Min assist;With upper extremity assist;From bed;From toilet Stand to Sit: 4: Min assist;To bed;To toilet;With upper extremity assist Details for Transfer Assistance: Safety and techncique  cues Ambulation/Gait Ambulation/Gait Assistance: 4: Min assist Ambulation Distance (Feet): 30 Feet Assistive device: 1 person hand held assist Ambulation/Gait Assistance Details: Min steady assist due to decreased balance and "furniture walking" Gait Pattern: Step-through pattern;Decreased step length - right;Decreased step length - left;Antalgic Gait velocity: slowed Stairs: No Wheelchair Mobility Wheelchair Mobility: No    Shoulder Instructions     Exercises     PT Diagnosis: Difficulty walking;Generalized weakness  PT Problem List: Decreased activity tolerance;Decreased balance;Decreased mobility;Decreased knowledge of use of DME;Decreased safety awareness;Decreased knowledge of precautions;Obesity PT Treatment Interventions: DME instruction;Gait training;Functional mobility training;Therapeutic activities;Therapeutic exercise;Balance training;Patient/family education   PT Goals Acute Rehab PT Goals PT Goal Formulation: Patient unable to participate in goal setting Time For Goal Achievement: 10/08/12 Potential to Achieve Goals: Good Pt will go Supine/Side to Sit: with supervision PT Goal: Supine/Side to Sit - Progress: Goal set today Pt will go Sit to Supine/Side: with supervision PT Goal: Sit to Supine/Side - Progress: Goal set today Pt will go Sit to Stand: with supervision PT Goal: Sit to Stand - Progress: Goal set today Pt will go Stand to Sit: with supervision PT Goal: Stand to Sit - Progress: Goal set today Pt will Transfer Bed to Chair/Chair to Bed: with supervision PT Transfer Goal: Bed to Chair/Chair to Bed - Progress: Goal set today Pt will Ambulate: 51 - 150 feet;with supervision;with least restrictive assistive device PT Goal: Ambulate - Progress: Goal set today  Visit Information  Last PT Received On: 10/01/12 Assistance Needed: +1    Subjective Data  Subjective: "Who is that out there"  when pt. heard  a nurse talking in the hallway. Patient Stated Goal:  Pt. unable to provide but agreeable to increase walking and strength   Prior Functioning  Home Living Lives With: Spouse Available Help at Discharge: Family;Other (Comment) (pt. unable to provide specifics) Type of Home: Other (Comment) (TBD, pt unable to provide) Communication Communication: No difficulties;Other (comment) (pt. with difficulty communicating complete sentences) Dominant Hand: Right    Cognition  Overall Cognitive Status: History of cognitive impairments - at baseline Arousal/Alertness: Awake/alert Orientation Level: Disoriented to;Situation;Place;Time Behavior During Session: [facility] for tasks performed Cognition - Other Comments: Pt. heard talking in the hallway and thought it was someone coming to her home    Extremity/Trunk Assessment Right Upper Extremity Assessment RUE ROM/Strength/Tone: [facility] for tasks assessed Left Upper Extremity Assessment LUE ROM/Strength/Tone: WFL for tasks assessed Right Lower Extremity Assessment RLE ROM/Strength/Tone: Within functional levels RLE Sensation: WFL - Light Touch Left Lower Extremity Assessment LLE ROM/Strength/Tone: Within functional levels LLE Sensation: WFL - Light Touch Trunk Assessment Trunk Assessment: Normal   Balance    End of Session PT - End of Session Equipment Utilized During Treatment: Gait belt Activity Tolerance: Patient tolerated treatment well;Patient limited by fatigue Patient left: in bed;with call bell/phone within reach;with bed alarm set Nurse Communication: Mobility status  GP     Ladona Ridgel 10/01/2012, 3:40 PM Gerlean Ren PT Acute Rehab Services [place]
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
## 2012-10-02 DIAGNOSIS — N179 Acute kidney failure, unspecified: Secondary | ICD-10-CM | POA: Diagnosis not present

## 2012-10-02 DIAGNOSIS — F0390 Unspecified dementia without behavioral disturbance: Secondary | ICD-10-CM | POA: Diagnosis not present

## 2012-10-02 DIAGNOSIS — E119 Type 2 diabetes mellitus without complications: Secondary | ICD-10-CM | POA: Diagnosis not present

## 2012-10-02 DIAGNOSIS — I10 Essential (primary) hypertension: Secondary | ICD-10-CM | POA: Diagnosis not present

## 2012-10-02 DIAGNOSIS — N390 Urinary tract infection, site not specified: Secondary | ICD-10-CM | POA: Diagnosis not present

## 2012-10-02 LAB — BASIC METABOLIC PANEL
Calcium: 8.7 mg/dL (ref 8.4–10.5)
Chloride: 110 mEq/L (ref 96–112)
Creatinine, Ser: 1.8 mg/dL — ABNORMAL HIGH (ref 0.50–1.10)
GFR calc Af Amer: 29 mL/min — ABNORMAL LOW (ref >90)
Sodium: 140 mEq/L (ref 135–145)

## 2012-10-02 LAB — GLUCOSE, CAPILLARY
Glucose-Capillary: 83 mg/dL (ref 70–99)
Glucose-Capillary: 108 mg/dL — ABNORMAL HIGH (ref 70–99)
Glucose-Capillary: 81 mg/dL (ref 70–99)

## 2012-10-02 LAB — CBC
HCT: 35.7 % — ABNORMAL LOW (ref 36.0–46.0)
Platelets: 171 10*3/uL (ref 150–400)
RBC: 4.23 MIL/uL (ref 3.87–5.11)
RDW: 15.8 % — ABNORMAL HIGH (ref 11.5–15.5)
WBC: 4.7 10*3/uL (ref 4.0–10.5)

## 2012-10-02 LAB — URINE CULTURE

## 2012-10-02 MED ORDER — VANCOMYCIN HCL IN DEXTROSE 1-5 GM/200ML-% IV SOLN
1000.0000 mg | INTRAVENOUS | Status: DC
  Filled 2012-10-02 (×2): qty 200

## 2012-10-02 NOTE — Progress Notes (Signed)
Pt again pulled out her PIV and placed it on the night table. Small amount of bleed, gauze dressing applied.

## 2012-10-02 NOTE — Progress Notes (Signed)
ANTIBIOTIC CONSULT NOTE - INITIAL  Pharmacy Consult for vancomycin Indication: enterococcus UTI  Allergies  Allergen Reactions  . Latex Rash  . Other Rash    Soap & deodorant.  . Tape Rash    Patient Measurements: Weight: 215 lb 6.4 oz (97.705 kg)   Vital Signs: Temp: 98.5 F (36.9 C) (12/07 1637) Temp src: Oral (12/07 1637) BP: 155/78 mmHg (12/07 1637) Pulse Rate: 74  (12/07 1637) Intake/Output from previous day: 12/06 0701 - 12/07 0700 In: 1588.3 [P.O.:435; I.V.:1103.3; IV Piggyback:50] Out: 757 [Urine:754; Stool:3] Intake/Output from this shift:    Labs:  Basename 10/02/12 0610 10/01/12 1528 10/01/12 0455 09/30/12 1150  WBC 4.7 -- 3.8* 3.3*  HGB 11.9* -- 11.4* 12.3  PLT 171 -- 140* 159  LABCREA -- 85.68 -- --  CREATININE 1.80* -- 2.12* 2.55*   The CrCl is unknown because both a height and weight (above a minimum accepted value) are required for this calculation. No results found for this basename: VANCOTROUGH:2,VANCOPEAK:2,VANCORANDOM:2,GENTTROUGH:2,GENTPEAK:2,GENTRANDOM:2,TOBRATROUGH:2,TOBRAPEAK:2,TOBRARND:2,AMIKACINPEAK:2,AMIKACINTROU:2,AMIKACIN:2, in the last 72 hours   Microbiology: Recent Results (from the past 720 hour(s))  URINE CULTURE     Status: Normal   Collection Time   09/30/12  2:04 PM      Component Value Range Status Comment   Specimen Description URINE, CATHETERIZED   Final    Special Requests NONE   Final    Culture  Setup Time 09/30/2012 15:02   Final    Colony Count >=100,000 COLONIES/ML   Final    Culture ENTEROCOCCUS SPECIES   Final    Report Status 10/02/2012 FINAL   Final    Organism ID, Bacteria ENTEROCOCCUS SPECIES   Final     Medical History: Past Medical History  Diagnosis Date  . Hypertension   . Diabetes mellitus   . CHF (congestive heart failure)   . Renal disorder   . Gout   . Glaucoma(365)   . Hearing loss   . Dementia   . Constipation   . Chronic back pain   . Bladder prolapse   . Urinary incontinence   .  Vertigo   . Seasonal allergies   . Postnasal drip   . Abdominal muscle defects     Medications:  Prescriptions prior to admission  Medication Sig Dispense Refill  . allopurinol (ZYLOPRIM) 100 MG tablet Take 50 mg by mouth daily.       . benzocaine-resorcinol (VAGISIL) 5-2 % vaginal cream Place vaginally at bedtime.      . bisacodyl (BISACODYL) 5 MG EC tablet Take 15 mg by mouth every evening.       . brimonidine (ALPHAGAN P) 0.1 % SOLN Place 1 drop into both eyes daily.      . cetirizine (ZYRTEC) 10 MG tablet Take 10 mg by mouth daily.      . fluconazole (DIFLUCAN) 150 MG tablet Take 150 mg by mouth once.      . fluticasone (FLONASE) 50 MCG/ACT nasal spray Place 2 sprays into the nose daily as needed. As needed for allergies.      . furosemide (LASIX) 80 MG tablet Take 80 mg by mouth daily as needed. As needed for swollen ankles. Take with potassium.      Marland Kitchen latanoprost (XALATAN) 0.005 % ophthalmic solution Place 1 drop into both eyes at bedtime.      . memantine (NAMENDA) 10 MG tablet Take 10 mg by mouth 2 (two) times daily.      . rosuvastatin (CRESTOR) 10 MG tablet Take 10 mg by  mouth daily.       Marland Kitchen senna (SENOKOT) 8.6 MG tablet Take 2 tablets by mouth daily.       Marland Kitchen terconazole (TERAZOL 7) 0.4 % vaginal cream Place 1 applicator vaginally at bedtime. X 7 days. Patient has not started this medication yet. She just picked it up from the pharmacy yesterday 09/29/12.       Assessment: Tanya Smith is an 78 yo F admitted with weakness x 1 week. She has a hx of recurrent UTI and was recently on a course if antibiotics and also had vaginal candidiasis.  She was started on Rocephin 12/5 for UTI.   Her urine cx from 12/5 confirms enterococcus resistant to amp/levofloxacin and TCN. It is only sensitive to Vanc and nitrofurantion.  Nitrofurantion is contraindicated in her 2nd creat cl < 60 ml/min. She was given fluconazole 150 mg po x1 dose 12/5.   Her wt is 97.7 kg.  Her creat is down to 1.8 from  2.55 on admission.  Per MD note this is her baseline ( 1.6). Her creat cl is ~ 30 ml/min.  Goal of Therapy:  Vancomycin trough level 10-15 mcg/ml  Plan:  1. Vancomycin 1000 mg IV q24 hrs 2. F/u renal fxn 3. Steady state trough if therapy to continue for > 5 days Eudelia Bunch, Pharm.D. [id number] 10/02/2012 4:56 PM

## 2012-10-02 NOTE — Progress Notes (Signed)
Subjective: No complaints  Objective: Vital signs in last 24 hours: Temp:  [97.8 F (36.6 C)-98.2 F (36.8 C)] 98 F (36.7 C) (12/07 0558) Pulse Rate:  [68-75] 75  (12/07 0558) Resp:  [16-18] 18  (12/07 0558) BP: (110-162)/(55-79) 162/74 mmHg (12/07 0558) SpO2:  [98 %-100 %] 100 % (12/07 0558) Weight:  [97.705 kg (215 lb 6.4 oz)] 97.705 kg (215 lb 6.4 oz) (12/06 2150) Weight change: 0.544 kg (1 lb 3.2 oz) Last BM Date: 10/01/12  Intake/Output from previous day: 12/06 0701 - 12/07 0700 In: 1588.3 [P.O.:435; I.V.:1103.3; IV Piggyback:50] Out: 757 [Urine:754; Stool:3] Intake/Output this shift:    General appearance: alert Resp: clear to auscultation bilaterally Cardio: regular rate and rhythm GI: soft, non-tender; bowel sounds normal; no masses,  no organomegaly  Lab Results:  [facility] 10/02/12 0610 10/01/12 0455  WBC 4.7 3.8*  HGB 11.9* 11.4*  HCT 35.7* 34.2*  PLT 171 140*   BMET  Basename 10/02/12 0610 10/01/12 0455  NA 140 139  K 3.8 3.6  CL 110 109  CO2 16* 19  GLUCOSE 80 86  BUN 20 25*  CREATININE 1.80* 2.12*  CALCIUM 8.7 8.1*    Studies/Results: Dg Chest 2 View  09/30/2012  *RADIOLOGY REPORT*  Clinical Data: Generalized weakness, confusion  CHEST - 2 VIEW  Comparison: Chest x-ray of 06/20/2011  Findings: No active infiltrate or effusion is seen.  Cardiomegaly is stable.  There are degenerative changes throughout the thoracic spine and in both shoulders.  IMPRESSION: Stable cardiomegaly.  No active lung disease.   Original Report Authenticated By: Ivar Drape, M.D.     Medications: I have reviewed the patient's current medications.  Assessment/Plan: Principal Problem:  *Acute kidney injury -Cr at baseline stop IVF. Active Problems:  Hypertension ok  Diabetes mellitus diet controlled, on SSI  Weakness generalized PT to see  UTI (urinary tract infection) Rocephin 2, culture pending  Candidiasis of vagina diflucan X 1 and terazol  Dementia on namenda   Chronic kidney disease baseline 1.6-   Now 1.8  Disposition:  SNF rehab   LOS: 2 days   Tanya Smith 10/02/2012, 8:50 AM

## 2012-10-03 DIAGNOSIS — F0390 Unspecified dementia without behavioral disturbance: Secondary | ICD-10-CM | POA: Diagnosis not present

## 2012-10-03 DIAGNOSIS — N390 Urinary tract infection, site not specified: Secondary | ICD-10-CM | POA: Diagnosis not present

## 2012-10-03 DIAGNOSIS — I10 Essential (primary) hypertension: Secondary | ICD-10-CM | POA: Diagnosis not present

## 2012-10-03 DIAGNOSIS — E119 Type 2 diabetes mellitus without complications: Secondary | ICD-10-CM | POA: Diagnosis not present

## 2012-10-03 DIAGNOSIS — N179 Acute kidney failure, unspecified: Secondary | ICD-10-CM | POA: Diagnosis not present

## 2012-10-03 LAB — BASIC METABOLIC PANEL
Calcium: 8.8 mg/dL (ref 8.4–10.5)
Creatinine, Ser: 1.69 mg/dL — ABNORMAL HIGH (ref 0.50–1.10)
GFR calc Af Amer: 32 mL/min — ABNORMAL LOW (ref >90)
GFR calc non Af Amer: 27 mL/min — ABNORMAL LOW (ref >90)

## 2012-10-03 LAB — GLUCOSE, CAPILLARY

## 2012-10-03 MED ORDER — NITROFURANTOIN MONOHYD MACRO 100 MG PO CAPS
100.0000 mg | ORAL_CAPSULE | Freq: Two times a day (BID) | ORAL | Status: DC
  Filled 2012-10-03 (×2): qty 1

## 2012-10-03 MED ORDER — VANCOMYCIN HCL IN DEXTROSE 1-5 GM/200ML-% IV SOLN
1000.0000 mg | INTRAVENOUS | Status: DC
  Filled 2012-10-03 (×2): qty 200

## 2012-10-03 MED ORDER — LORAZEPAM 2 MG/ML IJ SOLN: 0.5000 mg | Freq: Four times a day (QID) | INTRAMUSCULAR | Status: DC | PRN

## 2012-10-03 MED ORDER — LORAZEPAM 0.5 MG PO TABS
0.5000 mg | ORAL_TABLET | Freq: Three times a day (TID) | ORAL | Status: DC | PRN
  Administered 2012-10-03 (×2): 0.5 mg via ORAL
  Filled 2012-10-03 (×2): qty 1

## 2012-10-03 MED ORDER — NITROFURANTOIN MONOHYD MACRO 100 MG PO CAPS
100.0000 mg | ORAL_CAPSULE | Freq: Two times a day (BID) | ORAL | Status: DC
  Administered 2012-10-03 (×2): 100 mg via ORAL
  Filled 2012-10-03 (×4): qty 1

## 2012-10-03 NOTE — Progress Notes (Signed)
Subjective: Pt with urine Culture Enterococci sensitive to macrobid and Vanc Poor IV access-   PICC line for Vanc vanc better No Fever  Normal WBC Cr 1.6 baseline  Objective: Vital signs in last 24 hours: Temp:  [96.7 F (35.9 C)-98.8 F (37.1 C)] 97.8 F (36.6 C) (12/08 [id number]) Pulse Rate:  [72-75] 72  (12/08 0623) Resp:  [17-18] 17  (12/07 2209) BP: (134-155)/(70-78) 148/78 mmHg (12/08 0623) SpO2:  [97 %-100 %] 100 % (12/08 0623) Weight:  [97.297 kg (214 lb 8 oz)] 97.297 kg (214 lb 8 oz) (12/07 2209) Weight change: -0.408 kg (-14.4 oz) Last BM Date: 10/01/12  Intake/Output from previous day: 12/07 0701 - 12/08 0700 In: 100 [P.O.:100] Out: 200 [Urine:200] Intake/Output this shift:    General appearance: alert Resp: clear to auscultation bilaterally Cardio: regular rate and rhythm GI: soft, non-tender; bowel sounds normal; no masses,  no organomegaly  Lab Results:  [facility] 10/02/12 0610 10/01/12 0455  WBC 4.7 3.8*  HGB 11.9* 11.4*  HCT 35.7* 34.2*  PLT 171 140*   BMET  Basename 10/03/12 0503 10/02/12 0610  NA 140 140  K 3.7 3.8  CL 109 110  CO2 19 16*  GLUCOSE 83 80  BUN 19 20  CREATININE 1.69* 1.80*  CALCIUM 8.8 8.7    Studies/Results: No results found.  Medications: I have reviewed the patient's current medications.  Assessment/Plan: Principal Problem:  *Acute kidney injury -Cr at baseline stop IVF.  Active Problems:  Hypertension ok  Diabetes mellitus diet controlled, on SSI  Weakness generalized PT to see  UTI (urinary tract infection) Entrococcus- IV vanc for now with PICC line- poor IV access  Candidiasis of vagina diflucan X 1 and terazol  Dementia on namenda  Chronic kidney disease baseline 1.69 Disposition: SNF rehab   LOS: 3 days   Uva Runkel 10/03/2012, 9:08 AM

## 2012-10-03 NOTE — Progress Notes (Signed)
Assess Pt. For PICC line.  Explained risk and benefits to daughter.  No suitable veins found.  Unable to locate Rt. Cephalic vein.  Rt Brachial vein close to and on top of artery.  Rt Basilic vein PICC would occupy more than 55% of vein.  Unable to locate Lt. Cephalic vein.  Lt. Brachial vein wraps around artery.  Lt Basilic vein PICC would occupy more than 50% of vein.  If PICC still desired recommend placement by IR

## 2012-10-04 ENCOUNTER — Inpatient Hospital Stay (HOSPITAL_COMMUNITY): Payer: Medicare Other

## 2012-10-04 DIAGNOSIS — F0390 Unspecified dementia without behavioral disturbance: Secondary | ICD-10-CM | POA: Diagnosis not present

## 2012-10-04 DIAGNOSIS — N390 Urinary tract infection, site not specified: Secondary | ICD-10-CM | POA: Diagnosis not present

## 2012-10-04 DIAGNOSIS — Z452 Encounter for adjustment and management of vascular access device: Secondary | ICD-10-CM | POA: Diagnosis not present

## 2012-10-04 DIAGNOSIS — N179 Acute kidney failure, unspecified: Secondary | ICD-10-CM | POA: Diagnosis not present

## 2012-10-04 DIAGNOSIS — I10 Essential (primary) hypertension: Secondary | ICD-10-CM | POA: Diagnosis not present

## 2012-10-04 LAB — GLUCOSE, CAPILLARY: Glucose-Capillary: 91 mg/dL (ref 70–99)

## 2012-10-04 LAB — PROTIME-INR: Prothrombin Time: 12.6 seconds (ref 11.6–15.2)

## 2012-10-04 MED ORDER — MIDAZOLAM HCL 2 MG/2ML IJ SOLN
INTRAMUSCULAR | Status: DC | PRN
  Administered 2012-10-04 (×2): 1 mg via INTRAVENOUS

## 2012-10-04 MED ORDER — HEPARIN SODIUM (PORCINE) 5000 UNIT/ML IJ SOLN
5000.0000 [IU] | Freq: Three times a day (TID) | INTRAMUSCULAR | Status: DC
  Administered 2012-10-04 – 2012-10-05 (×3): 5000 [IU] via SUBCUTANEOUS
  Filled 2012-10-04 (×6): qty 1

## 2012-10-04 MED ORDER — FENTANYL CITRATE 0.05 MG/ML IJ SOLN
INTRAMUSCULAR | Status: DC | PRN
  Administered 2012-10-04 (×2): 25 ug via INTRAVENOUS

## 2012-10-04 MED ORDER — VANCOMYCIN HCL 1000 MG IV SOLR
750.0000 mg | INTRAVENOUS | Status: DC
  Administered 2012-10-04 – 2012-10-05 (×2): 750 mg via INTRAVENOUS
  Filled 2012-10-04 (×3): qty 750

## 2012-10-04 NOTE — Progress Notes (Signed)
Subjective: No new complaints  Objective: Vital signs in last 24 hours: Temp:  [97.4 F (36.3 C)-98.2 F (36.8 C)] 97.5 F (36.4 C) (12/09 0541) Pulse Rate:  [71-87] 87  (12/09 0541) Resp:  [16-18] 18  (12/09 0541) BP: (123-139)/(71-86) 123/79 mmHg (12/09 0541) SpO2:  [94 %-97 %] 95 % (12/09 0541) Weight:  [95.346 kg (210 lb 3.2 oz)] 95.346 kg (210 lb 3.2 oz) (12/08 2004) Weight change: -1.95 kg (-4 lb 4.8 oz) Last BM Date: 10/01/12  Intake/Output from previous day: 12/08 0701 - 12/09 0700 In: 240 [P.O.:240] Out: -  Intake/Output this shift:    General appearance: alert and cooperative Resp: clear to auscultation bilaterally Cardio: regular rate and rhythm, S1, S2 normal, no murmur, click, rub or gallop  Lab Results:  [facility] 10/02/12 0610  WBC 4.7  HGB 11.9*  HCT 35.7*  PLT 171   BMET  Basename 10/03/12 0503 10/02/12 0610  NA 140 140  K 3.7 3.8  CL 109 110  CO2 19 16*  GLUCOSE 83 80  BUN 19 20  CREATININE 1.69* 1.80*  CALCIUM 8.8 8.7    Studies/Results: No results found.  Medications: I have reviewed the patient's current medications.  Assessment/Plan: Principal Problem:  *Acute kidney injury -Cr at baseline 32ff IVF.  Active Problems:  Hypertension ok  Diabetes mellitus diet controlled, on SSI  Weakness generalized PThas seen UTI (urinary tract infection) Entrococcus- stop nitrofurantoin,will arrange PICC through IR for outpt vancomycin IV for 1 week Candidiasis of vagina diflucan X 1 and terazol  Dementia on namenda  Chronic kidney disease baseline 1.69  Disposition:   LOS: 4 days   Iain Sawchuk JOSEPH 10/04/2012, 7:46 AM
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
## 2012-10-04 NOTE — Progress Notes (Signed)
Side rails up x4 per family request for pt safety.

## 2012-10-04 NOTE — Progress Notes (Signed)
Physical Therapy Treatment Patient Details Name: Tanya Smith MRN: [id number] DOB: [birth year] Today's Date: 10/04/2012 Time: [id number] PT Time Calculation (min): 29 min  PT Assessment / Plan / Recommendation Comments on Treatment Session  Progressing ambulation distance and activity tolerance; Worked with RW today as a trial to see how she does using RW -- the catch is, pt will likely forget to use it in her own environment; her daughter was present at the end of session, and reported Tanya Smith will have around the clock assist at home    Follow Up Recommendations  Home health PT;Supervision/Assistance - 24 hour;Supervision for mobility/OOB     Does the patient have the potential to tolerate intense rehabilitation     Barriers to Discharge        Equipment Recommendations  Rolling walker with 5" wheels (unless one is in the home already)    Recommendations for Other Services    Frequency Min 3X/week   Plan Discharge plan remains appropriate    Precautions / Restrictions Precautions Precautions: Fall Restrictions Weight Bearing Restrictions: No   Pertinent Vitals/Pain no apparent distress     Mobility  Bed Mobility Bed Mobility: Not assessed (presented to PT sitting EOB) Transfers Transfers: Sit to Stand;Stand to Sit Sit to Stand: 4: Min assist;With upper extremity assist;From bed;From toilet Stand to Sit: 4: Min assist;To bed;To toilet;With upper extremity assist Details for Transfer Assistance: Safety and techncique cues Ambulation/Gait Ambulation/Gait Assistance: 4: Min assist;4: Min guard Ambulation Distance (Feet): 110 Feet Assistive device: Rolling walker Ambulation/Gait Assistance Details: cues for posture, RW proximity, and to keep RW close, especially when navigating in bathroom; Occasional min assist for RW management; manual facilitation of weight shift fully into stance R and L to be able to increase step length, and smoothness of gait Gait Pattern: Step-through  pattern Gait velocity: slowed    Exercises     PT Diagnosis:    PT Problem List:   PT Treatment Interventions:     PT Goals Acute Rehab PT Goals Time For Goal Achievement: 10/08/12 Potential to Achieve Goals: Good Pt will go Supine/Side to Sit: with supervision Pt will go Sit to Stand: with supervision PT Goal: Sit to Stand - Progress: Progressing toward goal Pt will go Stand to Sit: with supervision PT Goal: Stand to Sit - Progress: Progressing toward goal Pt will Ambulate: 51 - 150 feet;with supervision;with least restrictive assistive device PT Goal: Ambulate - Progress: Progressing toward goal  Visit Information  Last PT Received On: 10/04/12 Assistance Needed: +1    Subjective Data  Subjective: Agreeable to amb; Able to identify a need to use the bathroom when asked if she needed to go   Cognition  Overall Cognitive Status: History of cognitive impairments - at baseline Arousal/Alertness: Awake/alert Orientation Level: Disoriented to;Situation;Place;Time Behavior During Session: [facility] for tasks performed    Balance     End of Session PT - End of Session Equipment Utilized During Treatment: Gait belt Activity Tolerance: Patient tolerated treatment well Patient left: in chair;with call bell/phone within reach;with chair alarm set;with family/visitor present Nurse Communication: Mobility status   GP     Quin Hoop [place], [place]  10/04/2012, 10:06 AM

## 2012-10-04 NOTE — Procedures (Signed)
R IJ PICC No complication No blood loss. See complete dictation in [facility].

## 2012-10-04 NOTE — ED Notes (Addendum)
Time Out at 1300 not as previously documented

## 2012-10-04 NOTE — Progress Notes (Signed)
Spoke with daughter regarding home health services: PT and RN for IV antibiotics She selected [place].  Arville Go called with referral for [facility] PT and RN for IV antibiotics.

## 2012-10-04 NOTE — Progress Notes (Signed)
ANTIBIOTIC CONSULT NOTE - FOLLOW UP  Pharmacy Consult for Vancomycin Indication: Enterococcus UTI  Allergies  Allergen Reactions  . Latex Rash  . Other Rash    Soap & deodorant.  . Tape Rash    Patient Measurements: Height: 5' 4.96" (165 cm) Weight: 210 lb 3.2 oz (95.346 kg) IBW/kg (Calculated) : 56.91   Vital Signs: Temp: 98.1 F (36.7 C) (12/09 0756) Temp src: Oral (12/09 0756) BP: 130/83 mmHg (12/09 0756) Pulse Rate: 77  (12/09 0756) Intake/Output from previous day: 12/08 0701 - 12/09 0700 In: 240 [P.O.:240] Out: -  Intake/Output from this shift:    Labs:  Basename 10/03/12 0503 10/02/12 0610 10/01/12 1528  WBC -- 4.7 --  HGB -- 11.9* --  PLT -- 171 --  LABCREA -- -- 85.68  CREATININE 1.69* 1.80* --   Estimated Creatinine Clearance: 30.3 ml/min (by C-G formula based on Cr of 1.69). No results found for this basename: VANCOTROUGH:2,VANCOPEAK:2,VANCORANDOM:2,GENTTROUGH:2,GENTPEAK:2,GENTRANDOM:2,TOBRATROUGH:2,TOBRAPEAK:2,TOBRARND:2,AMIKACINPEAK:2,AMIKACINTROU:2,AMIKACIN:2, in the last 72 hours   Microbiology: Recent Results (from the past 720 hour(s))  URINE CULTURE     Status: Normal   Collection Time   09/30/12  2:04 PM      Component Value Range Status Comment   Specimen Description URINE, CATHETERIZED   Final    Special Requests NONE   Final    Culture  Setup Time 09/30/2012 15:02   Final    Colony Count >=100,000 COLONIES/ML   Final    Culture ENTEROCOCCUS SPECIES   Final    Report Status 10/02/2012 FINAL   Final    Organism ID, Bacteria ENTEROCOCCUS SPECIES   Final     Anti-infectives     Start     Dose/Rate Route Frequency Ordered Stop   10/03/12 0915   vancomycin (VANCOCIN) IVPB 1000 mg/200 mL premix        1,000 mg 200 mL/hr over 60 Minutes Intravenous Every 24 hours 10/03/12 0914     10/02/12 1800   vancomycin (VANCOCIN) IVPB 1000 mg/200 mL premix  Status:  Discontinued        1,000 mg 200 mL/hr over 60 Minutes Intravenous Every 24 hours  10/02/12 1658 10/03/12 0907   10/01/12 1500   cefTRIAXone (ROCEPHIN) 1 g in dextrose 5 % 50 mL IVPB  Status:  Discontinued        1 g 100 mL/hr over 30 Minutes Intravenous Every 24 hours 09/30/12 2051 10/02/12 1657   09/30/12 2130   fluconazole (DIFLUCAN) tablet 150 mg  Status:  Discontinued        150 mg Oral Daily 09/30/12 2051 10/01/12 0736   09/30/12 1530   cefTRIAXone (ROCEPHIN) 1 g in dextrose 5 % 50 mL IVPB        1 g 100 mL/hr over 30 Minutes Intravenous  Once 09/30/12 1517 09/30/12 1627          Assessment: 78 y.o. F admitted on 12/5 with weakness x 1 week. Given the patient's history of recurrent UTIs she was started empirically on Rocephin. On 12/7 cultures and sensitivities were reported as Enterococcus (R to amp/levo/tetra, and S to Vanc/macrobid). The patient is unable to receive macrobid as it is contraindicated with CrCl<60 ml/min. Vancomycin was ordered to start on 12/7 however the patient has yet to receive any doses due to line issues. IR is to place a PICC line today and per MD note, patient is to receive ~1 week of IV Vancomycin as an outpatient.  Given the patient's age, estimated CrCl~30 ml/min, and plans  to receive treatment as an outpatient -- will reduce the Vancomycin dose to 750 mg IV every 24 hours to help reduce risk of accumulation. This dose should still achieve levels within the range of 10-15 mcg/ml that are adequate to treat at UTI.  Goal of Therapy:  Vancomycin trough level 10-15 mcg/ml  Plan:  1. Vancomycin 750 mg IV every 24 hours 2. Will follow-up placement of PICC line by IR today to adjust Vancomycin administration time 3. Will continue to follow renal function, culture results, LOT, and antibiotic de-escalation plans   Alycia Rossetti, PharmD, BCPS Clinical Pharmacist Pager: [phone] 10/04/2012 11:05 AM

## 2012-10-05 DIAGNOSIS — N390 Urinary tract infection, site not specified: Secondary | ICD-10-CM | POA: Diagnosis not present

## 2012-10-05 DIAGNOSIS — F0390 Unspecified dementia without behavioral disturbance: Secondary | ICD-10-CM | POA: Diagnosis not present

## 2012-10-05 DIAGNOSIS — I10 Essential (primary) hypertension: Secondary | ICD-10-CM | POA: Diagnosis not present

## 2012-10-05 DIAGNOSIS — N179 Acute kidney failure, unspecified: Secondary | ICD-10-CM | POA: Diagnosis not present

## 2012-10-05 LAB — GLUCOSE, CAPILLARY
Glucose-Capillary: 81 mg/dL (ref 70–99)
Glucose-Capillary: 110 mg/dL — ABNORMAL HIGH (ref 70–99)

## 2012-10-05 MED ORDER — HEPARIN (PORCINE) LOCK FLUSH 10 UNIT/ML IV SOLN: 10.0000 [IU] | INTRAVENOUS | Status: DC | PRN

## 2012-10-05 MED ORDER — ALLOPURINOL 150 MG HALF TABLET: 150.0000 mg | ORAL_TABLET | Freq: Every day | ORAL | Status: DC

## 2012-10-05 MED ORDER — SODIUM CHLORIDE 0.9 % IJ SOLN
10.0000 mL | INTRAMUSCULAR | Status: DC | PRN
  Administered 2012-10-05: 10 mL

## 2012-10-05 MED ORDER — VANCOMYCIN HCL 1000 MG IV SOLR: 750.0000 mg | INTRAVENOUS | Status: AC

## 2012-10-05 MED ORDER — HEPARIN SOD (PORK) LOCK FLUSH 100 UNIT/ML IV SOLN
250.0000 [IU] | INTRAVENOUS | Status: AC | PRN
  Administered 2012-10-05: 200 [IU]

## 2012-10-05 NOTE — Discharge Summary (Signed)
Physician Discharge Summary  Patient ID: Tanya Smith MRN: [id number] DOB/AGE: [birth year] 76 y.o.  Admit date: 09/30/2012 Discharge date: 10/05/2012  Admission Diagnoses: Acute renal failure UTI Generalized weakness Diabetes mellitus type 2 Hypertension Dementia Vaginal candidiasis Chronic kidney disease stage III  Discharge Diagnoses:  Principal Problem:  *Acute kidney injury Active Problems:  Hypertension  Diabetes mellitus  Weakness generalized  UTI (urinary tract infection)  Candidiasis of vagina  Dementia  Chronic kidney disease   Discharged Condition: good  Hospital Course: The patient was admitted on December 5 brought in by her granddaughter with one week of increasing generalized weakness. She had a recent UTI a course of Cipro and also treated for vaginal candidiasis. She been very weak with poor appetite and spending 2 days prior to admission bedridden. In the ED she was noted to have acute renal failure with creatinine 2.55, baseline about 1.6. She also was noted to have UTI. She was admitted and placed on IV fluids and Rocephin. Urine culture showed enterococcus greater than tended 5 species resistant to ampicillin, levofloxacin and tetracycline and sensitive to vancomycin and nitrofurantoin. The patient's renal function make her a poor candidate for nitrofurantoin so a PICC line was placed by interventional radiology and she was started on vancomycin IV plan total of 10 days. Distal be completed at home as an outpatient. With IV fluids her renal function improved and at discharge creatinine was 1.7. The patient was feeling better eating well and ambulating with assistance, seen by physical therapy and home physical therapy was recommended.  Consults: None  Significant Diagnostic Studies: labs: As above and radiology: CXR: normal  Treatments: IV hydration, antibiotics: vancomycin and ceftriaxone and therapies: PT  Discharge Exam: Blood pressure 153/84, pulse 73,  temperature 97.6 F (36.4 C), temperature source Oral, resp. rate 18, height 5' 4.96" (1.65 m), weight 94.121 kg (207 lb 8 oz), SpO2 97.00%. General appearance: alert Resp: clear to auscultation bilaterally Cardio: regular rate and rhythm, S1, S2 normal, no murmur, click, rub or gallop  Disposition: 01-Home or Self Care     Medication List     As of 10/05/2012  7:55 AM    STOP taking these medications         fluconazole 150 MG tablet   Commonly known as: DIFLUCAN      TAKE these medications         allopurinol 150 mg Tabs   Commonly known as: ZYLOPRIM   Take 0.5 tablets (150 mg total) by mouth daily.      benzocaine-resorcinol 5-2 % vaginal cream   Commonly known as: VAGISIL   Place vaginally at bedtime.      bisacodyl 5 MG EC tablet   Generic drug: bisacodyl   Take 15 mg by mouth every evening.      brimonidine 0.1 % Soln   Commonly known as: ALPHAGAN P   Place 1 drop into both eyes daily.      cetirizine 10 MG tablet   Commonly known as: ZYRTEC   Take 10 mg by mouth daily.      fluticasone 50 MCG/ACT nasal spray   Commonly known as: FLONASE   Place 2 sprays into the nose daily as needed. As needed for allergies.      furosemide 80 MG tablet   Commonly known as: LASIX   Take 80 mg by mouth daily as needed. As needed for swollen ankles. Take with potassium.      latanoprost 0.005 % ophthalmic solution  Commonly known as: XALATAN   Place 1 drop into both eyes at bedtime.      memantine 10 MG tablet   Commonly known as: NAMENDA   Take 10 mg by mouth 2 (two) times daily.      rosuvastatin 10 MG tablet   Commonly known as: CRESTOR   Take 10 mg by mouth daily.      senna 8.6 MG tablet   Commonly known as: SENOKOT   Take 2 tablets by mouth daily.      sodium chloride 0.9 % SOLN 150 mL with vancomycin 1000 MG SOLR 750 mg   Inject 750 mg into the vein daily.      terconazole 0.4 % vaginal cream   Commonly known as: TERAZOL 7   Place 1 applicator  vaginally at bedtime. X 7 days. Patient has not started this medication yet. She just picked it up from the pharmacy yesterday 09/29/12.           Follow-up Information    In 10 days to follow up.         SignedIrven Shelling 10/05/2012, 7:55 AM

## 2012-10-05 NOTE — Progress Notes (Signed)
[facility] to provide home IV antibiotic with RN visits and HHPT.

## 2012-10-05 NOTE — Progress Notes (Signed)
Physical Therapy Treatment Patient Details Name: Tanya Smith MRN: [id number] DOB: [birth year] Today's Date: 10/05/2012 Time: 1212-1230 PT Time Calculation (min): 18 min  PT Assessment / Plan / Recommendation Comments on Treatment Session  Considering today's performance with amb compared to yesterday's, recommend use of RW for steadiness with amb; Noted for dc home today, agree with HHOT follow up    Follow Up Recommendations  Home health PT;Supervision/Assistance - 24 hour;Supervision for mobility/OOB     Does the patient have the potential to tolerate intense rehabilitation     Barriers to Discharge        Equipment Recommendations  Rolling walker with 5" wheels    Recommendations for Other Services    Frequency Min 3X/week   Plan Discharge plan remains appropriate    Precautions / Restrictions Precautions Precautions: Fall   Pertinent Vitals/Pain no apparent distress     Mobility  Bed Mobility Bed Mobility: Not assessed Transfers Transfers: Sit to Stand;Stand to Sit Sit to Stand: 4: Min assist;With upper extremity assist;From toilet Stand to Sit: 4: Min assist;With upper extremity assist;To chair/3-in-1;With armrests Details for Transfer Assistance: Safety and techncique cues Ambulation/Gait Ambulation/Gait Assistance: 4: Min assist Ambulation Distance (Feet): 100 Feet Assistive device: 1 person hand held assist;Other (Comment) (and use fo hallway rail) Ambulation/Gait Assistance Details: Proceeded with amb without assistive device, with unilateral handheld assist as per pt's daughter, she typically uses a cane; Less steady today (with unilateral UE support) than yesterday (with bilateral UE support), and noted pt tending to reach out for hallway rail with free hand as well Gait Pattern: Decreased stride length;Decreased step length - right;Decreased step length - left Gait velocity: slowed Stairs:  (Pt declining)    Exercises     PT Diagnosis:    PT Problem List:    PT Treatment Interventions:     PT Goals Acute Rehab PT Goals Time For Goal Achievement: 10/08/12 Potential to Achieve Goals: Good Pt will go Sit to Stand: with supervision PT Goal: Sit to Stand - Progress: Progressing toward goal Pt will go Stand to Sit: with supervision PT Goal: Stand to Sit - Progress: Progressing toward goal Pt will Ambulate: 51 - 150 feet;with supervision;with least restrictive assistive device PT Goal: Ambulate - Progress: Progressing toward goal  Visit Information  Last PT Received On: 10/05/12 Assistance Needed: +1    Subjective Data  Subjective: Needed encouragement to amb   Cognition  Overall Cognitive Status: History of cognitive impairments - at baseline Arousal/Alertness: Awake/alert Orientation Level: Disoriented to;Situation;Place;Time Behavior During Session: [facility] for tasks performed    Balance     End of Session PT - End of Session Equipment Utilized During Treatment: Gait belt Activity Tolerance: Patient tolerated treatment well Patient left: in chair;with call bell/phone within reach;with chair alarm set Nurse Communication: Mobility status   GP     Quin Hoop [place], [place]  10/05/2012, 1:32 PM

## 2012-10-06 DIAGNOSIS — I509 Heart failure, unspecified: Secondary | ICD-10-CM | POA: Diagnosis not present

## 2012-10-06 DIAGNOSIS — N390 Urinary tract infection, site not specified: Secondary | ICD-10-CM | POA: Diagnosis not present

## 2012-10-06 DIAGNOSIS — Z452 Encounter for adjustment and management of vascular access device: Secondary | ICD-10-CM | POA: Diagnosis not present

## 2012-10-06 DIAGNOSIS — I129 Hypertensive chronic kidney disease with stage 1 through stage 4 chronic kidney disease, or unspecified chronic kidney disease: Secondary | ICD-10-CM | POA: Diagnosis not present

## 2012-10-06 DIAGNOSIS — F0390 Unspecified dementia without behavioral disturbance: Secondary | ICD-10-CM | POA: Diagnosis not present

## 2012-10-07 DIAGNOSIS — I509 Heart failure, unspecified: Secondary | ICD-10-CM | POA: Diagnosis not present

## 2012-10-07 DIAGNOSIS — F0390 Unspecified dementia without behavioral disturbance: Secondary | ICD-10-CM | POA: Diagnosis not present

## 2012-10-07 DIAGNOSIS — Z452 Encounter for adjustment and management of vascular access device: Secondary | ICD-10-CM | POA: Diagnosis not present

## 2012-10-07 DIAGNOSIS — I129 Hypertensive chronic kidney disease with stage 1 through stage 4 chronic kidney disease, or unspecified chronic kidney disease: Secondary | ICD-10-CM | POA: Diagnosis not present

## 2012-10-07 DIAGNOSIS — N390 Urinary tract infection, site not specified: Secondary | ICD-10-CM | POA: Diagnosis not present

## 2012-10-11 DIAGNOSIS — N390 Urinary tract infection, site not specified: Secondary | ICD-10-CM | POA: Diagnosis not present

## 2012-10-11 DIAGNOSIS — I129 Hypertensive chronic kidney disease with stage 1 through stage 4 chronic kidney disease, or unspecified chronic kidney disease: Secondary | ICD-10-CM | POA: Diagnosis not present

## 2012-10-11 DIAGNOSIS — I509 Heart failure, unspecified: Secondary | ICD-10-CM | POA: Diagnosis not present

## 2012-10-11 DIAGNOSIS — Z452 Encounter for adjustment and management of vascular access device: Secondary | ICD-10-CM | POA: Diagnosis not present

## 2012-10-11 DIAGNOSIS — F0390 Unspecified dementia without behavioral disturbance: Secondary | ICD-10-CM | POA: Diagnosis not present

## 2012-10-13 DIAGNOSIS — I129 Hypertensive chronic kidney disease with stage 1 through stage 4 chronic kidney disease, or unspecified chronic kidney disease: Secondary | ICD-10-CM | POA: Diagnosis not present

## 2012-10-13 DIAGNOSIS — I509 Heart failure, unspecified: Secondary | ICD-10-CM | POA: Diagnosis not present

## 2012-10-13 DIAGNOSIS — N390 Urinary tract infection, site not specified: Secondary | ICD-10-CM | POA: Diagnosis not present

## 2012-10-13 DIAGNOSIS — F0390 Unspecified dementia without behavioral disturbance: Secondary | ICD-10-CM | POA: Diagnosis not present

## 2012-10-13 DIAGNOSIS — Z452 Encounter for adjustment and management of vascular access device: Secondary | ICD-10-CM | POA: Diagnosis not present

## 2012-10-18 DIAGNOSIS — I129 Hypertensive chronic kidney disease with stage 1 through stage 4 chronic kidney disease, or unspecified chronic kidney disease: Secondary | ICD-10-CM | POA: Diagnosis not present

## 2012-10-18 DIAGNOSIS — F0390 Unspecified dementia without behavioral disturbance: Secondary | ICD-10-CM | POA: Diagnosis not present

## 2012-10-18 DIAGNOSIS — Z452 Encounter for adjustment and management of vascular access device: Secondary | ICD-10-CM | POA: Diagnosis not present

## 2012-10-18 DIAGNOSIS — I509 Heart failure, unspecified: Secondary | ICD-10-CM | POA: Diagnosis not present

## 2012-10-18 DIAGNOSIS — N390 Urinary tract infection, site not specified: Secondary | ICD-10-CM | POA: Diagnosis not present

## 2012-10-19 DIAGNOSIS — I509 Heart failure, unspecified: Secondary | ICD-10-CM | POA: Diagnosis not present

## 2012-10-19 DIAGNOSIS — Z452 Encounter for adjustment and management of vascular access device: Secondary | ICD-10-CM | POA: Diagnosis not present

## 2012-10-19 DIAGNOSIS — I129 Hypertensive chronic kidney disease with stage 1 through stage 4 chronic kidney disease, or unspecified chronic kidney disease: Secondary | ICD-10-CM | POA: Diagnosis not present

## 2012-10-19 DIAGNOSIS — F0390 Unspecified dementia without behavioral disturbance: Secondary | ICD-10-CM | POA: Diagnosis not present

## 2012-10-19 DIAGNOSIS — N390 Urinary tract infection, site not specified: Secondary | ICD-10-CM | POA: Diagnosis not present

## 2012-10-22 DIAGNOSIS — Z452 Encounter for adjustment and management of vascular access device: Secondary | ICD-10-CM | POA: Diagnosis not present

## 2012-10-22 DIAGNOSIS — I129 Hypertensive chronic kidney disease with stage 1 through stage 4 chronic kidney disease, or unspecified chronic kidney disease: Secondary | ICD-10-CM | POA: Diagnosis not present

## 2012-10-22 DIAGNOSIS — N390 Urinary tract infection, site not specified: Secondary | ICD-10-CM | POA: Diagnosis not present

## 2012-10-22 DIAGNOSIS — F0390 Unspecified dementia without behavioral disturbance: Secondary | ICD-10-CM | POA: Diagnosis not present

## 2012-10-22 DIAGNOSIS — I509 Heart failure, unspecified: Secondary | ICD-10-CM | POA: Diagnosis not present

## 2012-10-25 ENCOUNTER — Ambulatory Visit (HOSPITAL_COMMUNITY)
Admission: RE | Admit: 2012-10-25 | Discharge: 2012-10-25 | Disposition: A | Discharge status: Home / Self Care | Payer: Medicare Other | Source: Ambulatory Visit | Attending: Internal Medicine | Admitting: Internal Medicine

## 2012-10-25 ENCOUNTER — Other Ambulatory Visit (HOSPITAL_COMMUNITY): Payer: Self-pay | Admitting: Internal Medicine

## 2012-10-25 DIAGNOSIS — Z4901 Encounter for fitting and adjustment of extracorporeal dialysis catheter: Secondary | ICD-10-CM | POA: Diagnosis not present

## 2012-10-25 DIAGNOSIS — B999 Unspecified infectious disease: Secondary | ICD-10-CM

## 2012-10-25 DIAGNOSIS — N390 Urinary tract infection, site not specified: Secondary | ICD-10-CM | POA: Diagnosis not present

## 2012-10-25 DIAGNOSIS — Z452 Encounter for adjustment and management of vascular access device: Secondary | ICD-10-CM | POA: Diagnosis not present

## 2012-10-25 MED ORDER — CHLORHEXIDINE GLUCONATE 4 % EX LIQD
CUTANEOUS | Status: AC
  Filled 2012-10-25: qty 15

## 2012-10-25 NOTE — Procedures (Signed)
Right IJ tunneled central venous catheter removed in its entirety without immediate complications. Vaseline gauze dressing applied to site. No immediate complications.

## 2012-11-10 DIAGNOSIS — N390 Urinary tract infection, site not specified: Secondary | ICD-10-CM | POA: Diagnosis not present

## 2012-11-10 DIAGNOSIS — Z452 Encounter for adjustment and management of vascular access device: Secondary | ICD-10-CM | POA: Diagnosis not present

## 2012-11-10 DIAGNOSIS — I129 Hypertensive chronic kidney disease with stage 1 through stage 4 chronic kidney disease, or unspecified chronic kidney disease: Secondary | ICD-10-CM | POA: Diagnosis not present

## 2012-11-10 DIAGNOSIS — F0390 Unspecified dementia without behavioral disturbance: Secondary | ICD-10-CM | POA: Diagnosis not present

## 2012-11-10 DIAGNOSIS — I509 Heart failure, unspecified: Secondary | ICD-10-CM | POA: Diagnosis not present

## 2012-11-19 DIAGNOSIS — B351 Tinea unguium: Secondary | ICD-10-CM | POA: Diagnosis not present

## 2012-11-19 DIAGNOSIS — L84 Corns and callosities: Secondary | ICD-10-CM | POA: Diagnosis not present

## 2012-11-19 DIAGNOSIS — M79609 Pain in unspecified limb: Secondary | ICD-10-CM | POA: Diagnosis not present

## 2012-11-23 DIAGNOSIS — N2581 Secondary hyperparathyroidism of renal origin: Secondary | ICD-10-CM | POA: Diagnosis not present

## 2012-11-23 DIAGNOSIS — D509 Iron deficiency anemia, unspecified: Secondary | ICD-10-CM | POA: Diagnosis not present

## 2012-11-23 DIAGNOSIS — N390 Urinary tract infection, site not specified: Secondary | ICD-10-CM | POA: Diagnosis not present

## 2012-11-23 DIAGNOSIS — E1129 Type 2 diabetes mellitus with other diabetic kidney complication: Secondary | ICD-10-CM | POA: Diagnosis not present

## 2012-11-23 DIAGNOSIS — I10 Essential (primary) hypertension: Secondary | ICD-10-CM | POA: Diagnosis not present

## 2012-11-23 DIAGNOSIS — N179 Acute kidney failure, unspecified: Secondary | ICD-10-CM | POA: Diagnosis not present

## 2012-11-26 ENCOUNTER — Other Ambulatory Visit: Payer: Self-pay | Admitting: Nephrology

## 2012-11-26 DIAGNOSIS — N179 Acute kidney failure, unspecified: Secondary | ICD-10-CM

## 2012-11-29 DIAGNOSIS — M5430 Sciatica, unspecified side: Secondary | ICD-10-CM | POA: Diagnosis not present

## 2012-11-29 DIAGNOSIS — F329 Major depressive disorder, single episode, unspecified: Secondary | ICD-10-CM | POA: Diagnosis not present

## 2012-11-29 DIAGNOSIS — E119 Type 2 diabetes mellitus without complications: Secondary | ICD-10-CM | POA: Diagnosis not present

## 2012-11-29 DIAGNOSIS — F0390 Unspecified dementia without behavioral disturbance: Secondary | ICD-10-CM | POA: Diagnosis not present

## 2012-11-29 DIAGNOSIS — E7800 Pure hypercholesterolemia, unspecified: Secondary | ICD-10-CM | POA: Diagnosis not present

## 2012-11-30 ENCOUNTER — Other Ambulatory Visit: Payer: Medicare Other

## 2012-12-02 ENCOUNTER — Other Ambulatory Visit: Payer: Self-pay | Admitting: Internal Medicine

## 2012-12-02 DIAGNOSIS — F0390 Unspecified dementia without behavioral disturbance: Secondary | ICD-10-CM

## 2012-12-07 ENCOUNTER — Ambulatory Visit
Admission: RE | Admit: 2012-12-07 | Discharge: 2012-12-07 | Disposition: A | Discharge status: Home / Self Care | Payer: Medicare Other | Source: Ambulatory Visit | Attending: Internal Medicine | Admitting: Internal Medicine

## 2012-12-07 DIAGNOSIS — F0390 Unspecified dementia without behavioral disturbance: Secondary | ICD-10-CM | POA: Diagnosis not present

## 2012-12-08 DIAGNOSIS — D509 Iron deficiency anemia, unspecified: Secondary | ICD-10-CM | POA: Diagnosis not present

## 2012-12-08 DIAGNOSIS — I10 Essential (primary) hypertension: Secondary | ICD-10-CM | POA: Diagnosis not present

## 2012-12-08 DIAGNOSIS — E1129 Type 2 diabetes mellitus with other diabetic kidney complication: Secondary | ICD-10-CM | POA: Diagnosis not present

## 2012-12-08 DIAGNOSIS — N2581 Secondary hyperparathyroidism of renal origin: Secondary | ICD-10-CM | POA: Diagnosis not present

## 2012-12-08 DIAGNOSIS — N179 Acute kidney failure, unspecified: Secondary | ICD-10-CM | POA: Diagnosis not present

## 2012-12-16 ENCOUNTER — Ambulatory Visit
Admission: RE | Admit: 2012-12-16 | Discharge: 2012-12-16 | Disposition: A | Discharge status: Home / Self Care | Payer: Medicare Other | Source: Ambulatory Visit | Attending: Nephrology | Admitting: Nephrology

## 2012-12-16 DIAGNOSIS — S37019A Minor contusion of unspecified kidney, initial encounter: Secondary | ICD-10-CM | POA: Diagnosis not present

## 2012-12-16 DIAGNOSIS — N179 Acute kidney failure, unspecified: Secondary | ICD-10-CM

## 2012-12-23 ENCOUNTER — Emergency Department (HOSPITAL_COMMUNITY): Payer: Medicare Other

## 2012-12-23 ENCOUNTER — Encounter (HOSPITAL_COMMUNITY): Payer: Self-pay | Admitting: Emergency Medicine

## 2012-12-23 ENCOUNTER — Observation Stay (HOSPITAL_COMMUNITY)
Admission: EM | Admit: 2012-12-23 | Discharge: 2012-12-25 | Disposition: A | Discharge status: Home / Self Care | Payer: Medicare Other | Attending: Internal Medicine | Admitting: Internal Medicine

## 2012-12-23 DIAGNOSIS — N183 Chronic kidney disease, stage 3 unspecified: Secondary | ICD-10-CM | POA: Insufficient documentation

## 2012-12-23 DIAGNOSIS — F0390 Unspecified dementia without behavioral disturbance: Secondary | ICD-10-CM | POA: Insufficient documentation

## 2012-12-23 DIAGNOSIS — E876 Hypokalemia: Principal | ICD-10-CM

## 2012-12-23 DIAGNOSIS — H409 Unspecified glaucoma: Secondary | ICD-10-CM | POA: Diagnosis not present

## 2012-12-23 DIAGNOSIS — I129 Hypertensive chronic kidney disease with stage 1 through stage 4 chronic kidney disease, or unspecified chronic kidney disease: Secondary | ICD-10-CM | POA: Insufficient documentation

## 2012-12-23 DIAGNOSIS — E119 Type 2 diabetes mellitus without complications: Secondary | ICD-10-CM | POA: Diagnosis present

## 2012-12-23 DIAGNOSIS — Z79899 Other long term (current) drug therapy: Secondary | ICD-10-CM | POA: Insufficient documentation

## 2012-12-23 DIAGNOSIS — H9190 Unspecified hearing loss, unspecified ear: Secondary | ICD-10-CM | POA: Diagnosis not present

## 2012-12-23 DIAGNOSIS — M109 Gout, unspecified: Secondary | ICD-10-CM | POA: Diagnosis not present

## 2012-12-23 DIAGNOSIS — I5189 Other ill-defined heart diseases: Secondary | ICD-10-CM

## 2012-12-23 DIAGNOSIS — R079 Chest pain, unspecified: Secondary | ICD-10-CM

## 2012-12-23 DIAGNOSIS — I509 Heart failure, unspecified: Secondary | ICD-10-CM | POA: Insufficient documentation

## 2012-12-23 DIAGNOSIS — Z23 Encounter for immunization: Secondary | ICD-10-CM | POA: Insufficient documentation

## 2012-12-23 DIAGNOSIS — N189 Chronic kidney disease, unspecified: Secondary | ICD-10-CM | POA: Diagnosis not present

## 2012-12-23 DIAGNOSIS — R072 Precordial pain: Secondary | ICD-10-CM | POA: Diagnosis not present

## 2012-12-23 DIAGNOSIS — I10 Essential (primary) hypertension: Secondary | ICD-10-CM

## 2012-12-23 DIAGNOSIS — I519 Heart disease, unspecified: Secondary | ICD-10-CM | POA: Diagnosis not present

## 2012-12-23 LAB — CBC
HCT: 37.9 % (ref 36.0–46.0)
Hemoglobin: 12.9 g/dL (ref 12.0–15.0)
MCH: 29.1 pg (ref 26.0–34.0)
MCHC: 34 g/dL (ref 30.0–36.0)
MCV: 85.6 fL (ref 78.0–100.0)
RBC: 4.43 MIL/uL (ref 3.87–5.11)

## 2012-12-23 LAB — CBC WITH DIFFERENTIAL/PLATELET
Lymphocytes Relative: 40 % (ref 12–46)
Lymphs Abs: 2.5 10*3/uL (ref 0.7–4.0)
MCV: 84.7 fL (ref 78.0–100.0)
Neutro Abs: 3 10*3/uL (ref 1.7–7.7)
Neutrophils Relative %: 49 % (ref 43–77)
Platelets: 192 10*3/uL (ref 150–400)
RBC: 4.12 MIL/uL (ref 3.87–5.11)
WBC: 6.1 10*3/uL (ref 4.0–10.5)

## 2012-12-23 LAB — COMPREHENSIVE METABOLIC PANEL
ALT: 17 U/L (ref 0–35)
Alkaline Phosphatase: 95 U/L (ref 39–117)
CO2: 22 mEq/L (ref 19–32)
GFR calc Af Amer: 40 mL/min — ABNORMAL LOW (ref >90)
GFR calc non Af Amer: 34 mL/min — ABNORMAL LOW (ref >90)
Glucose, Bld: 95 mg/dL (ref 70–99)
Potassium: 2.6 mEq/L — CL (ref 3.5–5.1)
Sodium: 144 mEq/L (ref 135–145)

## 2012-12-23 MED ORDER — SODIUM CHLORIDE 0.9 % IJ SOLN
3.0000 mL | Freq: Two times a day (BID) | INTRAMUSCULAR | Status: DC
  Administered 2012-12-24 (×2): 3 mL via INTRAVENOUS

## 2012-12-23 MED ORDER — TRAVOPROST (BAK FREE) 0.004 % OP SOLN
1.0000 [drp] | Freq: Every day | OPHTHALMIC | Status: DC
  Administered 2012-12-23: 1 [drp] via OPHTHALMIC
  Filled 2012-12-23 (×2): qty 2.5

## 2012-12-23 MED ORDER — ONDANSETRON HCL 4 MG PO TABS: 4.0000 mg | ORAL_TABLET | Freq: Four times a day (QID) | ORAL | Status: DC | PRN

## 2012-12-23 MED ORDER — SODIUM CHLORIDE 0.9 % IJ SOLN: 3.0000 mL | INTRAMUSCULAR | Status: DC | PRN

## 2012-12-23 MED ORDER — SODIUM CHLORIDE 0.9 % IV SOLN: 250.0000 mL | INTRAVENOUS | Status: DC | PRN

## 2012-12-23 MED ORDER — FLUTICASONE PROPIONATE 50 MCG/ACT NA SUSP
2.0000 | Freq: Every day | NASAL | Status: DC | PRN
  Filled 2012-12-23: qty 16

## 2012-12-23 MED ORDER — BISACODYL 5 MG PO TBEC
15.0000 mg | DELAYED_RELEASE_TABLET | Freq: Every evening | ORAL | Status: DC
  Administered 2012-12-24: 15 mg via ORAL
  Filled 2012-12-23: qty 3

## 2012-12-23 MED ORDER — SODIUM CHLORIDE 0.9 % IJ SOLN
3.0000 mL | Freq: Two times a day (BID) | INTRAMUSCULAR | Status: DC
  Administered 2012-12-24: 3 mL via INTRAVENOUS

## 2012-12-23 MED ORDER — LORATADINE 10 MG PO TABS
10.0000 mg | ORAL_TABLET | Freq: Every day | ORAL | Status: DC
  Administered 2012-12-24 – 2012-12-25 (×2): 10 mg via ORAL
  Filled 2012-12-23 (×2): qty 1

## 2012-12-23 MED ORDER — POTASSIUM CHLORIDE 10 MEQ/100ML IV SOLN
10.0000 meq | INTRAVENOUS | Status: AC
  Administered 2012-12-23 – 2012-12-24 (×3): 10 meq via INTRAVENOUS
  Filled 2012-12-23 (×3): qty 100

## 2012-12-23 MED ORDER — ENOXAPARIN SODIUM 30 MG/0.3ML ~~LOC~~ SOLN
30.0000 mg | SUBCUTANEOUS | Status: DC
  Administered 2012-12-23: 30 mg via SUBCUTANEOUS
  Filled 2012-12-23 (×2): qty 0.3

## 2012-12-23 MED ORDER — NITROGLYCERIN 0.4 MG SL SUBL: 0.4000 mg | SUBLINGUAL_TABLET | SUBLINGUAL | Status: DC | PRN

## 2012-12-23 MED ORDER — ATORVASTATIN CALCIUM 40 MG PO TABS
40.0000 mg | ORAL_TABLET | Freq: Every day | ORAL | Status: DC
  Administered 2012-12-24: 40 mg via ORAL
  Filled 2012-12-23 (×2): qty 1

## 2012-12-23 MED ORDER — PANTOPRAZOLE SODIUM 40 MG PO TBEC
40.0000 mg | DELAYED_RELEASE_TABLET | Freq: Every day | ORAL | Status: DC
  Administered 2012-12-23 – 2012-12-25 (×3): 40 mg via ORAL
  Filled 2012-12-23 (×3): qty 1

## 2012-12-23 MED ORDER — ONDANSETRON HCL 4 MG/2ML IJ SOLN: 4.0000 mg | Freq: Four times a day (QID) | INTRAMUSCULAR | Status: DC | PRN

## 2012-12-23 MED ORDER — POTASSIUM CHLORIDE CRYS ER 20 MEQ PO TBCR
40.0000 meq | EXTENDED_RELEASE_TABLET | Freq: Once | ORAL | Status: DC
  Filled 2012-12-23: qty 2

## 2012-12-23 MED ORDER — LATANOPROST 0.005 % OP SOLN
1.0000 [drp] | Freq: Every day | OPHTHALMIC | Status: DC
  Administered 2012-12-23: 1 [drp] via OPHTHALMIC
  Filled 2012-12-23 (×2): qty 2.5

## 2012-12-23 MED ORDER — SERTRALINE HCL 25 MG PO TABS
25.0000 mg | ORAL_TABLET | Freq: Every day | ORAL | Status: DC
  Administered 2012-12-24 – 2012-12-25 (×2): 25 mg via ORAL
  Filled 2012-12-23 (×2): qty 1

## 2012-12-23 MED ORDER — POTASSIUM CHLORIDE CRYS ER 20 MEQ PO TBCR
40.0000 meq | EXTENDED_RELEASE_TABLET | Freq: Once | ORAL | Status: AC
  Administered 2012-12-23: 40 meq via ORAL
  Filled 2012-12-23: qty 2

## 2012-12-23 MED ORDER — PNEUMOCOCCAL VAC POLYVALENT 25 MCG/0.5ML IJ INJ
0.5000 mL | INJECTION | INTRAMUSCULAR | Status: AC
  Administered 2012-12-24: 0.5 mL via INTRAMUSCULAR
  Filled 2012-12-23: qty 0.5

## 2012-12-23 MED ORDER — ACETAMINOPHEN 325 MG PO TABS
650.0000 mg | ORAL_TABLET | Freq: Four times a day (QID) | ORAL | Status: DC | PRN
  Filled 2012-12-23: qty 2

## 2012-12-23 MED ORDER — ASPIRIN EC 81 MG PO TBEC
81.0000 mg | DELAYED_RELEASE_TABLET | Freq: Every day | ORAL | Status: DC
  Administered 2012-12-24 – 2012-12-25 (×2): 81 mg via ORAL
  Filled 2012-12-23 (×2): qty 1

## 2012-12-23 MED ORDER — ACETAMINOPHEN 650 MG RE SUPP: 650.0000 mg | Freq: Four times a day (QID) | RECTAL | Status: DC | PRN

## 2012-12-23 MED ORDER — HYDRALAZINE HCL 10 MG PO TABS
10.0000 mg | ORAL_TABLET | Freq: Three times a day (TID) | ORAL | Status: DC
Start: 2012-12-23 — End: 2012-12-24
  Administered 2012-12-23 – 2012-12-24 (×2): 10 mg via ORAL
  Filled 2012-12-23 (×5): qty 1
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
## 2012-12-23 NOTE — ED Notes (Signed)
Critical value reported potassium 2.6 to EDP

## 2012-12-23 NOTE — ED Notes (Signed)
Admit Doctor at bedside.  
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
## 2012-12-23 NOTE — ED Notes (Signed)
EKG completed given to EDP.  

## 2012-12-23 NOTE — ED Notes (Signed)
Onset today 1630 while sitting down chest pain center of chest radiating to right side of chest.  EMS reported pain 4/10 pain gave 324mg  aspirin and one nitro SL pain improved to 0/10. IV left forearm 22g.

## 2012-12-24 DIAGNOSIS — R079 Chest pain, unspecified: Secondary | ICD-10-CM | POA: Diagnosis not present

## 2012-12-24 DIAGNOSIS — F0390 Unspecified dementia without behavioral disturbance: Secondary | ICD-10-CM | POA: Diagnosis not present

## 2012-12-24 DIAGNOSIS — I059 Rheumatic mitral valve disease, unspecified: Secondary | ICD-10-CM

## 2012-12-24 DIAGNOSIS — E119 Type 2 diabetes mellitus without complications: Secondary | ICD-10-CM | POA: Diagnosis not present

## 2012-12-24 DIAGNOSIS — E876 Hypokalemia: Secondary | ICD-10-CM | POA: Diagnosis not present

## 2012-12-24 DIAGNOSIS — I10 Essential (primary) hypertension: Secondary | ICD-10-CM | POA: Diagnosis not present

## 2012-12-24 LAB — CBC
HCT: 33.8 % — ABNORMAL LOW (ref 36.0–46.0)
Hemoglobin: 11.5 g/dL — ABNORMAL LOW (ref 12.0–15.0)
MCV: 85.4 fL (ref 78.0–100.0)
Platelets: 171 10*3/uL (ref 150–400)
RBC: 3.96 MIL/uL (ref 3.87–5.11)
WBC: 6.1 10*3/uL (ref 4.0–10.5)

## 2012-12-24 LAB — LIPID PANEL
HDL: 52 mg/dL (ref >39)
LDL Cholesterol: 60 mg/dL (ref 0–99)
Total CHOL/HDL Ratio: 2.6 RATIO
Triglycerides: 104 mg/dL (ref <150)
VLDL: 21 mg/dL (ref 0–40)

## 2012-12-24 LAB — TSH: TSH: 2.232 u[IU]/mL (ref 0.350–4.500)

## 2012-12-24 LAB — TROPONIN I
Troponin I: <0.3 ng/mL (ref <0.30)
Troponin I: <0.3 ng/mL (ref <0.30)

## 2012-12-24 LAB — BASIC METABOLIC PANEL
BUN: 14 mg/dL (ref 6–23)
CO2: 24 mEq/L (ref 19–32)
Glucose, Bld: 96 mg/dL (ref 70–99)
Potassium: 2.8 mEq/L — ABNORMAL LOW (ref 3.5–5.1)
Sodium: 145 mEq/L (ref 135–145)

## 2012-12-24 MED ORDER — HALOPERIDOL LACTATE 5 MG/ML IJ SOLN: 1.0000 mg | Freq: Four times a day (QID) | INTRAMUSCULAR | Status: DC | PRN

## 2012-12-24 MED ORDER — POTASSIUM CHLORIDE 10 MEQ/100ML IV SOLN
10.0000 meq | INTRAVENOUS | Status: AC
  Administered 2012-12-24 (×4): 10 meq via INTRAVENOUS
  Filled 2012-12-24 (×4): qty 100

## 2012-12-24 MED ORDER — LORAZEPAM 2 MG/ML IJ SOLN
1.0000 mg | Freq: Four times a day (QID) | INTRAMUSCULAR | Status: DC | PRN
  Administered 2012-12-24: 1 mg via INTRAVENOUS
  Filled 2012-12-24: qty 1

## 2012-12-24 MED ORDER — LORAZEPAM 2 MG/ML IJ SOLN: 1.0000 mg | Freq: Four times a day (QID) | INTRAMUSCULAR | Status: DC | PRN

## 2012-12-24 MED ORDER — HALOPERIDOL LACTATE 5 MG/ML IJ SOLN
1.0000 mg | Freq: Four times a day (QID) | INTRAMUSCULAR | Status: DC | PRN
  Administered 2012-12-24 – 2012-12-25 (×2): 1 mg via INTRAVENOUS
  Filled 2012-12-24 (×2): qty 0.2

## 2012-12-24 MED ORDER — POTASSIUM CHLORIDE CRYS ER 20 MEQ PO TBCR
40.0000 meq | EXTENDED_RELEASE_TABLET | Freq: Once | ORAL | Status: AC
  Administered 2012-12-24: 40 meq via ORAL

## 2012-12-24 MED ORDER — ENOXAPARIN SODIUM 40 MG/0.4ML ~~LOC~~ SOLN
40.0000 mg | Freq: Every day | SUBCUTANEOUS | Status: DC
  Filled 2012-12-24 (×2): qty 0.4

## 2012-12-24 MED ORDER — HALOPERIDOL LACTATE 5 MG/ML IJ SOLN
1.0000 mg | Freq: Four times a day (QID) | INTRAMUSCULAR | Status: DC | PRN
  Filled 2012-12-24: qty 0.2

## 2012-12-24 MED ORDER — HALOPERIDOL LACTATE 5 MG/ML IJ SOLN
1.0000 mg | Freq: Four times a day (QID) | INTRAMUSCULAR | Status: DC | PRN
Start: 2012-12-24 — End: 2012-12-24

## 2012-12-24 MED ORDER — METOPROLOL TARTRATE 12.5 MG HALF TABLET
12.5000 mg | ORAL_TABLET | Freq: Two times a day (BID) | ORAL | Status: DC
  Administered 2012-12-24 – 2012-12-25 (×3): 12.5 mg via ORAL
  Filled 2012-12-24 (×4): qty 1

## 2012-12-24 NOTE — Progress Notes (Signed)
Family was in visiting with patient for most of the day and now that family has to leave patient is extremely tearful. Patient has history of dementia/Alzheimers and was notified by family as well as in morning shift report that patient will get up and wander. Patient is a high risk fall patient for unsteady on feet but able to walk with cane. Patient is very anxious, tearful, and agitated and therefore notified on-call MD, Dr. Inda Merlin of this matter. Orders given for PRN haldol or PRN ativan for agitation and will administer as needed per order. Will continue to monitor patient and keep patient safe until end of shift.

## 2012-12-24 NOTE — Progress Notes (Signed)
  Echocardiogram 2D Echocardiogram has been performed.  Tanya Smith 12/24/2012, 11:20 AM

## 2012-12-24 NOTE — Progress Notes (Signed)
Subjective: Admission H&P reviewed. Patient admitted with chest pain, associated with shortness of breath. Documentation of relief of chest pain with nitroglycerin and aspirin. EKG without acute changes, shows right bundle, has been present on previous EKG in December. Echocardiogram pending, current cardiac enzymes within normal limits. Patient does have risk factors including hypertension diet-controlled diabetes, high cholesterol. Admitting labs did show hypokalemia. Patient's IV infiltrated over the night, her potassium is still low. Currently patient pleasant no apparent distress no complaint of chest pain. No arrhythmia noted on monitor, per nursing no problems overnight  Objective: Vital signs in last 24 hours: Temp:  [97.6 F (36.4 C)-98.2 F (36.8 C)] 97.8 F (36.6 C) (02/28 0645) Pulse Rate:  [60-67] 60 (02/28 0645) Resp:  [12-21] 18 (02/28 0645) BP: (137-164)/(63-110) 160/63 mmHg (02/28 0645) SpO2:  [96 %-100 %] 100 % (02/28 0645) Weight:  [91.3 kg (201 lb 4.5 oz)-91.445 kg (201 lb 9.6 oz)] 91.3 kg (201 lb 4.5 oz) (02/28 0645) Weight change:  Last BM Date: 12/23/12  Intake/Output from previous day: 02/27 0701 - 02/28 0700 In: 710 [P.O.:360; I.V.:50; IV Piggyback:300] Out: 1075 [Urine:1075] Intake/Output this shift: Total I/O In: 240 [P.O.:240] Out: -   General appearance: alert and cooperative Neck: no adenopathy, no carotid bruit, no JVD, supple, symmetrical, trachea midline and thyroid not enlarged, symmetric, no tenderness/mass/nodules Resp: clear to auscultation bilaterally Cardio: regular rate and rhythm, S1, S2 normal, no murmur, click, rub or gallop Extremities: extremities normal, atraumatic, no cyanosis or edema  Lab Results:  Results for orders placed during the hospital encounter of 12/23/12 (from the past 24 hour(s))  CBC WITH DIFFERENTIAL     Status: Abnormal   Collection Time    12/23/12  5:58 PM      Result Value Range   WBC 6.1  4.0 - 10.5 K/uL   RBC  4.12  3.87 - 5.11 MIL/uL   Hemoglobin 12.1  12.0 - 15.0 g/dL   HCT 34.9 (*) 36.0 - 46.0 %   MCV 84.7  78.0 - 100.0 fL   MCH 29.4  26.0 - 34.0 pg   MCHC 34.7  30.0 - 36.0 g/dL   RDW 15.6 (*) 11.5 - 15.5 %   Platelets 192  150 - 400 K/uL   Neutrophils Relative 49  43 - 77 %   Neutro Abs 3.0  1.7 - 7.7 K/uL   Lymphocytes Relative 40  12 - 46 %   Lymphs Abs 2.5  0.7 - 4.0 K/uL   Monocytes Relative 8  3 - 12 %   Monocytes Absolute 0.5  0.1 - 1.0 K/uL   Eosinophils Relative 3  0 - 5 %   Eosinophils Absolute 0.2  0.0 - 0.7 K/uL   Basophils Relative 1  0 - 1 %   Basophils Absolute 0.0  0.0 - 0.1 K/uL  COMPREHENSIVE METABOLIC PANEL     Status: Abnormal   Collection Time    12/23/12  5:58 PM      Result Value Range   Sodium 144  135 - 145 mEq/L   Potassium 2.6 (*) 3.5 - 5.1 mEq/L   Chloride 109  96 - 112 mEq/L   CO2 22  19 - 32 mEq/L   Glucose, Bld 95  70 - 99 mg/dL   BUN 16  6 - 23 mg/dL   Creatinine, Ser 1.40 (*) 0.50 - 1.10 mg/dL   Calcium 9.3  8.4 - 10.5 mg/dL   Total Protein 6.6  6.0 - 8.3 g/dL  Albumin 3.1 (*) 3.5 - 5.2 g/dL   AST 27  0 - 37 U/L   ALT 17  0 - 35 U/L   Alkaline Phosphatase 95  39 - 117 U/L   Total Bilirubin 0.3  0.3 - 1.2 mg/dL   GFR calc non Af Amer 34 (*) >90 mL/min   GFR calc Af Amer 40 (*) >90 mL/min  TROPONIN I     Status: None   Collection Time    12/23/12  5:59 PM      Result Value Range   Troponin I <0.30  <0.30 ng/mL  PRO B NATRIURETIC PEPTIDE     Status: Abnormal   Collection Time    12/23/12  5:59 PM      Result Value Range   Pro B Natriuretic peptide (BNP) 495.0 (*) 0 - 450 pg/mL  MAGNESIUM     Status: None   Collection Time    12/23/12  5:59 PM      Result Value Range   Magnesium 1.9  1.5 - 2.5 mg/dL  TSH     Status: None   Collection Time    12/23/12  7:59 PM      Result Value Range   TSH 2.232  0.350 - 4.500 uIU/mL  HEMOGLOBIN A1C     Status: Abnormal   Collection Time    12/23/12  7:59 PM      Result Value Range   Hemoglobin  A1C 6.2 (*) <5.7 %   Mean Plasma Glucose 131 (*) <117 mg/dL  TROPONIN I     Status: None   Collection Time    12/23/12  8:06 PM      Result Value Range   Troponin I <0.30  <0.30 ng/mL  GLUCOSE, CAPILLARY     Status: None   Collection Time    12/23/12  9:26 PM      Result Value Range   Glucose-Capillary 72  70 - 99 mg/dL  CBC     Status: Abnormal   Collection Time    12/23/12  9:58 PM      Result Value Range   WBC 6.7  4.0 - 10.5 K/uL   RBC 4.43  3.87 - 5.11 MIL/uL   Hemoglobin 12.9  12.0 - 15.0 g/dL   HCT 37.9  36.0 - 46.0 %   MCV 85.6  78.0 - 100.0 fL   MCH 29.1  26.0 - 34.0 pg   MCHC 34.0  30.0 - 36.0 g/dL   RDW 15.7 (*) 11.5 - 15.5 %   Platelets 197  150 - 400 K/uL  CREATININE, SERUM     Status: Abnormal   Collection Time    12/23/12  9:58 PM      Result Value Range   Creatinine, Ser 1.30 (*) 0.50 - 1.10 mg/dL   GFR calc non Af Amer 38 (*) >90 mL/min   GFR calc Af Amer 44 (*) >90 mL/min  TROPONIN I     Status: None   Collection Time    12/24/12  2:34 AM      Result Value Range   Troponin I <0.30  <0.30 ng/mL  BASIC METABOLIC PANEL     Status: Abnormal   Collection Time    12/24/12  2:35 AM      Result Value Range   Sodium 145  135 - 145 mEq/L   Potassium 2.8 (*) 3.5 - 5.1 mEq/L   Chloride 112  96 - 112 mEq/L   CO2 24  19 - 32 mEq/L   Glucose, Bld 96  70 - 99 mg/dL   BUN 14  6 - 23 mg/dL   Creatinine, Ser 1.31 (*) 0.50 - 1.10 mg/dL   Calcium 8.9  8.4 - 10.5 mg/dL   GFR calc non Af Amer 37 (*) >90 mL/min   GFR calc Af Amer 43 (*) >90 mL/min  CBC     Status: Abnormal   Collection Time    12/24/12  2:35 AM      Result Value Range   WBC 6.1  4.0 - 10.5 K/uL   RBC 3.96  3.87 - 5.11 MIL/uL   Hemoglobin 11.5 (*) 12.0 - 15.0 g/dL   HCT 33.8 (*) 36.0 - 46.0 %   MCV 85.4  78.0 - 100.0 fL   MCH 29.0  26.0 - 34.0 pg   MCHC 34.0  30.0 - 36.0 g/dL   RDW 15.7 (*) 11.5 - 15.5 %   Platelets 171  150 - 400 K/uL  LIPID PANEL     Status: None   Collection Time     12/24/12  2:35 AM      Result Value Range   Cholesterol 133  0 - 200 mg/dL   Triglycerides 104  <150 mg/dL   HDL 52  >39 mg/dL   Total CHOL/HDL Ratio 2.6     VLDL 21  0 - 40 mg/dL   LDL Cholesterol 60  0 - 99 mg/dL      Studies/Results: Dg Chest 2 View  12/23/2012  *RADIOLOGY REPORT*  Clinical Data: Chest pain, hypertension.  CHEST - 2 VIEW  Comparison: None.  Findings: Cardiomegaly.  Calcified tortuous aorta.  No infiltrates or failure.  No effusion or pneumothorax.  Hypertrophic left first rib articulation with the sternum.  IMPRESSION: Cardiomegaly, no active infiltrates or failure.  Similar appearance to priors.   Original Report Authenticated By: Rolla Flatten, M.D.     Medications:  Prior to Admission:  Prescriptions prior to admission  Medication Sig Dispense Refill  . bisacodyl (BISACODYL) 5 MG EC tablet Take 15 mg by mouth every evening.       . cetirizine (ZYRTEC) 10 MG tablet Take 10 mg by mouth daily.      . fluticasone (FLONASE) 50 MCG/ACT nasal spray Place 2 sprays into the nose daily as needed. As needed for allergies.      Marland Kitchen latanoprost (XALATAN) 0.005 % ophthalmic solution Place 1 drop into both eyes at bedtime.      . rosuvastatin (CRESTOR) 10 MG tablet Take 20 mg by mouth daily.       Marland Kitchen senna (SENOKOT) 8.6 MG tablet Take 2 tablets by mouth daily.       . sertraline (ZOLOFT) 25 MG tablet Take 25 mg by mouth daily.      . Travoprost, BAK Free, (TRAVATAN) 0.004 % SOLN ophthalmic solution Place 1 drop into both eyes at bedtime.       Scheduled: . aspirin EC  81 mg Oral Daily  . atorvastatin  40 mg Oral q1800  . bisacodyl  15 mg Oral QPM  . enoxaparin (LOVENOX) injection  30 mg Subcutaneous Q24H  . hydrALAZINE  10 mg Oral Q8H  . latanoprost  1 drop Both Eyes QHS  . loratadine  10 mg Oral Daily  . pantoprazole  40 mg Oral Daily  . pneumococcal 23 valent vaccine  0.5 mL Intramuscular Tomorrow-1000  . potassium chloride  10 mEq Intravenous Q1 Hr x 4  . potassium  chloride  40 mEq Oral Once  . sertraline  25 mg Oral Daily  . sodium chloride  3 mL Intravenous Q12H  . sodium chloride  3 mL Intravenous Q12H  . Travoprost (BAK Free)  1 drop Both Eyes QHS   Continuous:   Assessment/Plan: Chest pain with relief with nitroglycerin. EKG without acute changes, current cardiac enzymes unremarkable. Continue to cycle enzymes, replace potassium. Followup with 2-D echo. Consider cardiology input  . Patient does have history of dementia , which will make history unreliable. I did try calling family this morning. Hypertension, add beta blocker Hypokalemia replete IV Diet controlled diabetes Dementia Chronic kidney disease stage III     LOS: 1 day   Mikell Camp D 12/24/2012, 8:28 AM

## 2012-12-24 NOTE — Progress Notes (Signed)
Brief Nutrition Note:  RD pulled to pt for malnutrition screening tool, pt reports unsure if she has lost weight and poor appetite PTA.   Pt reports she eats well at home.  Wt Readings from Last 5 Encounters:  12/24/12 201 lb 4.5 oz (91.3 kg)  10/04/12 207 lb 8 oz (94.121 kg)  02/21/12 220 lb (99.791 kg)  19 lb weight loss in 10 months, not significant. Pt would benefit from ongoing slow weight loss.   Body mass index is 33.49 kg/(m^2). Obesity class 1 Diet: Heart Meal Completion: 85% breakfast  Chart reviewed, no nutrition interventions warranted at this time. Please consult as needed.    Orson Slick RD, LDN Pager [phone] After Hours pager [phone]

## 2012-12-24 NOTE — Progress Notes (Signed)
Utilization Review Completed.   Averie Meiner, RN, BSN Nurse Case Manager  [phone]  

## 2012-12-25 DIAGNOSIS — I10 Essential (primary) hypertension: Secondary | ICD-10-CM | POA: Diagnosis not present

## 2012-12-25 DIAGNOSIS — E876 Hypokalemia: Secondary | ICD-10-CM | POA: Diagnosis not present

## 2012-12-25 DIAGNOSIS — R079 Chest pain, unspecified: Secondary | ICD-10-CM | POA: Diagnosis not present

## 2012-12-25 DIAGNOSIS — F0390 Unspecified dementia without behavioral disturbance: Secondary | ICD-10-CM | POA: Diagnosis not present

## 2012-12-25 DIAGNOSIS — E119 Type 2 diabetes mellitus without complications: Secondary | ICD-10-CM | POA: Diagnosis not present

## 2012-12-25 LAB — GLUCOSE, CAPILLARY: Glucose-Capillary: 90 mg/dL (ref 70–99)

## 2012-12-25 MED ORDER — POTASSIUM CHLORIDE CRYS ER 20 MEQ PO TBCR: 20.0000 meq | EXTENDED_RELEASE_TABLET | Freq: Every day | ORAL | Status: DC

## 2012-12-25 MED ORDER — PANTOPRAZOLE SODIUM 40 MG PO TBEC: 40.0000 mg | DELAYED_RELEASE_TABLET | Freq: Every day | ORAL | Status: DC

## 2012-12-25 NOTE — Discharge Summary (Signed)
Physician Discharge Summary  NAME:Tanya Smith  II:[id number]  DOB: 1929-11-09   Admit date: 12/23/2012 Discharge date: 12/25/2012  Discharge Diagnoses:   Chest pain syndrome - resolved.  Quite possibly secondary to GERD with esophageal spasm.  Cycled cardiac enzymes negative.  No further chest pain during hospitalization.  Discharge home on PPI therapy with followup with Dr. Lavone Orn next week  Active Problems:   Hypertension - controlled   Diabetes mellitus - aware, controlled, CBG is less than 100   Dementia - had some sundowning at night but responded well to Haldol   Chest pain - resolved as above   Hypokalemia - hypokalemic on admission but will be discharged on 20 milligrams of potassium chloride daily.  Potassium on                           discharge much improved.  Potassium improved from 2.8 to 3.6.  Creatinine 1.31 on discharge    2-D echocardiogram with grade 1 diastolic dysfunction and EF of 60-65%    Renal ultrasound revealed bilateral renal cysts but no hydronephrosis   Discharge Physical Exam:  General Appearance: Alert, cooperative, no distress, appears stated age  Weight change: -0.225 kg (-7.9 oz)  Intake/Output Summary (Last 24 hours) at 12/25/12 0910 Last data filed at 12/25/12 0300  Gross per 24 hour  Intake    480 ml  Output    800 ml  Net   -320 ml   Filed Vitals:   12/24/12 1233 12/24/12 1425 12/24/12 2020 12/25/12 0402  BP: 145/65 148/74 140/76 142/74  Pulse: 71 65 66   Temp:  97.7 F (36.5 C) 97.1 F (36.2 C) 97.3 F (36.3 C)  TempSrc:  Oral Oral Oral  Resp: 18 18 18 18   Height:      Weight:    91.22 kg (201 lb 1.7 oz)  SpO2: 99% 99% 99% 100%   General appearance: alert and cooperative  Neck: no adenopathy, no carotid bruit, no JVD, supple, symmetrical, trachea midline and thyroid not enlarged, symmetric, no tenderness/mass/nodules  Resp: clear to auscultation bilaterally  Cardio: regular rate and rhythm, S1, S2 normal, no murmur, click,  rub or gallop  Extremities: extremities normal, atraumatic, no cyanosis or edema Neurological:  Patient is alert and conversive, mildly confused.  Very pleasant nonfocal exam   Discharge Condition: Improved, no chest pain  Hospital Course: Tanya Smith is a very pleasant 79 year old female with dementia, hypertension, chronic kidney disease who presented with chest pain.  Responded to nitroglycerin.  Cardiac enzymes were negative.  Potassium now at admission was 2.6.  Cycle cardiac enzymes that were negative.  PPI therapy started.  No further chest pain while hospitalized.  Patient noted to have right bundle branch block.  Plans are to have her followup as an outpatient with Dr. Lavone Orn this week and Dr. Laurann Montana and family can discuss whether or not further cardiac workup Tanya be warranted.  Condition on discharge improved.  Chronic kidney disease followed by Dr. Marval Regal.  Renal ultrasound revealed no hydronephrosis while admitted  Cardiac ultrasound revealed EF of 60-65% with grade 1 diastolic dysfunction.  Might consider calcium channel blocker as an outpatient.  He was relatively bradycardic on beta blocker therapy while hospitalized and beta blocker will be discontinued at discharge  Things to follow up in the outpatient setting: Monitor for recurrent chest pain or shortness of breath or fever  Consults: N./A.  Disposition: Discharged home with  family to supervise  Discharge Orders   Future Orders Complete By Expires     Call MD for:  persistant nausea and vomiting  As directed     Call MD for:  severe uncontrolled pain  As directed     Call MD for:  temperature >100.4  As directed     Diet - low sodium heart healthy  As directed     Discharge instructions  As directed     Comments:      New medications will be potassium and Protonix.  Chest pain could have been from acid reflux.  Would take Protonix, i.e. generic for Pantoprazole, 40 milligrams daily and potassium chloride 20  milliequivalents daily. Please call Monday morning [phone] or [phone] for appointment with Dr. Laurann Montana next week.  If chest pain returns over the weekend if she develops shortness of breath and/or sweating, and notify M.D. by: [id number]    Increase activity slowly  As directed         Medication List    TAKE these medications       bisacodyl 5 MG EC tablet  Generic drug:  bisacodyl  Take 15 mg by mouth every evening.     cetirizine 10 MG tablet  Commonly known as:  ZYRTEC  Take 10 mg by mouth daily.     fluticasone 50 MCG/ACT nasal spray  Commonly known as:  FLONASE  Place 2 sprays into the nose daily as needed. As needed for allergies.     latanoprost 0.005 % ophthalmic solution  Commonly known as:  XALATAN  Place 1 drop into both eyes at bedtime.     pantoprazole 40 MG tablet  Commonly known as:  PROTONIX  Take 1 tablet (40 mg total) by mouth daily.     potassium chloride SA 20 MEQ tablet  Commonly known as:  K-DUR,KLOR-CON  Take 1 tablet (20 mEq total) by mouth daily.     rosuvastatin 10 MG tablet  Commonly known as:  CRESTOR  Take 20 mg by mouth daily.     senna 8.6 MG tablet  Commonly known as:  SENOKOT  Take 2 tablets by mouth daily.     sertraline 25 MG tablet  Commonly known as:  ZOLOFT  Take 25 mg by mouth daily.     Travoprost (BAK Free) 0.004 % Soln ophthalmic solution  Commonly known as:  TRAVATAN  Place 1 drop into both eyes at bedtime.         The results of significant diagnostics from this hospitalization (including imaging, microbiology, ancillary and laboratory) are listed below for reference.    Significant Diagnostic Studies: Dg Chest 2 View  12/23/2012  *RADIOLOGY REPORT*  Clinical Data: Chest pain, hypertension.  CHEST - 2 VIEW  Comparison: None.  Findings: Cardiomegaly.  Calcified tortuous aorta.  No infiltrates or failure.  No effusion or pneumothorax.  Hypertrophic left first rib articulation with the sternum.  IMPRESSION:  Cardiomegaly, no active infiltrates or failure.  Similar appearance to priors.   Original Report Authenticated By: Rolla Flatten, M.D.    Ct Head Wo Contrast  12/07/2012  *RADIOLOGY REPORT*  Clinical Data: Dementia  CT HEAD WITHOUT CONTRAST  Technique:  Contiguous axial images were obtained from the base of the skull through the vertex without contrast.  Comparison: 11/01/2007  Findings: Cerebral volume is normal for age.  No hydrocephalous. Mild chronic microvascular ischemia in the white matter.  Wedge- shaped hypodensity left cerebellar hemisphere is new but appears to represent a  chronic infarct.  No acute infarct hemorrhage or mass lesion.  There is diffuse atherosclerotic disease.  Calvarium intact.  IMPRESSION: Chronic ischemic changes.  No acute abnormality.   Original Report Authenticated By: Carl Best, M.D.    US Renal  12/16/2012  *RADIOLOGY REPORT*  Clinical Data: Acute kidney injury  RENAL/URINARY TRACT ULTRASOUND  Technique: Renal ultrasound  Comparison:  03/08/2010.  Findings: Right kidney measures 9.9 cm in length.  No hydronephrosis or diagnostic renal calculus.  A mid pole cyst measures 2.2 x 1.8 x 1.5 cm.  The left kidney measures 8.8 cm in length.  No hydronephrosis or diagnostic renal calculus.  A upper pole cyst measures 1.4 x 1.4 x 1.3 cm.  There is bilateral renal cortical increased echogenicity probable due to atrophy.  Visualized urinary bladder is unremarkable.  IMPRESSION:  1.  No hydronephrosis or diagnostic renal calculus.  Bilateral renal cysts. 2.  Unremarkable visualized urinary bladder.   Original Report Authenticated By: Lahoma Crocker, M.D.     Microbiology: No results found for this or any previous visit (from the past 240 hour(s)).   Labs: Results for orders placed during the hospital encounter of 12/23/12  CBC WITH DIFFERENTIAL      Result Value Range   WBC 6.1  4.0 - 10.5 K/uL   RBC 4.12  3.87 - 5.11 MIL/uL   Hemoglobin 12.1  12.0 - 15.0 g/dL   HCT 34.9 (*) 36.0  - 46.0 %   MCV 84.7  78.0 - 100.0 fL   MCH 29.4  26.0 - 34.0 pg   MCHC 34.7  30.0 - 36.0 g/dL   RDW 15.6 (*) 11.5 - 15.5 %   Platelets 192  150 - 400 K/uL   Neutrophils Relative 49  43 - 77 %   Neutro Abs 3.0  1.7 - 7.7 K/uL   Lymphocytes Relative 40  12 - 46 %   Lymphs Abs 2.5  0.7 - 4.0 K/uL   Monocytes Relative 8  3 - 12 %   Monocytes Absolute 0.5  0.1 - 1.0 K/uL   Eosinophils Relative 3  0 - 5 %   Eosinophils Absolute 0.2  0.0 - 0.7 K/uL   Basophils Relative 1  0 - 1 %   Basophils Absolute 0.0  0.0 - 0.1 K/uL  COMPREHENSIVE METABOLIC PANEL      Result Value Range   Sodium 144  135 - 145 mEq/L   Potassium 2.6 (*) 3.5 - 5.1 mEq/L   Chloride 109  96 - 112 mEq/L   CO2 22  19 - 32 mEq/L   Glucose, Bld 95  70 - 99 mg/dL   BUN 16  6 - 23 mg/dL   Creatinine, Ser 1.40 (*) 0.50 - 1.10 mg/dL   Calcium 9.3  8.4 - 10.5 mg/dL   Total Protein 6.6  6.0 - 8.3 g/dL   Albumin 3.1 (*) 3.5 - 5.2 g/dL   AST 27  0 - 37 U/L   ALT 17  0 - 35 U/L   Alkaline Phosphatase 95  39 - 117 U/L   Total Bilirubin 0.3  0.3 - 1.2 mg/dL   GFR calc non Af Amer 34 (*) >90 mL/min   GFR calc Af Amer 40 (*) >90 mL/min  TROPONIN I      Result Value Range   Troponin I <0.30  <0.30 ng/mL  PRO B NATRIURETIC PEPTIDE      Result Value Range   Pro B Natriuretic peptide (BNP) 495.0 (*)  0 - 450 pg/mL  MAGNESIUM      Result Value Range   Magnesium 1.9  1.5 - 2.5 mg/dL  TSH      Result Value Range   TSH 2.232  0.350 - 4.500 uIU/mL  TROPONIN I      Result Value Range   Troponin I <0.30  <0.30 ng/mL  TROPONIN I      Result Value Range   Troponin I <0.30  <0.30 ng/mL  TROPONIN I      Result Value Range   Troponin I <0.30  <0.30 ng/mL  HEMOGLOBIN A1C      Result Value Range   Hemoglobin A1C 6.2 (*) <5.7 %   Mean Plasma Glucose 131 (*) <117 mg/dL  BASIC METABOLIC PANEL      Result Value Range   Sodium 145  135 - 145 mEq/L   Potassium 2.8 (*) 3.5 - 5.1 mEq/L   Chloride 112  96 - 112 mEq/L   CO2 24  19 - 32  mEq/L   Glucose, Bld 96  70 - 99 mg/dL   BUN 14  6 - 23 mg/dL   Creatinine, Ser 1.31 (*) 0.50 - 1.10 mg/dL   Calcium 8.9  8.4 - 10.5 mg/dL   GFR calc non Af Amer 37 (*) >90 mL/min   GFR calc Af Amer 43 (*) >90 mL/min  CBC      Result Value Range   WBC 6.7  4.0 - 10.5 K/uL   RBC 4.43  3.87 - 5.11 MIL/uL   Hemoglobin 12.9  12.0 - 15.0 g/dL   HCT 37.9  36.0 - 46.0 %   MCV 85.6  78.0 - 100.0 fL   MCH 29.1  26.0 - 34.0 pg   MCHC 34.0  30.0 - 36.0 g/dL   RDW 15.7 (*) 11.5 - 15.5 %   Platelets 197  150 - 400 K/uL  CREATININE, SERUM      Result Value Range   Creatinine, Ser 1.30 (*) 0.50 - 1.10 mg/dL   GFR calc non Af Amer 38 (*) >90 mL/min   GFR calc Af Amer 44 (*) >90 mL/min  CBC      Result Value Range   WBC 6.1  4.0 - 10.5 K/uL   RBC 3.96  3.87 - 5.11 MIL/uL   Hemoglobin 11.5 (*) 12.0 - 15.0 g/dL   HCT 33.8 (*) 36.0 - 46.0 %   MCV 85.4  78.0 - 100.0 fL   MCH 29.0  26.0 - 34.0 pg   MCHC 34.0  30.0 - 36.0 g/dL   RDW 15.7 (*) 11.5 - 15.5 %   Platelets 171  150 - 400 K/uL  GLUCOSE, CAPILLARY      Result Value Range   Glucose-Capillary 72  70 - 99 mg/dL  LIPID PANEL      Result Value Range   Cholesterol 133  0 - 200 mg/dL   Triglycerides 104  <150 mg/dL   HDL 52  >39 mg/dL   Total CHOL/HDL Ratio 2.6     VLDL 21  0 - 40 mg/dL   LDL Cholesterol 60  0 - 99 mg/dL  POTASSIUM      Result Value Range   Potassium 3.6  3.5 - 5.1 mEq/L  GLUCOSE, CAPILLARY      Result Value Range   Glucose-Capillary 93  70 - 99 mg/dL  GLUCOSE, CAPILLARY      Result Value Range   Glucose-Capillary 90  70 - 99 mg/dL  Time coordinating discharge: 35 minutes  Signed: Henrine Screws, MD 12/25/2012, 9:10 AM

## 2012-12-25 NOTE — Progress Notes (Signed)
Utilization review completed.  

## 2013-01-10 DIAGNOSIS — M779 Enthesopathy, unspecified: Secondary | ICD-10-CM | POA: Diagnosis not present

## 2013-01-10 DIAGNOSIS — Q828 Other specified congenital malformations of skin: Secondary | ICD-10-CM | POA: Diagnosis not present

## 2013-01-25 IMAGING — CR DG CHEST 2V
2 series · 2 of 2 positions shown · non-contrast
Comparison: Chest x-ray of 06/20/2011

CLINICAL DATA: Generalized weakness, confusion

CHEST - 2 VIEW

[x chest ap]
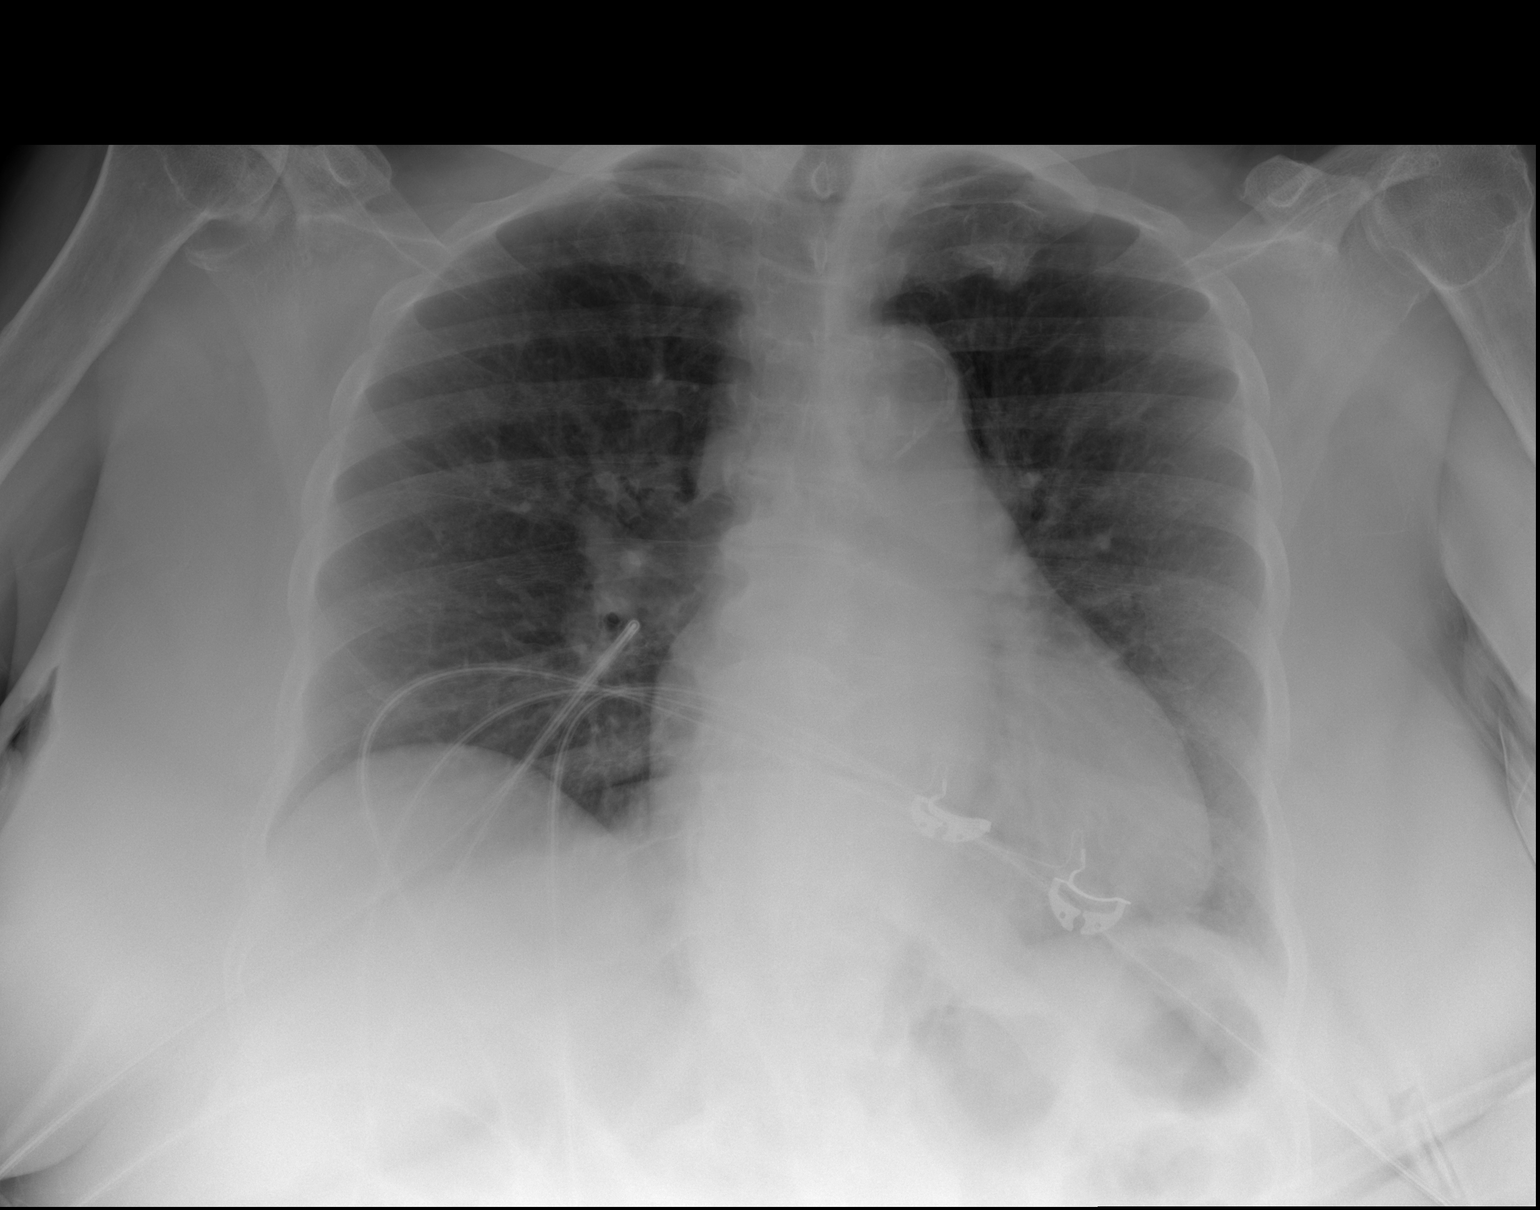

[w chest lat]
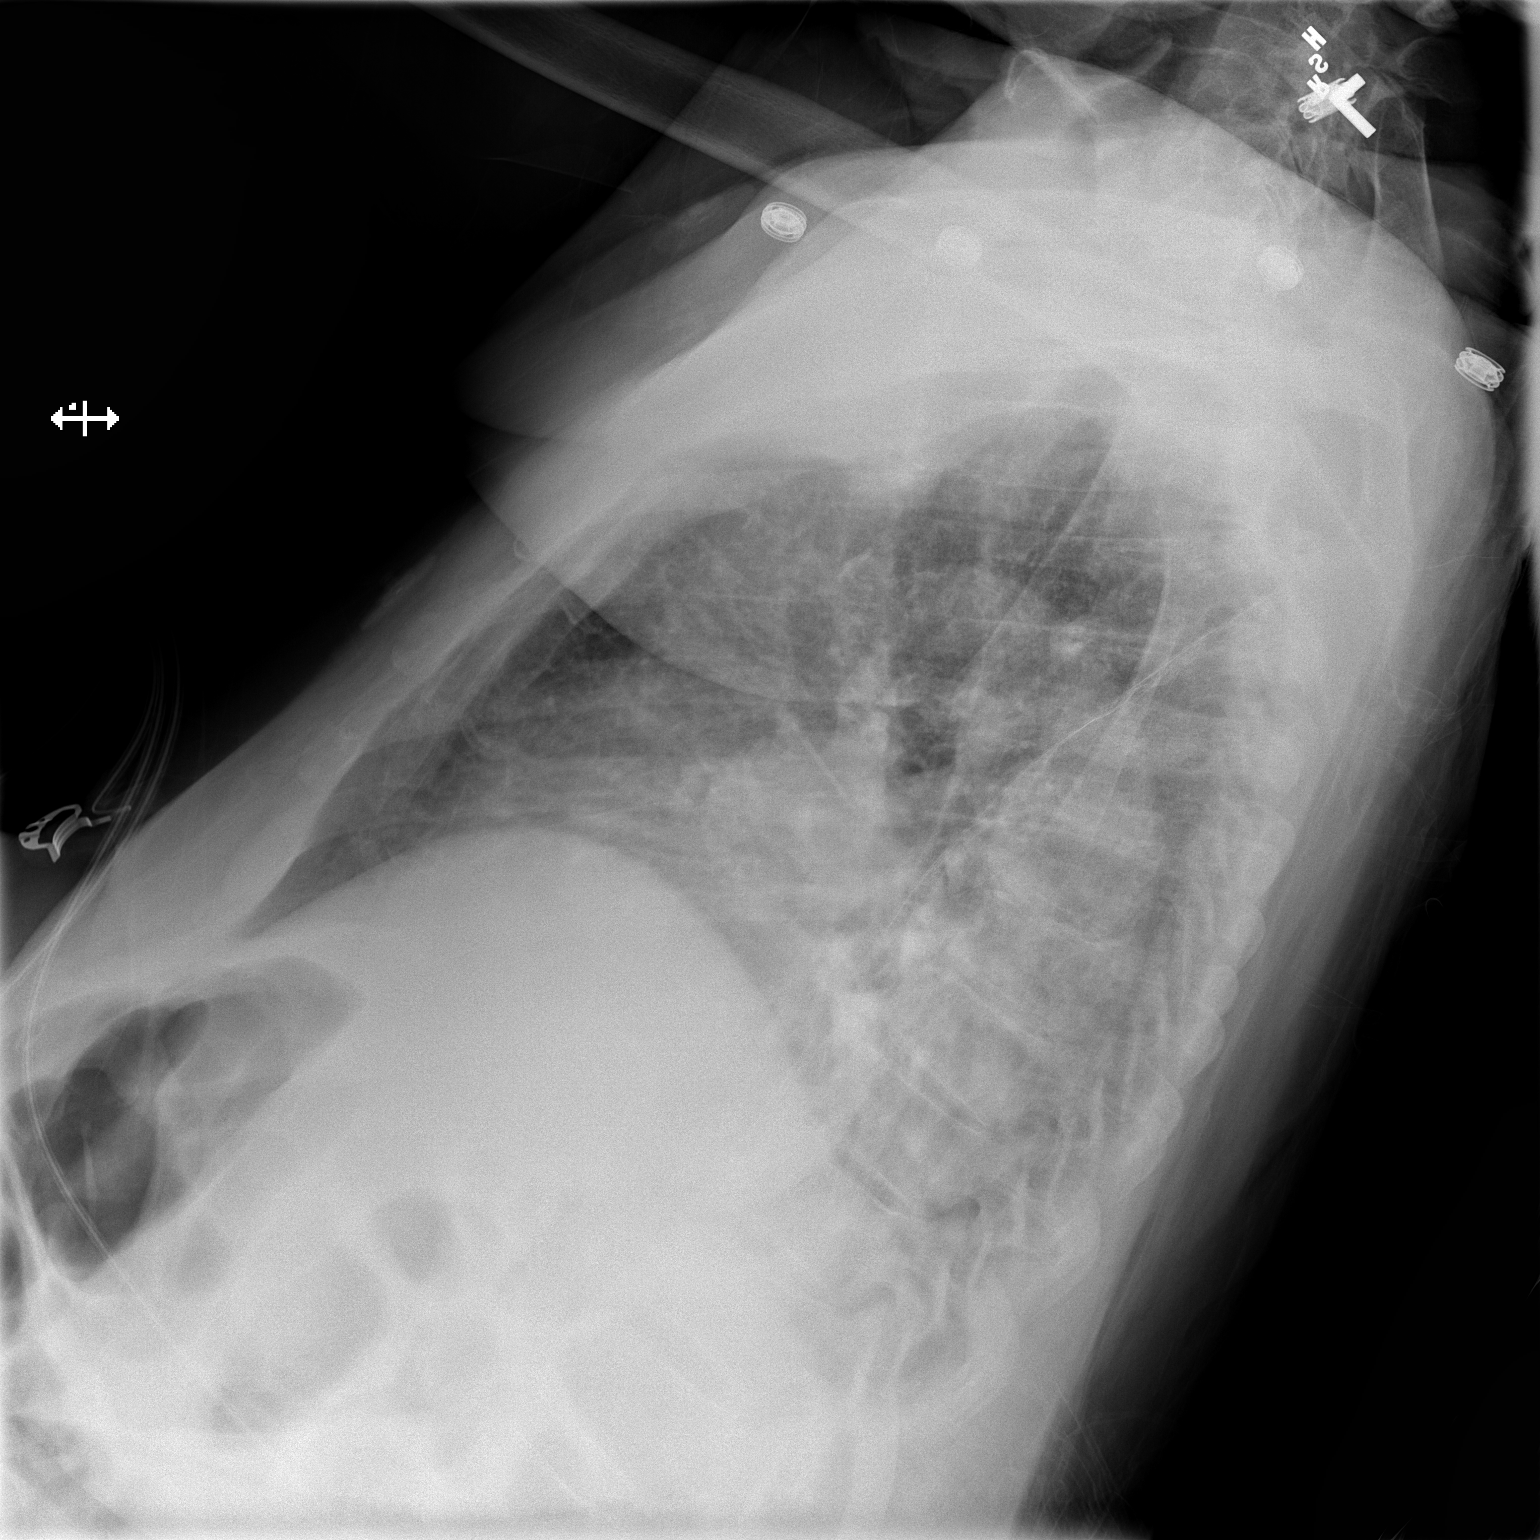

[2 of 2 positions shown; findings below may reference images not displayed]

FINDINGS: No active infiltrate or effusion is seen.  Cardiomegaly
is stable.  There are degenerative changes throughout the thoracic
spine and in both shoulders.
IMPRESSION: Stable cardiomegaly.  No active lung disease.

## 2013-01-26 DIAGNOSIS — E119 Type 2 diabetes mellitus without complications: Secondary | ICD-10-CM | POA: Diagnosis not present

## 2013-01-26 DIAGNOSIS — R609 Edema, unspecified: Secondary | ICD-10-CM | POA: Diagnosis not present

## 2013-01-26 DIAGNOSIS — H4011X0 Primary open-angle glaucoma, stage unspecified: Secondary | ICD-10-CM | POA: Diagnosis not present

## 2013-01-26 DIAGNOSIS — F0390 Unspecified dementia without behavioral disturbance: Secondary | ICD-10-CM | POA: Diagnosis not present

## 2013-02-23 DIAGNOSIS — Z111 Encounter for screening for respiratory tuberculosis: Secondary | ICD-10-CM | POA: Diagnosis not present

## 2013-02-24 DIAGNOSIS — I509 Heart failure, unspecified: Secondary | ICD-10-CM | POA: Diagnosis not present

## 2013-02-24 DIAGNOSIS — N189 Chronic kidney disease, unspecified: Secondary | ICD-10-CM | POA: Diagnosis not present

## 2013-02-24 DIAGNOSIS — E119 Type 2 diabetes mellitus without complications: Secondary | ICD-10-CM | POA: Diagnosis not present

## 2013-02-24 DIAGNOSIS — F0150 Vascular dementia without behavioral disturbance: Secondary | ICD-10-CM | POA: Diagnosis not present

## 2013-03-02 DIAGNOSIS — I509 Heart failure, unspecified: Secondary | ICD-10-CM | POA: Diagnosis not present

## 2013-03-02 DIAGNOSIS — E1129 Type 2 diabetes mellitus with other diabetic kidney complication: Secondary | ICD-10-CM | POA: Diagnosis not present

## 2013-03-02 DIAGNOSIS — F0150 Vascular dementia without behavioral disturbance: Secondary | ICD-10-CM | POA: Diagnosis not present

## 2013-03-02 DIAGNOSIS — N189 Chronic kidney disease, unspecified: Secondary | ICD-10-CM | POA: Diagnosis not present

## 2013-03-04 DIAGNOSIS — E119 Type 2 diabetes mellitus without complications: Secondary | ICD-10-CM | POA: Diagnosis not present

## 2013-03-04 DIAGNOSIS — I10 Essential (primary) hypertension: Secondary | ICD-10-CM | POA: Diagnosis not present

## 2013-03-09 DIAGNOSIS — E1129 Type 2 diabetes mellitus with other diabetic kidney complication: Secondary | ICD-10-CM | POA: Diagnosis not present

## 2013-03-09 DIAGNOSIS — F329 Major depressive disorder, single episode, unspecified: Secondary | ICD-10-CM | POA: Diagnosis not present

## 2013-03-09 DIAGNOSIS — F0150 Vascular dementia without behavioral disturbance: Secondary | ICD-10-CM | POA: Diagnosis not present

## 2013-03-09 DIAGNOSIS — I509 Heart failure, unspecified: Secondary | ICD-10-CM | POA: Diagnosis not present

## 2013-03-11 DIAGNOSIS — E1159 Type 2 diabetes mellitus with other circulatory complications: Secondary | ICD-10-CM | POA: Diagnosis not present

## 2013-03-11 DIAGNOSIS — L84 Corns and callosities: Secondary | ICD-10-CM | POA: Diagnosis not present

## 2013-03-11 DIAGNOSIS — L608 Other nail disorders: Secondary | ICD-10-CM | POA: Diagnosis not present

## 2013-03-16 DIAGNOSIS — I10 Essential (primary) hypertension: Secondary | ICD-10-CM | POA: Diagnosis not present

## 2013-03-16 DIAGNOSIS — N2581 Secondary hyperparathyroidism of renal origin: Secondary | ICD-10-CM | POA: Diagnosis not present

## 2013-03-16 DIAGNOSIS — D509 Iron deficiency anemia, unspecified: Secondary | ICD-10-CM | POA: Diagnosis not present

## 2013-03-16 DIAGNOSIS — N390 Urinary tract infection, site not specified: Secondary | ICD-10-CM | POA: Diagnosis not present

## 2013-03-16 DIAGNOSIS — N179 Acute kidney failure, unspecified: Secondary | ICD-10-CM | POA: Diagnosis not present

## 2013-03-23 ENCOUNTER — Encounter (HOSPITAL_COMMUNITY): Payer: Self-pay | Admitting: Emergency Medicine

## 2013-03-23 ENCOUNTER — Emergency Department (HOSPITAL_COMMUNITY)
Admission: EM | Admit: 2013-03-23 | Discharge: 2013-03-23 | Disposition: A | Discharge status: Home / Self Care | Payer: Medicare Other | Attending: Emergency Medicine | Admitting: Emergency Medicine

## 2013-03-23 DIAGNOSIS — R300 Dysuria: Secondary | ICD-10-CM | POA: Insufficient documentation

## 2013-03-23 DIAGNOSIS — H409 Unspecified glaucoma: Secondary | ICD-10-CM | POA: Insufficient documentation

## 2013-03-23 DIAGNOSIS — Z8719 Personal history of other diseases of the digestive system: Secondary | ICD-10-CM | POA: Insufficient documentation

## 2013-03-23 DIAGNOSIS — H9190 Unspecified hearing loss, unspecified ear: Secondary | ICD-10-CM | POA: Insufficient documentation

## 2013-03-23 DIAGNOSIS — I509 Heart failure, unspecified: Secondary | ICD-10-CM | POA: Diagnosis not present

## 2013-03-23 DIAGNOSIS — R350 Frequency of micturition: Secondary | ICD-10-CM | POA: Diagnosis not present

## 2013-03-23 DIAGNOSIS — Z79899 Other long term (current) drug therapy: Secondary | ICD-10-CM | POA: Insufficient documentation

## 2013-03-23 DIAGNOSIS — Z8639 Personal history of other endocrine, nutritional and metabolic disease: Secondary | ICD-10-CM | POA: Insufficient documentation

## 2013-03-23 DIAGNOSIS — Z91040 Latex allergy status: Secondary | ICD-10-CM | POA: Insufficient documentation

## 2013-03-23 DIAGNOSIS — G8929 Other chronic pain: Secondary | ICD-10-CM | POA: Diagnosis not present

## 2013-03-23 DIAGNOSIS — F0390 Unspecified dementia without behavioral disturbance: Secondary | ICD-10-CM | POA: Diagnosis not present

## 2013-03-23 DIAGNOSIS — M549 Dorsalgia, unspecified: Secondary | ICD-10-CM | POA: Diagnosis not present

## 2013-03-23 DIAGNOSIS — E1129 Type 2 diabetes mellitus with other diabetic kidney complication: Secondary | ICD-10-CM | POA: Diagnosis not present

## 2013-03-23 DIAGNOSIS — I10 Essential (primary) hypertension: Secondary | ICD-10-CM | POA: Insufficient documentation

## 2013-03-23 DIAGNOSIS — Z862 Personal history of diseases of the blood and blood-forming organs and certain disorders involving the immune mechanism: Secondary | ICD-10-CM | POA: Diagnosis not present

## 2013-03-23 DIAGNOSIS — F0150 Vascular dementia without behavioral disturbance: Secondary | ICD-10-CM | POA: Diagnosis not present

## 2013-03-23 DIAGNOSIS — N390 Urinary tract infection, site not specified: Secondary | ICD-10-CM | POA: Diagnosis not present

## 2013-03-23 DIAGNOSIS — Z87891 Personal history of nicotine dependence: Secondary | ICD-10-CM | POA: Diagnosis not present

## 2013-03-23 DIAGNOSIS — E119 Type 2 diabetes mellitus without complications: Secondary | ICD-10-CM | POA: Insufficient documentation

## 2013-03-23 DIAGNOSIS — Z87448 Personal history of other diseases of urinary system: Secondary | ICD-10-CM | POA: Diagnosis not present

## 2013-03-23 DIAGNOSIS — F329 Major depressive disorder, single episode, unspecified: Secondary | ICD-10-CM | POA: Diagnosis not present

## 2013-03-23 LAB — CBC WITH DIFFERENTIAL/PLATELET
Eosinophils Absolute: 0.1 10*3/uL (ref 0.0–0.7)
Eosinophils Relative: 1 % (ref 0–5)
Hemoglobin: 12.4 g/dL (ref 12.0–15.0)
Lymphs Abs: 1.7 10*3/uL (ref 0.7–4.0)
MCH: 28.8 pg (ref 26.0–34.0)
MCHC: 33 g/dL (ref 30.0–36.0)
MCV: 87.2 fL (ref 78.0–100.0)
Monocytes Absolute: 0.5 10*3/uL (ref 0.1–1.0)
Monocytes Relative: 10 % (ref 3–12)
RBC: 4.31 MIL/uL (ref 3.87–5.11)

## 2013-03-23 LAB — BASIC METABOLIC PANEL
BUN: 21 mg/dL (ref 6–23)
Calcium: 9.5 mg/dL (ref 8.4–10.5)
Creatinine, Ser: 1.41 mg/dL — ABNORMAL HIGH (ref 0.50–1.10)
GFR calc non Af Amer: 34 mL/min — ABNORMAL LOW (ref >90)
Glucose, Bld: 100 mg/dL — ABNORMAL HIGH (ref 70–99)

## 2013-03-23 LAB — URINALYSIS, ROUTINE W REFLEX MICROSCOPIC
Glucose, UA: NEGATIVE mg/dL
Protein, ur: 100 mg/dL — AB
Specific Gravity, Urine: 1.023 (ref 1.005–1.030)
Urobilinogen, UA: 0.2 mg/dL (ref 0.0–1.0)

## 2013-03-23 LAB — URINE MICROSCOPIC-ADD ON

## 2013-03-23 MED ORDER — NITROFURANTOIN MONOHYD MACRO 100 MG PO CAPS: 100.0000 mg | ORAL_CAPSULE | Freq: Two times a day (BID) | ORAL | Status: DC

## 2013-03-23 NOTE — ED Notes (Signed)
C/o lower abd, perineal area pain & dysuria since last night.

## 2013-03-23 NOTE — ED Notes (Signed)
freq and urgency when she voids since last night

## 2013-03-23 NOTE — ED Notes (Signed)
Pt has tried to give [place] a urine sample but was unable
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
## 2013-03-23 NOTE — ED Notes (Signed)
Unable to obtain discharge vital signs, family member stated was in a hurry. Was found starting to walk out of department. Was able to give D/c paperwork & prescription

## 2013-03-24 LAB — URINE CULTURE
Colony Count: NO GROWTH
Special Requests: NORMAL

## 2013-03-25 NOTE — ED Provider Notes (Signed)
Medical screening examination/treatment/procedure(s) were conducted as a shared visit with non-physician practitioner(s) and myself.  I personally evaluated the patient during the encounter.   The patient presents with abdominal pain but soft and benign abdominal exam whose workup only revealed urinary tract infection. Patient treated as an outpatient with antibiotic coverage.  Orpah Greek, MD 03/25/13 1106

## 2013-03-30 DIAGNOSIS — F329 Major depressive disorder, single episode, unspecified: Secondary | ICD-10-CM | POA: Diagnosis not present

## 2013-03-30 DIAGNOSIS — I509 Heart failure, unspecified: Secondary | ICD-10-CM | POA: Diagnosis not present

## 2013-03-30 DIAGNOSIS — F0150 Vascular dementia without behavioral disturbance: Secondary | ICD-10-CM | POA: Diagnosis not present

## 2013-03-30 DIAGNOSIS — E1129 Type 2 diabetes mellitus with other diabetic kidney complication: Secondary | ICD-10-CM | POA: Diagnosis not present

## 2013-03-31 ENCOUNTER — Encounter (HOSPITAL_COMMUNITY): Payer: Self-pay | Admitting: Emergency Medicine

## 2013-03-31 ENCOUNTER — Emergency Department (HOSPITAL_COMMUNITY)
Admission: EM | Admit: 2013-03-31 | Discharge: 2013-03-31 | Disposition: A | Discharge status: Home / Self Care | Payer: Medicare Other | Attending: Emergency Medicine | Admitting: Emergency Medicine

## 2013-03-31 DIAGNOSIS — IMO0002 Reserved for concepts with insufficient information to code with codable children: Secondary | ICD-10-CM | POA: Diagnosis not present

## 2013-03-31 DIAGNOSIS — H9190 Unspecified hearing loss, unspecified ear: Secondary | ICD-10-CM | POA: Insufficient documentation

## 2013-03-31 DIAGNOSIS — M549 Dorsalgia, unspecified: Secondary | ICD-10-CM | POA: Insufficient documentation

## 2013-03-31 DIAGNOSIS — I10 Essential (primary) hypertension: Secondary | ICD-10-CM | POA: Diagnosis not present

## 2013-03-31 DIAGNOSIS — Z87891 Personal history of nicotine dependence: Secondary | ICD-10-CM | POA: Insufficient documentation

## 2013-03-31 DIAGNOSIS — Z79899 Other long term (current) drug therapy: Secondary | ICD-10-CM | POA: Diagnosis not present

## 2013-03-31 DIAGNOSIS — Z87440 Personal history of urinary (tract) infections: Secondary | ICD-10-CM | POA: Insufficient documentation

## 2013-03-31 DIAGNOSIS — G8929 Other chronic pain: Secondary | ICD-10-CM | POA: Insufficient documentation

## 2013-03-31 DIAGNOSIS — Z8739 Personal history of other diseases of the musculoskeletal system and connective tissue: Secondary | ICD-10-CM | POA: Insufficient documentation

## 2013-03-31 DIAGNOSIS — Z87448 Personal history of other diseases of urinary system: Secondary | ICD-10-CM | POA: Diagnosis not present

## 2013-03-31 DIAGNOSIS — H409 Unspecified glaucoma: Secondary | ICD-10-CM | POA: Insufficient documentation

## 2013-03-31 DIAGNOSIS — E119 Type 2 diabetes mellitus without complications: Secondary | ICD-10-CM | POA: Insufficient documentation

## 2013-03-31 DIAGNOSIS — E876 Hypokalemia: Secondary | ICD-10-CM | POA: Diagnosis not present

## 2013-03-31 DIAGNOSIS — R5381 Other malaise: Secondary | ICD-10-CM | POA: Insufficient documentation

## 2013-03-31 DIAGNOSIS — F0390 Unspecified dementia without behavioral disturbance: Secondary | ICD-10-CM | POA: Insufficient documentation

## 2013-03-31 DIAGNOSIS — N289 Disorder of kidney and ureter, unspecified: Secondary | ICD-10-CM | POA: Diagnosis not present

## 2013-03-31 DIAGNOSIS — J029 Acute pharyngitis, unspecified: Secondary | ICD-10-CM | POA: Diagnosis not present

## 2013-03-31 DIAGNOSIS — R51 Headache: Secondary | ICD-10-CM | POA: Diagnosis not present

## 2013-03-31 DIAGNOSIS — Z91040 Latex allergy status: Secondary | ICD-10-CM | POA: Insufficient documentation

## 2013-03-31 DIAGNOSIS — I509 Heart failure, unspecified: Secondary | ICD-10-CM | POA: Diagnosis not present

## 2013-03-31 DIAGNOSIS — R5383 Other fatigue: Secondary | ICD-10-CM | POA: Insufficient documentation

## 2013-03-31 DIAGNOSIS — M109 Gout, unspecified: Secondary | ICD-10-CM | POA: Diagnosis not present

## 2013-03-31 LAB — CBC WITH DIFFERENTIAL/PLATELET
HCT: 34.9 % — ABNORMAL LOW (ref 36.0–46.0)
Hemoglobin: 11.7 g/dL — ABNORMAL LOW (ref 12.0–15.0)
Lymphs Abs: 2.2 10*3/uL (ref 0.7–4.0)
Monocytes Relative: 8 % (ref 3–12)
Neutro Abs: 2.8 10*3/uL (ref 1.7–7.7)
Neutrophils Relative %: 51 % (ref 43–77)
RBC: 4.09 MIL/uL (ref 3.87–5.11)

## 2013-03-31 LAB — POCT I-STAT, CHEM 8
BUN: 24 mg/dL — ABNORMAL HIGH (ref 6–23)
Chloride: 108 mEq/L (ref 96–112)
Creatinine, Ser: 1.6 mg/dL — ABNORMAL HIGH (ref 0.50–1.10)
Glucose, Bld: 87 mg/dL (ref 70–99)
HCT: 36 % (ref 36.0–46.0)
Potassium: 3.3 mEq/L — ABNORMAL LOW (ref 3.5–5.1)

## 2013-03-31 LAB — URINALYSIS, ROUTINE W REFLEX MICROSCOPIC
Bilirubin Urine: NEGATIVE
Glucose, UA: NEGATIVE mg/dL
Leukocytes, UA: NEGATIVE
Nitrite: NEGATIVE
Specific Gravity, Urine: 1.012 (ref 1.005–1.030)
pH: 5.5 (ref 5.0–8.0)

## 2013-03-31 MED ORDER — FLUCONAZOLE 150 MG PO TABS
150.0000 mg | ORAL_TABLET | Freq: Every day | ORAL | Status: DC
  Administered 2013-03-31: 150 mg via ORAL
  Filled 2013-03-31: qty 1
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
## 2013-03-31 NOTE — ED Notes (Signed)
Pt statest hat she is coming from assisted living facility for sore throat, headache, just not feeling well. Alert x4,

## 2013-03-31 NOTE — Progress Notes (Signed)
CSW met with pt and pt family at bedside and confirmed patient is a resident at [facility] and plans to return when medically stable.   Dorathy Kinsman, [place] .03/31/2013 1522pm

## 2013-04-01 DIAGNOSIS — R609 Edema, unspecified: Secondary | ICD-10-CM | POA: Diagnosis not present

## 2013-04-01 DIAGNOSIS — N905 Atrophy of vulva: Secondary | ICD-10-CM | POA: Diagnosis not present

## 2013-04-18 DIAGNOSIS — N760 Acute vaginitis: Secondary | ICD-10-CM | POA: Diagnosis not present

## 2013-05-06 ENCOUNTER — Encounter: Payer: Self-pay | Admitting: Diagnostic Neuroimaging

## 2013-05-06 ENCOUNTER — Ambulatory Visit (INDEPENDENT_AMBULATORY_CARE_PROVIDER_SITE_OTHER): Payer: Medicare Other | Admitting: Diagnostic Neuroimaging

## 2013-05-06 VITALS — BP 118/71 | HR 56 | Temp 98.6°F | Ht 62.0 in | Wt 194.0 lb

## 2013-05-06 DIAGNOSIS — F411 Generalized anxiety disorder: Secondary | ICD-10-CM

## 2013-05-06 DIAGNOSIS — F329 Major depressive disorder, single episode, unspecified: Secondary | ICD-10-CM

## 2013-05-06 DIAGNOSIS — F0391 Unspecified dementia with behavioral disturbance: Secondary | ICD-10-CM

## 2013-05-06 DIAGNOSIS — F32A Depression, unspecified: Secondary | ICD-10-CM

## 2013-05-06 DIAGNOSIS — F419 Anxiety disorder, unspecified: Secondary | ICD-10-CM

## 2013-05-06 DIAGNOSIS — F03B18 Unspecified dementia, moderate, with other behavioral disturbance: Secondary | ICD-10-CM
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
## 2013-05-06 NOTE — Patient Instructions (Signed)
Follow up with PCP or psychiatry regarding mood fluctuations.

## 2013-06-08 ENCOUNTER — Emergency Department (HOSPITAL_COMMUNITY): Payer: Medicare Other

## 2013-06-08 ENCOUNTER — Encounter (HOSPITAL_COMMUNITY): Payer: Self-pay | Admitting: Emergency Medicine

## 2013-06-08 ENCOUNTER — Emergency Department (HOSPITAL_COMMUNITY)
Admission: EM | Admit: 2013-06-08 | Discharge: 2013-06-08 | Disposition: A | Discharge status: Home / Self Care | Payer: Medicare Other | Attending: Emergency Medicine | Admitting: Emergency Medicine

## 2013-06-08 DIAGNOSIS — Z91040 Latex allergy status: Secondary | ICD-10-CM | POA: Insufficient documentation

## 2013-06-08 DIAGNOSIS — I129 Hypertensive chronic kidney disease with stage 1 through stage 4 chronic kidney disease, or unspecified chronic kidney disease: Secondary | ICD-10-CM | POA: Insufficient documentation

## 2013-06-08 DIAGNOSIS — Z87891 Personal history of nicotine dependence: Secondary | ICD-10-CM | POA: Diagnosis not present

## 2013-06-08 DIAGNOSIS — R079 Chest pain, unspecified: Secondary | ICD-10-CM | POA: Diagnosis not present

## 2013-06-08 DIAGNOSIS — M109 Gout, unspecified: Secondary | ICD-10-CM | POA: Insufficient documentation

## 2013-06-08 DIAGNOSIS — F3289 Other specified depressive episodes: Secondary | ICD-10-CM | POA: Insufficient documentation

## 2013-06-08 DIAGNOSIS — R011 Cardiac murmur, unspecified: Secondary | ICD-10-CM | POA: Diagnosis not present

## 2013-06-08 DIAGNOSIS — Z8719 Personal history of other diseases of the digestive system: Secondary | ICD-10-CM | POA: Insufficient documentation

## 2013-06-08 DIAGNOSIS — M7989 Other specified soft tissue disorders: Secondary | ICD-10-CM | POA: Insufficient documentation

## 2013-06-08 DIAGNOSIS — IMO0002 Reserved for concepts with insufficient information to code with codable children: Secondary | ICD-10-CM | POA: Diagnosis not present

## 2013-06-08 DIAGNOSIS — G8929 Other chronic pain: Secondary | ICD-10-CM | POA: Diagnosis not present

## 2013-06-08 DIAGNOSIS — R609 Edema, unspecified: Secondary | ICD-10-CM | POA: Insufficient documentation

## 2013-06-08 DIAGNOSIS — N189 Chronic kidney disease, unspecified: Secondary | ICD-10-CM | POA: Insufficient documentation

## 2013-06-08 DIAGNOSIS — F29 Unspecified psychosis not due to a substance or known physiological condition: Secondary | ICD-10-CM | POA: Insufficient documentation

## 2013-06-08 DIAGNOSIS — E7800 Pure hypercholesterolemia, unspecified: Secondary | ICD-10-CM | POA: Insufficient documentation

## 2013-06-08 DIAGNOSIS — F411 Generalized anxiety disorder: Secondary | ICD-10-CM | POA: Diagnosis not present

## 2013-06-08 DIAGNOSIS — R5381 Other malaise: Secondary | ICD-10-CM | POA: Insufficient documentation

## 2013-06-08 DIAGNOSIS — Z79899 Other long term (current) drug therapy: Secondary | ICD-10-CM | POA: Diagnosis not present

## 2013-06-08 DIAGNOSIS — Z8669 Personal history of other diseases of the nervous system and sense organs: Secondary | ICD-10-CM | POA: Diagnosis not present

## 2013-06-08 DIAGNOSIS — I5030 Unspecified diastolic (congestive) heart failure: Secondary | ICD-10-CM | POA: Insufficient documentation

## 2013-06-08 DIAGNOSIS — Z87448 Personal history of other diseases of urinary system: Secondary | ICD-10-CM | POA: Diagnosis not present

## 2013-06-08 DIAGNOSIS — H9190 Unspecified hearing loss, unspecified ear: Secondary | ICD-10-CM | POA: Insufficient documentation

## 2013-06-08 DIAGNOSIS — R6889 Other general symptoms and signs: Secondary | ICD-10-CM | POA: Diagnosis not present

## 2013-06-08 DIAGNOSIS — F329 Major depressive disorder, single episode, unspecified: Secondary | ICD-10-CM

## 2013-06-08 DIAGNOSIS — E119 Type 2 diabetes mellitus without complications: Secondary | ICD-10-CM | POA: Diagnosis not present

## 2013-06-08 DIAGNOSIS — Z8659 Personal history of other mental and behavioral disorders: Secondary | ICD-10-CM | POA: Diagnosis not present

## 2013-06-08 DIAGNOSIS — R5383 Other fatigue: Secondary | ICD-10-CM | POA: Insufficient documentation

## 2013-06-08 LAB — CBC WITH DIFFERENTIAL/PLATELET
Basophils Relative: 0 % (ref 0–1)
HCT: 35.2 % — ABNORMAL LOW (ref 36.0–46.0)
Hemoglobin: 12 g/dL (ref 12.0–15.0)
MCH: 29.6 pg (ref 26.0–34.0)
MCHC: 34.1 g/dL (ref 30.0–36.0)
MCV: 86.7 fL (ref 78.0–100.0)
Monocytes Absolute: 0.7 10*3/uL (ref 0.1–1.0)
Monocytes Relative: 7 % (ref 3–12)
Neutro Abs: 5.8 10*3/uL (ref 1.7–7.7)

## 2013-06-08 LAB — COMPREHENSIVE METABOLIC PANEL
Albumin: 3.2 g/dL — ABNORMAL LOW (ref 3.5–5.2)
BUN: 15 mg/dL (ref 6–23)
Chloride: 105 mEq/L (ref 96–112)
Creatinine, Ser: 1.25 mg/dL — ABNORMAL HIGH (ref 0.50–1.10)
GFR calc non Af Amer: 39 mL/min — ABNORMAL LOW (ref >90)
Total Bilirubin: 0.4 mg/dL (ref 0.3–1.2)

## 2013-06-08 LAB — PRO B NATRIURETIC PEPTIDE: Pro B Natriuretic peptide (BNP): 742.5 pg/mL — ABNORMAL HIGH (ref 0–450)

## 2013-06-08 LAB — LIPASE, BLOOD: Lipase: 41 U/L (ref 11–59)

## 2013-06-08 LAB — GLUCOSE, CAPILLARY: Glucose-Capillary: 82 mg/dL (ref 70–99)

## 2013-06-08 NOTE — ED Notes (Signed)
Portable xray at bedside.

## 2013-06-08 NOTE — ED Notes (Signed)
Family at bedside, family is very concerned about pt's blood glucose, pt's family states pt is a "sun-downer" and that she needs to eat food before her BG drops fast. Pt's family member inquiring about a 2nd chest xray.

## 2013-06-08 NOTE — ED Notes (Signed)
Per EMS: chest pain starting sometime yesterday. Pt from [place] home. Pt ambulatory with cane. Pt has hx of dementia and alzheimer's. Pt unable to describe pain in chest. Pain comes and goes. Pt take medications for GERD. Pt will point to epigastric area about pain. 324 ASA given en route. 22G left forearm. Pt is diabetic. CBG 98. Unremarkable 12 lead 133/74 95% RA.
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
## 2013-06-08 NOTE — ED Notes (Signed)
MD at bedside. 

## 2013-06-08 NOTE — ED Notes (Signed)
Pt given [place] sandwich, applesauce, and apple juice per MD's approval.

## 2013-06-09 NOTE — ED Provider Notes (Signed)
I saw and evaluated the patient, reviewed the resident's note and I agree with the findings and plan. Patient with chest pain. Recent rule out for same. Discussed with PCP. Patient does not want aggressive treatment. We'll get the second set of enzymes and discharged home PCP followup.   Date: 06/09/2013  Rate: 64  Rhythm: normal sinus rhythm  QRS Axis: normal  Intervals: normal  ST/T Wave abnormalities: normal  Conduction Disutrbances:right bundle branch block  Narrative Interpretation:   Old EKG Reviewed: unchanged    Jantzen Pilger R. Alvino Chapel, [place] 06/09/13 [phone]

## 2013-06-10 DIAGNOSIS — R079 Chest pain, unspecified: Secondary | ICD-10-CM | POA: Diagnosis not present

## 2013-06-10 DIAGNOSIS — F0390 Unspecified dementia without behavioral disturbance: Secondary | ICD-10-CM | POA: Diagnosis not present

## 2013-06-10 DIAGNOSIS — F329 Major depressive disorder, single episode, unspecified: Secondary | ICD-10-CM | POA: Diagnosis not present

## 2013-06-10 DIAGNOSIS — R300 Dysuria: Secondary | ICD-10-CM | POA: Diagnosis not present

## 2013-06-14 ENCOUNTER — Encounter (HOSPITAL_COMMUNITY): Payer: Self-pay | Admitting: Emergency Medicine

## 2013-06-14 ENCOUNTER — Emergency Department (HOSPITAL_COMMUNITY): Payer: Medicare Other

## 2013-06-14 ENCOUNTER — Emergency Department (HOSPITAL_COMMUNITY)
Admission: EM | Admit: 2013-06-14 | Discharge: 2013-06-14 | Disposition: A | Discharge status: Skilled Nursing Facility | Payer: Medicare Other | Attending: Emergency Medicine | Admitting: Emergency Medicine

## 2013-06-14 DIAGNOSIS — S9030XA Contusion of unspecified foot, initial encounter: Secondary | ICD-10-CM | POA: Diagnosis not present

## 2013-06-14 DIAGNOSIS — Z9889 Other specified postprocedural states: Secondary | ICD-10-CM | POA: Insufficient documentation

## 2013-06-14 DIAGNOSIS — IMO0002 Reserved for concepts with insufficient information to code with codable children: Secondary | ICD-10-CM | POA: Insufficient documentation

## 2013-06-14 DIAGNOSIS — E7800 Pure hypercholesterolemia, unspecified: Secondary | ICD-10-CM | POA: Diagnosis not present

## 2013-06-14 DIAGNOSIS — I509 Heart failure, unspecified: Secondary | ICD-10-CM | POA: Insufficient documentation

## 2013-06-14 DIAGNOSIS — M109 Gout, unspecified: Secondary | ICD-10-CM | POA: Insufficient documentation

## 2013-06-14 DIAGNOSIS — H409 Unspecified glaucoma: Secondary | ICD-10-CM | POA: Diagnosis not present

## 2013-06-14 DIAGNOSIS — Y9389 Activity, other specified: Secondary | ICD-10-CM | POA: Insufficient documentation

## 2013-06-14 DIAGNOSIS — T1490XA Injury, unspecified, initial encounter: Secondary | ICD-10-CM | POA: Diagnosis not present

## 2013-06-14 DIAGNOSIS — W19XXXA Unspecified fall, initial encounter: Secondary | ICD-10-CM

## 2013-06-14 DIAGNOSIS — S0993XA Unspecified injury of face, initial encounter: Secondary | ICD-10-CM | POA: Insufficient documentation

## 2013-06-14 DIAGNOSIS — S0990XA Unspecified injury of head, initial encounter: Secondary | ICD-10-CM | POA: Insufficient documentation

## 2013-06-14 DIAGNOSIS — E119 Type 2 diabetes mellitus without complications: Secondary | ICD-10-CM | POA: Diagnosis not present

## 2013-06-14 DIAGNOSIS — F0390 Unspecified dementia without behavioral disturbance: Secondary | ICD-10-CM

## 2013-06-14 DIAGNOSIS — I10 Essential (primary) hypertension: Secondary | ICD-10-CM | POA: Insufficient documentation

## 2013-06-14 DIAGNOSIS — F329 Major depressive disorder, single episode, unspecified: Secondary | ICD-10-CM | POA: Insufficient documentation

## 2013-06-14 DIAGNOSIS — Z91040 Latex allergy status: Secondary | ICD-10-CM | POA: Insufficient documentation

## 2013-06-14 DIAGNOSIS — Z87891 Personal history of nicotine dependence: Secondary | ICD-10-CM | POA: Diagnosis not present

## 2013-06-14 DIAGNOSIS — B351 Tinea unguium: Secondary | ICD-10-CM | POA: Diagnosis not present

## 2013-06-14 DIAGNOSIS — F3289 Other specified depressive episodes: Secondary | ICD-10-CM | POA: Insufficient documentation

## 2013-06-14 DIAGNOSIS — E876 Hypokalemia: Secondary | ICD-10-CM | POA: Diagnosis not present

## 2013-06-14 DIAGNOSIS — R229 Localized swelling, mass and lump, unspecified: Secondary | ICD-10-CM | POA: Diagnosis not present

## 2013-06-14 DIAGNOSIS — H9190 Unspecified hearing loss, unspecified ear: Secondary | ICD-10-CM | POA: Insufficient documentation

## 2013-06-14 DIAGNOSIS — Y9210 Unspecified residential institution as the place of occurrence of the external cause: Secondary | ICD-10-CM | POA: Insufficient documentation

## 2013-06-14 DIAGNOSIS — F411 Generalized anxiety disorder: Secondary | ICD-10-CM | POA: Diagnosis not present

## 2013-06-14 DIAGNOSIS — Z87448 Personal history of other diseases of urinary system: Secondary | ICD-10-CM | POA: Insufficient documentation

## 2013-06-14 DIAGNOSIS — Z8669 Personal history of other diseases of the nervous system and sense organs: Secondary | ICD-10-CM | POA: Diagnosis not present

## 2013-06-14 DIAGNOSIS — Z79899 Other long term (current) drug therapy: Secondary | ICD-10-CM | POA: Insufficient documentation

## 2013-06-14 DIAGNOSIS — Z8719 Personal history of other diseases of the digestive system: Secondary | ICD-10-CM | POA: Insufficient documentation

## 2013-06-14 DIAGNOSIS — W1809XA Striking against other object with subsequent fall, initial encounter: Secondary | ICD-10-CM | POA: Insufficient documentation

## 2013-06-14 DIAGNOSIS — M79609 Pain in unspecified limb: Secondary | ICD-10-CM | POA: Diagnosis not present

## 2013-06-14 DIAGNOSIS — G40909 Epilepsy, unspecified, not intractable, without status epilepticus: Secondary | ICD-10-CM | POA: Diagnosis not present

## 2013-06-14 DIAGNOSIS — W010XXA Fall on same level from slipping, tripping and stumbling without subsequent striking against object, initial encounter: Secondary | ICD-10-CM | POA: Insufficient documentation

## 2013-06-14 LAB — BASIC METABOLIC PANEL
BUN: 15 mg/dL (ref 6–23)
Calcium: 9.3 mg/dL (ref 8.4–10.5)
GFR calc non Af Amer: 35 mL/min — ABNORMAL LOW (ref >90)
Glucose, Bld: 103 mg/dL — ABNORMAL HIGH (ref 70–99)

## 2013-06-14 LAB — URINALYSIS, ROUTINE W REFLEX MICROSCOPIC
Bilirubin Urine: NEGATIVE
Hgb urine dipstick: NEGATIVE
Protein, ur: NEGATIVE mg/dL
Specific Gravity, Urine: 1.012 (ref 1.005–1.030)
Urobilinogen, UA: 0.2 mg/dL (ref 0.0–1.0)

## 2013-06-14 LAB — CBC WITH DIFFERENTIAL/PLATELET
Eosinophils Absolute: 0.2 10*3/uL (ref 0.0–0.7)
Eosinophils Relative: 2 % (ref 0–5)
Hemoglobin: 11.6 g/dL — ABNORMAL LOW (ref 12.0–15.0)
Lymphs Abs: 2 10*3/uL (ref 0.7–4.0)
MCH: 28.5 pg (ref 26.0–34.0)
MCV: 87.5 fL (ref 78.0–100.0)
Monocytes Absolute: 0.8 10*3/uL (ref 0.1–1.0)
Monocytes Relative: 13 % — ABNORMAL HIGH (ref 3–12)
RBC: 4.07 MIL/uL (ref 3.87–5.11)

## 2013-06-14 MED ORDER — POTASSIUM CHLORIDE CRYS ER 20 MEQ PO TBCR
40.0000 meq | EXTENDED_RELEASE_TABLET | Freq: Once | ORAL | Status: AC
  Administered 2013-06-14: 40 meq via ORAL
  Filled 2013-06-14: qty 2

## 2013-06-14 MED ORDER — POTASSIUM CHLORIDE CRYS ER 20 MEQ PO TBCR: 20.0000 meq | EXTENDED_RELEASE_TABLET | Freq: Every day | ORAL | Status: DC

## 2013-06-14 NOTE — ED Notes (Signed)
Pt arrived to Ed from [facility] via EMS.  Pt got up to go to the bathroom and stated to EMS that she was going to fast, lost her balance and hit her head on a shelf.  Pt and Nursing staff deny any LOC.  Pt is alert and orientated.  Pt is not on anti-coagulate medication per the Nursing facilities med list.

## 2013-06-14 NOTE — ED Notes (Signed)
Pt out of ED with [facility] to transport back to facility

## 2013-06-14 NOTE — ED Notes (Signed)
Bed: [id number] Expected date:  Expected time:  Means of arrival:  Comments: EMS 79yo F; fall; knot to back of head

## 2013-06-14 NOTE — ED Notes (Signed)
Called [facility] for transport awaiting arrival.
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
## 2013-06-15 DIAGNOSIS — Q828 Other specified congenital malformations of skin: Secondary | ICD-10-CM | POA: Diagnosis not present

## 2013-06-15 DIAGNOSIS — L608 Other nail disorders: Secondary | ICD-10-CM | POA: Diagnosis not present

## 2013-06-15 DIAGNOSIS — E1159 Type 2 diabetes mellitus with other circulatory complications: Secondary | ICD-10-CM | POA: Diagnosis not present

## 2013-06-21 DIAGNOSIS — E119 Type 2 diabetes mellitus without complications: Secondary | ICD-10-CM | POA: Diagnosis not present

## 2013-06-21 DIAGNOSIS — F0150 Vascular dementia without behavioral disturbance: Secondary | ICD-10-CM | POA: Diagnosis not present

## 2013-06-21 DIAGNOSIS — F329 Major depressive disorder, single episode, unspecified: Secondary | ICD-10-CM | POA: Diagnosis not present

## 2013-08-05 DIAGNOSIS — R609 Edema, unspecified: Secondary | ICD-10-CM | POA: Diagnosis not present

## 2013-08-05 DIAGNOSIS — E119 Type 2 diabetes mellitus without complications: Secondary | ICD-10-CM | POA: Diagnosis not present

## 2013-08-05 DIAGNOSIS — F0390 Unspecified dementia without behavioral disturbance: Secondary | ICD-10-CM | POA: Diagnosis not present

## 2013-08-18 DIAGNOSIS — G4700 Insomnia, unspecified: Secondary | ICD-10-CM | POA: Diagnosis not present

## 2013-08-18 DIAGNOSIS — F0630 Mood disorder due to known physiological condition, unspecified: Secondary | ICD-10-CM | POA: Diagnosis not present

## 2013-08-18 DIAGNOSIS — F0281 Dementia in other diseases classified elsewhere with behavioral disturbance: Secondary | ICD-10-CM | POA: Diagnosis not present

## 2013-09-01 DIAGNOSIS — Z23 Encounter for immunization: Secondary | ICD-10-CM | POA: Diagnosis not present

## 2013-09-06 DIAGNOSIS — B351 Tinea unguium: Secondary | ICD-10-CM | POA: Diagnosis not present

## 2013-09-06 DIAGNOSIS — M79609 Pain in unspecified limb: Secondary | ICD-10-CM | POA: Diagnosis not present

## 2013-09-07 DIAGNOSIS — G4700 Insomnia, unspecified: Secondary | ICD-10-CM | POA: Diagnosis not present

## 2013-09-07 DIAGNOSIS — F0281 Dementia in other diseases classified elsewhere with behavioral disturbance: Secondary | ICD-10-CM | POA: Diagnosis not present

## 2013-09-07 DIAGNOSIS — F0630 Mood disorder due to known physiological condition, unspecified: Secondary | ICD-10-CM | POA: Diagnosis not present

## 2013-09-21 ENCOUNTER — Ambulatory Visit: Payer: Medicare Other | Admitting: Podiatrist

## 2013-09-21 DIAGNOSIS — N183 Chronic kidney disease, stage 3 unspecified: Secondary | ICD-10-CM | POA: Diagnosis not present

## 2013-09-21 DIAGNOSIS — E119 Type 2 diabetes mellitus without complications: Secondary | ICD-10-CM | POA: Diagnosis not present

## 2013-09-21 DIAGNOSIS — I509 Heart failure, unspecified: Secondary | ICD-10-CM | POA: Diagnosis not present

## 2013-09-21 DIAGNOSIS — I10 Essential (primary) hypertension: Secondary | ICD-10-CM | POA: Diagnosis not present

## 2013-10-05 ENCOUNTER — Encounter: Payer: Self-pay | Admitting: Podiatrist

## 2013-10-05 ENCOUNTER — Ambulatory Visit (INDEPENDENT_AMBULATORY_CARE_PROVIDER_SITE_OTHER): Payer: Medicare Other | Admitting: Podiatrist

## 2013-10-05 VITALS — BP 165/91 | HR 68 | Resp 16

## 2013-10-05 DIAGNOSIS — E1159 Type 2 diabetes mellitus with other circulatory complications: Secondary | ICD-10-CM

## 2013-10-05 DIAGNOSIS — Q828 Other specified congenital malformations of skin: Secondary | ICD-10-CM

## 2013-10-05 NOTE — Progress Notes (Signed)
  Subjective: Patient presents today for pre ulcerative callus lesions fourth interspaces bilateral. She states that there is a podiatrist it comes to her assisted-living facility who trimmed her toenails about 1 month ago. She did want to allow him to trim the calluses and she's here for me to do this for her. No change in past medical history, medications or allergies.  Objective: Neurovascular status unchanged. Large hyperkeratotic, pre-ulcerative lesions present fourth interspace bilateral feet. They're painful and symptomatic.  Assessment: Diabetes with porokeratotic lesions fourth interspace bilateral  Plan: Debrided the lesion with a #15 blade without complication. She'll be seen back in 2-3 months for continued care.

## 2013-10-05 NOTE — Patient Instructions (Signed)
Diabetes and Foot Care Diabetes may cause you to have problems because of poor blood supply (circulation) to your feet and legs. This may cause the skin on your feet to become thinner, break easier, and heal more slowly. Your skin may become dry, and the skin may peel and crack. You may also have nerve damage in your legs and feet causing decreased feeling in them. You may not notice minor injuries to your feet that could lead to infections or more serious problems. Taking care of your feet is one of the most important things you can do for yourself.  HOME CARE INSTRUCTIONS  Wear shoes at all times, even in the house. Do not go barefoot. Bare feet are easily injured.  Check your feet daily for blisters, cuts, and redness. If you cannot see the bottom of your feet, use a mirror or ask someone for help.  Wash your feet with warm water (do not use hot water) and mild soap. Then pat your feet and the areas between your toes until they are completely dry. Do not soak your feet as this can dry your skin.  Apply a moisturizing lotion or petroleum jelly (that does not contain alcohol and is unscented) to the skin on your feet and to dry, brittle toenails. Do not apply lotion between your toes.  Trim your toenails straight across. Do not dig under them or around the cuticle. File the edges of your nails with an emery board or nail file.  Do not cut corns or calluses or try to remove them with medicine.  Wear clean socks or stockings every day. Make sure they are not too tight. Do not wear knee-high stockings since they may decrease blood flow to your legs.  Wear shoes that fit properly and have enough cushioning. To break in new shoes, wear them for just a few hours a day. This prevents you from injuring your feet. Always look in your shoes before you put them on to be sure there are no objects inside.  Do not cross your legs. This may decrease the blood flow to your feet.  If you find a minor scrape,  cut, or break in the skin on your feet, keep it and the skin around it clean and dry. These areas may be cleansed with mild soap and water. Do not cleanse the area with peroxide, alcohol, or iodine.  When you remove an adhesive bandage, be sure not to damage the skin around it.  If you have a wound, look at it several times a day to make sure it is healing.  Do not use heating pads or hot water bottles. They may burn your skin. If you have lost feeling in your feet or legs, you may not know it is happening until it is too late.  Make sure your health care provider performs a complete foot exam at least annually or more often if you have foot problems. Report any cuts, sores, or bruises to your health care provider immediately. SEEK MEDICAL CARE IF:   You have an injury that is not healing.  You have cuts or breaks in the skin.  You have an ingrown nail.  You notice redness on your legs or feet.  You feel burning or tingling in your legs or feet.  You have pain or cramps in your legs and feet.  Your legs or feet are numb.  Your feet always feel cold. SEEK IMMEDIATE MEDICAL CARE IF:   There is increasing redness,   swelling, or pain in or around a wound.  There is a red line that goes up your leg.  Pus is coming from a wound.  You develop a fever or as directed by your health care provider.  You notice a bad smell coming from an ulcer or wound. Document Released: 10/10/2000 Document Revised: 06/15/2013 Document Reviewed: 03/22/2013 [employer] Patient Information 2014 [employer].  

## 2014-01-06 ENCOUNTER — Ambulatory Visit (INDEPENDENT_AMBULATORY_CARE_PROVIDER_SITE_OTHER): Payer: Medicare Other | Admitting: Podiatrist

## 2014-01-06 ENCOUNTER — Encounter: Payer: Self-pay | Admitting: Podiatrist

## 2014-01-06 VITALS — BP 121/57 | HR 63 | Resp 18

## 2014-01-06 DIAGNOSIS — B351 Tinea unguium: Secondary | ICD-10-CM

## 2014-01-06 DIAGNOSIS — M79609 Pain in unspecified limb: Secondary | ICD-10-CM

## 2014-01-06 DIAGNOSIS — E1159 Type 2 diabetes mellitus with other circulatory complications: Secondary | ICD-10-CM

## 2014-01-06 NOTE — Progress Notes (Signed)
I just need a trim on my feet  HPI:  Patient presents today for follow up of foot and nail care. Denies any new complaints today.  Objective:  Patients chart is reviewed.  Vascular status reveals pedal pulses noted at 1 out of 4 dp and 0/4  pt bilateral .  Neurological sensation is unchanged.  Patients nails are thickened, discolored, distrophic, friable and brittle with yellow-brown discoloration. Patient subjectively relates they are painful with shoes and with ambulation of bilateral feet. Hyperkeratotic lesions of 4th interspaces bilateral are not bothersome at today's visit.   Assessment:  Symptomatic onychomycosis, diabetes with circulatory compromise  Plan:  Discussed treatment options and alternatives.  The symptomatic toenails were debrided through manual an mechanical means without complication.  Return appointment recommended at routine intervals of 3 months    Trudie Buckler, DPM

## 2014-01-06 NOTE — Patient Instructions (Signed)
Diabetes and Foot Care Diabetes may cause you to have problems because of poor blood supply (circulation) to your feet and legs. This may cause the skin on your feet to become thinner, break easier, and heal more slowly. Your skin may become dry, and the skin may peel and crack. You may also have nerve damage in your legs and feet causing decreased feeling in them. You may not notice minor injuries to your feet that could lead to infections or more serious problems. Taking care of your feet is one of the most important things you can do for yourself.  HOME CARE INSTRUCTIONS  Wear shoes at all times, even in the house. Do not go barefoot. Bare feet are easily injured.  Check your feet daily for blisters, cuts, and redness. If you cannot see the bottom of your feet, use a mirror or ask someone for help.  Wash your feet with warm water (do not use hot water) and mild soap. Then pat your feet and the areas between your toes until they are completely dry. Do not soak your feet as this can dry your skin.  Apply a moisturizing lotion or petroleum jelly (that does not contain alcohol and is unscented) to the skin on your feet and to dry, brittle toenails. Do not apply lotion between your toes.  Trim your toenails straight across. Do not dig under them or around the cuticle. File the edges of your nails with an emery board or nail file.  Do not cut corns or calluses or try to remove them with medicine.  Wear clean socks or stockings every day. Make sure they are not too tight. Do not wear knee-high stockings since they may decrease blood flow to your legs.  Wear shoes that fit properly and have enough cushioning. To break in new shoes, wear them for just a few hours a day. This prevents you from injuring your feet. Always look in your shoes before you put them on to be sure there are no objects inside.  Do not cross your legs. This may decrease the blood flow to your feet.  If you find a minor scrape,  cut, or break in the skin on your feet, keep it and the skin around it clean and dry. These areas may be cleansed with mild soap and water. Do not cleanse the area with peroxide, alcohol, or iodine.  When you remove an adhesive bandage, be sure not to damage the skin around it.  If you have a wound, look at it several times a day to make sure it is healing.  Do not use heating pads or hot water bottles. They may burn your skin. If you have lost feeling in your feet or legs, you may not know it is happening until it is too late.  Make sure your health care provider performs a complete foot exam at least annually or more often if you have foot problems. Report any cuts, sores, or bruises to your health care provider immediately. SEEK MEDICAL CARE IF:   You have an injury that is not healing.  You have cuts or breaks in the skin.  You have an ingrown nail.  You notice redness on your legs or feet.  You feel burning or tingling in your legs or feet.  You have pain or cramps in your legs and feet.  Your legs or feet are numb.  Your feet always feel cold. SEEK IMMEDIATE MEDICAL CARE IF:   There is increasing redness,   swelling, or pain in or around a wound.  There is a red line that goes up your leg.  Pus is coming from a wound.  You develop a fever or as directed by your health care provider.  You notice a bad smell coming from an ulcer or wound. Document Released: 10/10/2000 Document Revised: 06/15/2013 Document Reviewed: 03/22/2013 [employer] Patient Information 2014 [employer].  

## 2014-01-31 DIAGNOSIS — F0390 Unspecified dementia without behavioral disturbance: Secondary | ICD-10-CM | POA: Diagnosis not present

## 2014-01-31 DIAGNOSIS — Z23 Encounter for immunization: Secondary | ICD-10-CM | POA: Diagnosis not present

## 2014-01-31 DIAGNOSIS — E119 Type 2 diabetes mellitus without complications: Secondary | ICD-10-CM | POA: Diagnosis not present

## 2014-01-31 DIAGNOSIS — N183 Chronic kidney disease, stage 3 unspecified: Secondary | ICD-10-CM | POA: Diagnosis not present

## 2014-01-31 DIAGNOSIS — Z0000 Encounter for general adult medical examination without abnormal findings: Secondary | ICD-10-CM | POA: Diagnosis not present

## 2014-01-31 DIAGNOSIS — Z1331 Encounter for screening for depression: Secondary | ICD-10-CM | POA: Diagnosis not present

## 2014-03-08 DIAGNOSIS — M79609 Pain in unspecified limb: Secondary | ICD-10-CM | POA: Diagnosis not present

## 2014-03-08 DIAGNOSIS — B351 Tinea unguium: Secondary | ICD-10-CM | POA: Diagnosis not present

## 2014-04-14 ENCOUNTER — Ambulatory Visit: Payer: Medicare Other | Admitting: Podiatrist

## 2014-04-18 DIAGNOSIS — F0390 Unspecified dementia without behavioral disturbance: Secondary | ICD-10-CM | POA: Diagnosis not present

## 2014-04-18 DIAGNOSIS — N760 Acute vaginitis: Secondary | ICD-10-CM | POA: Diagnosis not present

## 2014-05-08 ENCOUNTER — Encounter: Payer: Self-pay | Admitting: Diagnostic Neuroimaging

## 2014-05-08 ENCOUNTER — Ambulatory Visit (INDEPENDENT_AMBULATORY_CARE_PROVIDER_SITE_OTHER): Payer: Medicare Other | Admitting: Diagnostic Neuroimaging

## 2014-05-08 ENCOUNTER — Encounter (INDEPENDENT_AMBULATORY_CARE_PROVIDER_SITE_OTHER): Payer: Self-pay

## 2014-05-08 VITALS — BP 143/73 | HR 63 | Temp 97.8°F | Ht 63.0 in | Wt 173.6 lb

## 2014-05-08 DIAGNOSIS — F0390 Unspecified dementia without behavioral disturbance: Secondary | ICD-10-CM | POA: Insufficient documentation

## 2014-05-08 DIAGNOSIS — F03B18 Unspecified dementia, moderate, with other behavioral disturbance: Secondary | ICD-10-CM

## 2014-05-08 DIAGNOSIS — F0391 Unspecified dementia with behavioral disturbance: Secondary | ICD-10-CM

## 2014-05-08 DIAGNOSIS — F03918 Unspecified dementia, unspecified severity, with other behavioral disturbance: Secondary | ICD-10-CM | POA: Diagnosis not present

## 2014-05-08 DIAGNOSIS — F03B0 Unspecified dementia, moderate, without behavioral disturbance, psychotic disturbance, mood disturbance, and anxiety: Secondary | ICD-10-CM | POA: Insufficient documentation

## 2014-05-08 NOTE — Patient Instructions (Signed)
Continue current medications. 
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
## 2014-05-12 ENCOUNTER — Ambulatory Visit (INDEPENDENT_AMBULATORY_CARE_PROVIDER_SITE_OTHER): Payer: Medicare Other | Admitting: Podiatrist

## 2014-05-12 ENCOUNTER — Encounter: Payer: Self-pay | Admitting: Podiatrist

## 2014-05-12 DIAGNOSIS — B351 Tinea unguium: Secondary | ICD-10-CM

## 2014-05-12 DIAGNOSIS — M79609 Pain in unspecified limb: Secondary | ICD-10-CM

## 2014-05-12 DIAGNOSIS — M79676 Pain in unspecified toe(s): Principal | ICD-10-CM

## 2014-05-12 NOTE — Progress Notes (Signed)
HPI: Patient presents today for follow up of foot and nail care. Denies any new complaints today.   Objective: Patients chart is reviewed. Vascular status reveals pedal pulses noted at 1 out of 4 dp and 0/4 pt bilateral . Neurological sensation is unchanged. Patients nails are thickened, discolored, distrophic, friable and brittle with yellow-brown discoloration. Patient subjectively relates they are painful with shoes and with ambulation of bilateral feet. Hyperkeratotic lesions of 4th interspaces bilateral are not bothersome at today's visit.   Assessment: Symptomatic onychomycosis, diabetes with circulatory compromise   Plan: Discussed treatment options and alternatives. The symptomatic toenails were debrided through manual an mechanical means without complication. Return appointment recommended at routine intervals of 3 months.  Will check the calluses next time to see if a debridement is necessary.

## 2014-06-14 DIAGNOSIS — F259 Schizoaffective disorder, unspecified: Secondary | ICD-10-CM | POA: Diagnosis not present

## 2014-08-04 DIAGNOSIS — F329 Major depressive disorder, single episode, unspecified: Secondary | ICD-10-CM | POA: Diagnosis not present

## 2014-08-04 DIAGNOSIS — E119 Type 2 diabetes mellitus without complications: Secondary | ICD-10-CM | POA: Diagnosis not present

## 2014-08-04 DIAGNOSIS — M109 Gout, unspecified: Secondary | ICD-10-CM | POA: Diagnosis not present

## 2014-08-04 DIAGNOSIS — F0390 Unspecified dementia without behavioral disturbance: Secondary | ICD-10-CM | POA: Diagnosis not present

## 2014-08-04 DIAGNOSIS — I5030 Unspecified diastolic (congestive) heart failure: Secondary | ICD-10-CM | POA: Diagnosis not present

## 2014-08-04 DIAGNOSIS — E780 Pure hypercholesterolemia: Secondary | ICD-10-CM | POA: Diagnosis not present

## 2014-08-04 DIAGNOSIS — N183 Chronic kidney disease, stage 3 (moderate): Secondary | ICD-10-CM | POA: Diagnosis not present

## 2014-08-04 DIAGNOSIS — Z23 Encounter for immunization: Secondary | ICD-10-CM | POA: Diagnosis not present

## 2014-08-11 ENCOUNTER — Ambulatory Visit: Payer: Medicare Other | Admitting: Podiatrist

## 2014-08-23 ENCOUNTER — Encounter: Payer: Self-pay | Admitting: Podiatrist

## 2014-08-23 ENCOUNTER — Ambulatory Visit (INDEPENDENT_AMBULATORY_CARE_PROVIDER_SITE_OTHER): Payer: Medicare Other | Admitting: Podiatrist

## 2014-08-23 DIAGNOSIS — B351 Tinea unguium: Secondary | ICD-10-CM | POA: Diagnosis not present

## 2014-08-23 DIAGNOSIS — M79676 Pain in unspecified toe(s): Secondary | ICD-10-CM

## 2014-08-23 MED ORDER — TAVABOROLE 5 % EX SOLN: 1.0000 [drp] | CUTANEOUS | Status: DC

## 2014-08-23 NOTE — Patient Instructions (Addendum)
Instructions for topical antifungal-- apply daily to the affected toenails for 6-9 months or until the toenail grows out fully  Nail Fungus  A fungal infection of the nail (tinea unguium/onychomycosis) is common. It is common as the visible part of the nail is composed of dead cells which have no blood supply to help prevent infection. It occurs because fungi are everywhere and will pick any opportunity to grow on any dead material. Because nails are very slow growing they require up to 2 years of treatment with anti-fungal medications. The entire nail back to the base is infected. This includes approximately  of the nail which you cannot see. If your caregiver has prescribed a medication by mouth, take it every day and as directed. No progress will be seen for at least 6 to 9 months. Do not be disappointed! Because fungi live on dead cells with little or no exposure to blood supply, medication delivery to the infection is slow; thus the cure is slow. It is also why you can observe no progress in the first 6 months. The nail becoming cured is the base of the nail, as it has the blood supply. Topical medication such as creams and ointments are usually not effective. Important in successful treatment of nail fungus is closely following the medication regimen that your doctor prescribes. Sometimes you and your caregiver may elect to speed up this process by surgical removal of all the nails. Even this may still require 6 to 9 months of additional oral medications. See your caregiver as directed. Remember there will be no visible improvement for at least 6 months. See your caregiver sooner if other signs of infection (redness and swelling) develop. Document Released: 10/10/2000 Document Revised: 01/05/2012 Document Reviewed: 12/19/2008 [facility] Patient Information 2015 [place], [place]. This information is not intended to replace advice given to you by your health care provider. Make sure you discuss any  questions you have with your health care provider.

## 2014-08-24 ENCOUNTER — Telehealth: Payer: Self-pay | Admitting: *Deleted

## 2014-08-24 NOTE — Telephone Encounter (Signed)
She got an order for [employer].  It say apply to nails for 6-9 months.  We need it clarified.  Does she want it for 6 or 9 months?  We need something written.  I asked for the fax number.  "Fax number is [phone]."  I told her I would fax the order over.  I faxed the note.

## 2014-08-28 ENCOUNTER — Telehealth: Payer: Self-pay | Admitting: *Deleted

## 2014-08-28 NOTE — Telephone Encounter (Signed)
We are calling to see if it is okay to take an okay for a prescription from patients daughter for the [facility] prescription.  Her name is Tanya Smith.  I told her we do have the patient listed on the patient's phone log.  He stated, "That is all we need to know."

## 2014-08-28 NOTE — Progress Notes (Signed)
HPI: Patient presents today for follow up of foot and nail care. Denies any new complaints today.   Objective: Patients chart is reviewed. Vascular status reveals pedal pulses noted at 1 out of 4 dp and 0/4 pt bilateral . Neurological sensation is unchanged. Patients nails are thickened, discolored, distrophic, friable and brittle with yellow-brown discoloration. Patient subjectively relates they are painful with shoes and with ambulation of bilateral feet. Hyperkeratotic lesions of 4th interspaces bilateral are not bothersome at today's visit.   Assessment: Symptomatic onychomycosis, diabetes with circulatory compromise   Plan: Discussed treatment options and alternatives. The symptomatic toenails were debrided through manual an mechanical means without complication. Discussed treatment of the toenails with Kerydin.  She will apply to the nails for 6-9 months as she watches the toenails grow out.

## 2014-09-13 DIAGNOSIS — F251 Schizoaffective disorder, depressive type: Secondary | ICD-10-CM | POA: Diagnosis not present

## 2014-09-18 ENCOUNTER — Ambulatory Visit: Payer: Medicare Other | Admitting: Podiatry

## 2014-09-20 ENCOUNTER — Encounter: Payer: Self-pay | Admitting: Podiatry

## 2014-09-20 ENCOUNTER — Ambulatory Visit (INDEPENDENT_AMBULATORY_CARE_PROVIDER_SITE_OTHER): Payer: Medicare Other | Admitting: Podiatry

## 2014-09-20 VITALS — BP 149/77 | HR 72 | Resp 12

## 2014-09-20 DIAGNOSIS — L84 Corns and callosities: Secondary | ICD-10-CM | POA: Diagnosis not present

## 2014-09-20 DIAGNOSIS — Z8679 Personal history of other diseases of the circulatory system: Secondary | ICD-10-CM

## 2014-09-20 NOTE — Patient Instructions (Signed)
Cut shoe out over fifth right toe

## 2014-09-21 NOTE — Progress Notes (Signed)
Patient ID: Tanya Smith, female   DOB: 1930-01-31, 80 y.o.   MRN: [id number]  Subjective: This patient presents with her daughter complaining of a fourth right webspace keratoses. On the visit of 09/07/2014 Dr. reaction debrided mycotic toenails without debridement of this lesion  Objective: Fourth right webspace has well-organized hyperkeratotic lesion without any erythema, edema, drainage, warmth or malodor  Assessment: Fourth right webspace hyperkeratoses/corn without a clinical sign of infection History of peripheral arterial disease  Plan: Debrided keratoses without any bleeding Apply protective pad around the painful keratoses  Reappoint at next scheduled visit with Dr. Marvel Plan

## 2014-11-02 DIAGNOSIS — R300 Dysuria: Secondary | ICD-10-CM | POA: Diagnosis not present

## 2014-11-24 ENCOUNTER — Ambulatory Visit: Payer: Medicare Other | Admitting: Podiatrist

## 2014-12-01 ENCOUNTER — Ambulatory Visit (INDEPENDENT_AMBULATORY_CARE_PROVIDER_SITE_OTHER): Payer: Medicare Other | Admitting: Podiatrist

## 2014-12-01 ENCOUNTER — Encounter: Payer: Self-pay | Admitting: Podiatrist

## 2014-12-01 DIAGNOSIS — B351 Tinea unguium: Secondary | ICD-10-CM

## 2014-12-01 DIAGNOSIS — L84 Corns and callosities: Secondary | ICD-10-CM

## 2014-12-01 DIAGNOSIS — M79676 Pain in unspecified toe(s): Secondary | ICD-10-CM

## 2014-12-01 DIAGNOSIS — R609 Edema, unspecified: Secondary | ICD-10-CM

## 2014-12-01 DIAGNOSIS — Z8679 Personal history of other diseases of the circulatory system: Secondary | ICD-10-CM | POA: Diagnosis not present

## 2014-12-01 MED ORDER — TAVABOROLE 5 % EX SOLN: 1.0000 [drp] | CUTANEOUS | Status: DC

## 2014-12-01 NOTE — Patient Instructions (Signed)
DIABETIC SHOES-  Diabetic shoes and accomidative inserts are indicated for those persons who have Diabetes and who are at an increased for a foot ulceration.  This requires patients to have decreased feeling in the feet, circulation problems, as well as an abnormal foot structure, or a combination of these disorders.  As podiatrists, we can recommend a diabetic shoe to your primary care doctor or endocrinologist HOWEVER, it is up to them to decide if you are eligible or in need of these shoes.  Before we can take measurements or do the ordering of a diabetic shoe and accomidative insert, it is required that we get a signed authorization from your physician both confirming you have Diabetes and that you are at risk for an ulceration.  Please note, if you physician will not sign for the shoes, there is no way we can go around him or her.  You are welcome to place an order for the shoes, however the financial responsibility is up to you and your insurance cannot be billed.   Once, approval is granted for the diabetic shoes and inserts we will make you a separate appointment to be measured, casted, and for you to choose the diabetic shoe.  We will then call you when they are ready for dispensing and you will be seen to make sure the shoes and inserts fit your foot properly.  As this is a multi-step process, it generally takes 4-8 weeks from start to finish.  You cooperation is appreciated.  If it has been more than 6 weeks from the date you ordered your diabetic shoe and you have not heard from our office, please give us a call.     

## 2014-12-05 NOTE — Progress Notes (Signed)
HPI: Patient presents today for follow up of preventative diabetic foot and nail care. Denies any sign of infection. She does have swelling per her baseline.  She has a painful lesion between the 3rd and fourth toes which is painful.  Toenails also symptomatic and mycotic x 10.  Objective: Patients chart is reviewed. Vascular status reveals pedal pulses noted at 1 out of 4 dp and 0/4 pt bilateral . Neurological sensation is unchanged with neuropathy noted bilateral at swmf at 2/5 sites bilateral. Patients nails are thickened, discolored, distrophic, friable and brittle with yellow-brown discoloration. Patient subjectively relates they are painful with shoes and with ambulation of bilateral feet. Hyperkeratotic lesions of 4th interspaces bilateral are present and bothersome at today's visit.   Assessment: Symptomatic onychomycosis, diabetes with circulatory compromise/ neuropathy, ipk lesion 4th is bilateral   Plan: Discussed treatment options and alternatives. The symptomatic toenails and hpk lesions  were debrided through manual an mechanical means without complication. Discussed positive benefit of diabetic shoes to accomidate the swelling and allow for wide forefoot to take the pressure off the interspaces to prevent the ipk's from returning.  We will fill out the paperwork and request the shoes through her primary care physician.

## 2014-12-13 DIAGNOSIS — F251 Schizoaffective disorder, depressive type: Secondary | ICD-10-CM | POA: Diagnosis not present

## 2015-01-09 ENCOUNTER — Telehealth: Payer: Self-pay | Admitting: Podiatrist

## 2015-01-09 NOTE — Telephone Encounter (Signed)
PT DAUGHTER HAS COME IN IS HAS SUPPORTING POA PAPERWORK FOR HER MOTHER. SHE STATES THAT [facility] HAS BEEN CALLING BUT WE WOULD NOT RELEASE INFORMATION ABOUT PATIENT DUE TO NOT HAVING POA PAPERWORK. I HAVE COPIED PAPERWORK UNDER THE DOCUMENT SECTION OF CHART SO IT IS THERE FOR WHOEVER NEEDS IT. DAUGHTER WOULD LIKE TO CHECK ON RX REFILL FOR [place] AND ALSO FOR THE DIABETIC SHOES PROCESS.

## 2015-01-10 NOTE — Telephone Encounter (Signed)
I called [facility].  I informed them that patient's Power of Attorney called and stated you all would not speak to her in regards to her mother's Kerydin prescription.  "We left her a message on 12/25/2014 to call us back and the haven't called. We sent a letter to them as well.  Would you like [place] to try and contact them again?"  Yes, that will be great.

## 2015-01-16 DIAGNOSIS — I5030 Unspecified diastolic (congestive) heart failure: Secondary | ICD-10-CM | POA: Diagnosis not present

## 2015-01-16 DIAGNOSIS — M109 Gout, unspecified: Secondary | ICD-10-CM | POA: Diagnosis not present

## 2015-01-16 DIAGNOSIS — F0390 Unspecified dementia without behavioral disturbance: Secondary | ICD-10-CM | POA: Diagnosis not present

## 2015-01-16 DIAGNOSIS — E119 Type 2 diabetes mellitus without complications: Secondary | ICD-10-CM | POA: Diagnosis not present

## 2015-01-16 DIAGNOSIS — N183 Chronic kidney disease, stage 3 (moderate): Secondary | ICD-10-CM | POA: Diagnosis not present

## 2015-01-16 DIAGNOSIS — K5902 Outlet dysfunction constipation: Secondary | ICD-10-CM | POA: Diagnosis not present

## 2015-01-16 DIAGNOSIS — F329 Major depressive disorder, single episode, unspecified: Secondary | ICD-10-CM | POA: Diagnosis not present

## 2015-01-18 ENCOUNTER — Ambulatory Visit: Payer: Medicare Other | Admitting: *Deleted

## 2015-01-18 DIAGNOSIS — R609 Edema, unspecified: Secondary | ICD-10-CM

## 2015-01-18 NOTE — Progress Notes (Signed)
Patient ID: Tanya Smith, female   DOB: 05-Dec-1929, 81 y.o.   MRN: [id number] MEASURED AND SCANNED PT FOR DIABETIC SHOES  ORTHO FEET [place] SPANDEX WOMEN'S 9.5 WIDE

## 2015-02-02 ENCOUNTER — Ambulatory Visit (INDEPENDENT_AMBULATORY_CARE_PROVIDER_SITE_OTHER): Payer: Medicare Other | Admitting: Podiatrist

## 2015-02-02 DIAGNOSIS — M79676 Pain in unspecified toe(s): Secondary | ICD-10-CM

## 2015-02-02 DIAGNOSIS — B351 Tinea unguium: Secondary | ICD-10-CM | POA: Diagnosis not present

## 2015-02-02 DIAGNOSIS — L84 Corns and callosities: Secondary | ICD-10-CM

## 2015-02-02 NOTE — Progress Notes (Signed)
HPI: Patient presents today for follow up of preventative diabetic foot and nail care. Denies any sign of infection. She does have swelling per her baseline.  She has a painful lesion between the fourth and fifth toes of the right foot which is painful.  Toenails also symptomatic and mycotic x 10. She presents today with her daughter who is upset about her 30 minute wait time today.   Objective: Patients chart is reviewed. Vascular status reveals pedal pulses noted at 1 out of 4 dp and 0/4 pt bilateral . Neurological sensation is unchanged with neuropathy noted bilateral at swmf at 2/5 sites bilateral. Patients nails are thickened, discolored, distrophic, friable and brittle with yellow-brown discoloration. Patient subjectively relates they are painful with shoes and with ambulation of bilateral feet. Hyperkeratotic lesions of 4th interspace right is present and bothersome at today's visit.   Assessment: Symptomatic onychomycosis, diabetes with circulatory compromise/ neuropathy, ipk lesion 4th is right.    Plan: Discussed treatment options and alternatives. The symptomatic toenails and hpk lesion was debrided through manual an mechanical means without complication. We are still awaiting the diabetic shoes and will call when these are ready for pick up. She will be seen back for routine diabetic foot care in 3 months or as needed.

## 2015-03-16 DIAGNOSIS — F251 Schizoaffective disorder, depressive type: Secondary | ICD-10-CM | POA: Diagnosis not present

## 2015-03-20 ENCOUNTER — Ambulatory Visit (INDEPENDENT_AMBULATORY_CARE_PROVIDER_SITE_OTHER): Payer: Medicare Other | Admitting: Podiatry

## 2015-03-20 DIAGNOSIS — E1159 Type 2 diabetes mellitus with other circulatory complications: Secondary | ICD-10-CM | POA: Diagnosis not present

## 2015-03-20 DIAGNOSIS — I70203 Unspecified atherosclerosis of native arteries of extremities, bilateral legs: Secondary | ICD-10-CM

## 2015-03-20 DIAGNOSIS — L84 Corns and callosities: Secondary | ICD-10-CM

## 2015-03-20 DIAGNOSIS — E1151 Type 2 diabetes mellitus with diabetic peripheral angiopathy without gangrene: Secondary | ICD-10-CM

## 2015-03-20 DIAGNOSIS — Z8679 Personal history of other diseases of the circulatory system: Secondary | ICD-10-CM

## 2015-03-20 NOTE — Progress Notes (Signed)
Patient ID: Tanya Smith, female   DOB: Dec 31, 1929, 81 y.o.   MRN: [id number] Patient presents for diabetic shoe pick up, shoes are tried on for good fit.  Patient received 1 pair [place] in a women's size 9.5 wide with 3 pairs custom diabetic inserts.  Written break in and wear instructions given.

## 2015-03-20 NOTE — Patient Instructions (Signed)

## 2015-04-10 DIAGNOSIS — E119 Type 2 diabetes mellitus without complications: Secondary | ICD-10-CM | POA: Diagnosis not present

## 2015-04-10 DIAGNOSIS — K5902 Outlet dysfunction constipation: Secondary | ICD-10-CM | POA: Diagnosis not present

## 2015-04-10 DIAGNOSIS — I5030 Unspecified diastolic (congestive) heart failure: Secondary | ICD-10-CM | POA: Diagnosis not present

## 2015-04-10 DIAGNOSIS — F325 Major depressive disorder, single episode, in full remission: Secondary | ICD-10-CM | POA: Diagnosis not present

## 2015-04-10 DIAGNOSIS — F0390 Unspecified dementia without behavioral disturbance: Secondary | ICD-10-CM | POA: Diagnosis not present

## 2015-04-12 DIAGNOSIS — F251 Schizoaffective disorder, depressive type: Secondary | ICD-10-CM | POA: Diagnosis not present

## 2015-05-14 ENCOUNTER — Ambulatory Visit: Payer: Medicare Other | Admitting: Podiatry

## 2015-06-27 ENCOUNTER — Ambulatory Visit: Payer: Medicare Other | Admitting: Podiatry

## 2015-07-11 ENCOUNTER — Other Ambulatory Visit: Payer: Self-pay | Admitting: Internal Medicine

## 2015-07-11 DIAGNOSIS — I5030 Unspecified diastolic (congestive) heart failure: Secondary | ICD-10-CM | POA: Diagnosis not present

## 2015-07-11 DIAGNOSIS — Z1231 Encounter for screening mammogram for malignant neoplasm of breast: Secondary | ICD-10-CM

## 2015-07-11 DIAGNOSIS — Z1389 Encounter for screening for other disorder: Secondary | ICD-10-CM | POA: Diagnosis not present

## 2015-07-11 DIAGNOSIS — Z0000 Encounter for general adult medical examination without abnormal findings: Secondary | ICD-10-CM | POA: Diagnosis not present

## 2015-07-11 DIAGNOSIS — E780 Pure hypercholesterolemia: Secondary | ICD-10-CM | POA: Diagnosis not present

## 2015-07-11 DIAGNOSIS — M109 Gout, unspecified: Secondary | ICD-10-CM | POA: Diagnosis not present

## 2015-07-11 DIAGNOSIS — F325 Major depressive disorder, single episode, in full remission: Secondary | ICD-10-CM | POA: Diagnosis not present

## 2015-07-11 DIAGNOSIS — Z23 Encounter for immunization: Secondary | ICD-10-CM | POA: Diagnosis not present

## 2015-07-11 DIAGNOSIS — N183 Chronic kidney disease, stage 3 (moderate): Secondary | ICD-10-CM | POA: Diagnosis not present

## 2015-07-11 DIAGNOSIS — E119 Type 2 diabetes mellitus without complications: Secondary | ICD-10-CM | POA: Diagnosis not present

## 2015-07-11 DIAGNOSIS — F0390 Unspecified dementia without behavioral disturbance: Secondary | ICD-10-CM | POA: Diagnosis not present

## 2015-07-11 DIAGNOSIS — E669 Obesity, unspecified: Secondary | ICD-10-CM | POA: Diagnosis not present

## 2015-07-11 DIAGNOSIS — Z6832 Body mass index (BMI) 32.0-32.9, adult: Secondary | ICD-10-CM | POA: Diagnosis not present

## 2015-07-16 DIAGNOSIS — F251 Schizoaffective disorder, depressive type: Secondary | ICD-10-CM | POA: Diagnosis not present

## 2015-07-18 ENCOUNTER — Ambulatory Visit
Admission: RE | Admit: 2015-07-18 | Discharge: 2015-07-18 | Disposition: A | Discharge status: Home / Self Care | Payer: Medicare Other | Source: Ambulatory Visit | Attending: Internal Medicine | Admitting: Internal Medicine

## 2015-07-18 DIAGNOSIS — Z1231 Encounter for screening mammogram for malignant neoplasm of breast: Secondary | ICD-10-CM

## 2015-07-25 NOTE — Patient Outreach (Signed)
[facility]) Care Management  07/25/2015  Tanya Smith 11/05/1929 [id number]   Referral from [employer] Tier 2 List, assigned Johny Shock, RN to outreach for [place] Management services.  Thanks, Ronnell Freshwater. [place], [place] Assistant Phone: [phone] Fax: [phone]

## 2015-07-27 ENCOUNTER — Other Ambulatory Visit: Payer: Self-pay | Admitting: *Deleted

## 2015-07-27 NOTE — Patient Outreach (Signed)
[facility]) Care Management  07/27/2015  [name] January 03, 1930 [id number]   RN Health Coach attempted outreach call to Tier 2 patient to discuss services of [place]. Patient was unavailable. HIPPA compliance voicemail message was left with return callback number.   [place] Care Management [phone]

## 2015-07-31 ENCOUNTER — Other Ambulatory Visit: Payer: Self-pay | Admitting: *Deleted

## 2015-07-31 NOTE — Patient Outreach (Signed)
[facility] [facility]) Care Management  07/31/2015  POLIXENI BROWE [birth year] [id number]   [place]  2nd attempted outreach call to Tier 2 patient to discuss services of [place]. Patient was unavailable. HIPPA compliance voicemail message was left with return callback number.   [facility] Care Management [phone]

## 2015-08-23 DIAGNOSIS — L989 Disorder of the skin and subcutaneous tissue, unspecified: Secondary | ICD-10-CM | POA: Diagnosis not present

## 2015-09-14 ENCOUNTER — Other Ambulatory Visit: Payer: Self-pay | Admitting: *Deleted

## 2015-10-01 DIAGNOSIS — I5030 Unspecified diastolic (congestive) heart failure: Secondary | ICD-10-CM | POA: Diagnosis not present

## 2015-10-01 DIAGNOSIS — K5902 Outlet dysfunction constipation: Secondary | ICD-10-CM | POA: Diagnosis not present

## 2015-10-01 DIAGNOSIS — E119 Type 2 diabetes mellitus without complications: Secondary | ICD-10-CM | POA: Diagnosis not present

## 2015-10-01 DIAGNOSIS — F0390 Unspecified dementia without behavioral disturbance: Secondary | ICD-10-CM | POA: Diagnosis not present

## 2015-10-01 DIAGNOSIS — F329 Major depressive disorder, single episode, unspecified: Secondary | ICD-10-CM | POA: Diagnosis not present

## 2015-10-01 DIAGNOSIS — F251 Schizoaffective disorder, depressive type: Secondary | ICD-10-CM | POA: Diagnosis not present

## 2015-11-17 DIAGNOSIS — R4782 Fluency disorder in conditions classified elsewhere: Secondary | ICD-10-CM | POA: Diagnosis not present

## 2015-11-17 DIAGNOSIS — R278 Other lack of coordination: Secondary | ICD-10-CM | POA: Diagnosis not present

## 2015-11-17 DIAGNOSIS — R2689 Other abnormalities of gait and mobility: Secondary | ICD-10-CM | POA: Diagnosis not present

## 2015-11-17 DIAGNOSIS — R2681 Unsteadiness on feet: Secondary | ICD-10-CM | POA: Diagnosis not present

## 2015-11-17 DIAGNOSIS — M6281 Muscle weakness (generalized): Secondary | ICD-10-CM | POA: Diagnosis not present

## 2015-11-17 DIAGNOSIS — F802 Mixed receptive-expressive language disorder: Secondary | ICD-10-CM | POA: Diagnosis not present

## 2015-11-19 DIAGNOSIS — R278 Other lack of coordination: Secondary | ICD-10-CM | POA: Diagnosis not present

## 2015-11-19 DIAGNOSIS — M6281 Muscle weakness (generalized): Secondary | ICD-10-CM | POA: Diagnosis not present

## 2015-11-19 DIAGNOSIS — R2681 Unsteadiness on feet: Secondary | ICD-10-CM | POA: Diagnosis not present

## 2015-11-19 DIAGNOSIS — R2689 Other abnormalities of gait and mobility: Secondary | ICD-10-CM | POA: Diagnosis not present

## 2015-11-19 DIAGNOSIS — R4782 Fluency disorder in conditions classified elsewhere: Secondary | ICD-10-CM | POA: Diagnosis not present

## 2015-11-19 DIAGNOSIS — F802 Mixed receptive-expressive language disorder: Secondary | ICD-10-CM | POA: Diagnosis not present

## 2015-11-21 DIAGNOSIS — R2681 Unsteadiness on feet: Secondary | ICD-10-CM | POA: Diagnosis not present

## 2015-11-21 DIAGNOSIS — R2689 Other abnormalities of gait and mobility: Secondary | ICD-10-CM | POA: Diagnosis not present

## 2015-11-21 DIAGNOSIS — F802 Mixed receptive-expressive language disorder: Secondary | ICD-10-CM | POA: Diagnosis not present

## 2015-11-21 DIAGNOSIS — R4782 Fluency disorder in conditions classified elsewhere: Secondary | ICD-10-CM | POA: Diagnosis not present

## 2015-11-21 DIAGNOSIS — R278 Other lack of coordination: Secondary | ICD-10-CM | POA: Diagnosis not present

## 2015-11-21 DIAGNOSIS — M6281 Muscle weakness (generalized): Secondary | ICD-10-CM | POA: Diagnosis not present

## 2015-11-22 DIAGNOSIS — R2689 Other abnormalities of gait and mobility: Secondary | ICD-10-CM | POA: Diagnosis not present

## 2015-11-22 DIAGNOSIS — M6281 Muscle weakness (generalized): Secondary | ICD-10-CM | POA: Diagnosis not present

## 2015-11-22 DIAGNOSIS — F802 Mixed receptive-expressive language disorder: Secondary | ICD-10-CM | POA: Diagnosis not present

## 2015-11-22 DIAGNOSIS — R278 Other lack of coordination: Secondary | ICD-10-CM | POA: Diagnosis not present

## 2015-11-22 DIAGNOSIS — R4782 Fluency disorder in conditions classified elsewhere: Secondary | ICD-10-CM | POA: Diagnosis not present

## 2015-11-22 DIAGNOSIS — R2681 Unsteadiness on feet: Secondary | ICD-10-CM | POA: Diagnosis not present

## 2015-11-26 DIAGNOSIS — R278 Other lack of coordination: Secondary | ICD-10-CM | POA: Diagnosis not present

## 2015-11-26 DIAGNOSIS — R2681 Unsteadiness on feet: Secondary | ICD-10-CM | POA: Diagnosis not present

## 2015-11-26 DIAGNOSIS — M6281 Muscle weakness (generalized): Secondary | ICD-10-CM | POA: Diagnosis not present

## 2015-11-26 DIAGNOSIS — R4782 Fluency disorder in conditions classified elsewhere: Secondary | ICD-10-CM | POA: Diagnosis not present

## 2015-11-26 DIAGNOSIS — R2689 Other abnormalities of gait and mobility: Secondary | ICD-10-CM | POA: Diagnosis not present

## 2015-11-26 DIAGNOSIS — F802 Mixed receptive-expressive language disorder: Secondary | ICD-10-CM | POA: Diagnosis not present

## 2015-11-27 DIAGNOSIS — M6281 Muscle weakness (generalized): Secondary | ICD-10-CM | POA: Diagnosis not present

## 2015-11-27 DIAGNOSIS — R278 Other lack of coordination: Secondary | ICD-10-CM | POA: Diagnosis not present

## 2015-11-27 DIAGNOSIS — R2689 Other abnormalities of gait and mobility: Secondary | ICD-10-CM | POA: Diagnosis not present

## 2015-11-27 DIAGNOSIS — F802 Mixed receptive-expressive language disorder: Secondary | ICD-10-CM | POA: Diagnosis not present

## 2015-11-27 DIAGNOSIS — R2681 Unsteadiness on feet: Secondary | ICD-10-CM | POA: Diagnosis not present

## 2015-11-27 DIAGNOSIS — R4782 Fluency disorder in conditions classified elsewhere: Secondary | ICD-10-CM | POA: Diagnosis not present

## 2015-11-28 DIAGNOSIS — R4782 Fluency disorder in conditions classified elsewhere: Secondary | ICD-10-CM | POA: Diagnosis not present

## 2015-11-28 DIAGNOSIS — R2681 Unsteadiness on feet: Secondary | ICD-10-CM | POA: Diagnosis not present

## 2015-11-28 DIAGNOSIS — M6281 Muscle weakness (generalized): Secondary | ICD-10-CM | POA: Diagnosis not present

## 2015-11-28 DIAGNOSIS — F802 Mixed receptive-expressive language disorder: Secondary | ICD-10-CM | POA: Diagnosis not present

## 2015-11-28 DIAGNOSIS — R278 Other lack of coordination: Secondary | ICD-10-CM | POA: Diagnosis not present

## 2015-11-29 DIAGNOSIS — F802 Mixed receptive-expressive language disorder: Secondary | ICD-10-CM | POA: Diagnosis not present

## 2015-11-29 DIAGNOSIS — R4782 Fluency disorder in conditions classified elsewhere: Secondary | ICD-10-CM | POA: Diagnosis not present

## 2015-11-29 DIAGNOSIS — M6281 Muscle weakness (generalized): Secondary | ICD-10-CM | POA: Diagnosis not present

## 2015-11-29 DIAGNOSIS — R2681 Unsteadiness on feet: Secondary | ICD-10-CM | POA: Diagnosis not present

## 2015-11-29 DIAGNOSIS — R278 Other lack of coordination: Secondary | ICD-10-CM | POA: Diagnosis not present

## 2015-12-03 DIAGNOSIS — R2681 Unsteadiness on feet: Secondary | ICD-10-CM | POA: Diagnosis not present

## 2015-12-03 DIAGNOSIS — M6281 Muscle weakness (generalized): Secondary | ICD-10-CM | POA: Diagnosis not present

## 2015-12-03 DIAGNOSIS — R4782 Fluency disorder in conditions classified elsewhere: Secondary | ICD-10-CM | POA: Diagnosis not present

## 2015-12-03 DIAGNOSIS — F802 Mixed receptive-expressive language disorder: Secondary | ICD-10-CM | POA: Diagnosis not present

## 2015-12-03 DIAGNOSIS — R278 Other lack of coordination: Secondary | ICD-10-CM | POA: Diagnosis not present

## 2015-12-04 DIAGNOSIS — M6281 Muscle weakness (generalized): Secondary | ICD-10-CM | POA: Diagnosis not present

## 2015-12-04 DIAGNOSIS — R278 Other lack of coordination: Secondary | ICD-10-CM | POA: Diagnosis not present

## 2015-12-04 DIAGNOSIS — F802 Mixed receptive-expressive language disorder: Secondary | ICD-10-CM | POA: Diagnosis not present

## 2015-12-04 DIAGNOSIS — R4782 Fluency disorder in conditions classified elsewhere: Secondary | ICD-10-CM | POA: Diagnosis not present

## 2015-12-04 DIAGNOSIS — R2681 Unsteadiness on feet: Secondary | ICD-10-CM | POA: Diagnosis not present

## 2015-12-05 DIAGNOSIS — R2681 Unsteadiness on feet: Secondary | ICD-10-CM | POA: Diagnosis not present

## 2015-12-05 DIAGNOSIS — M6281 Muscle weakness (generalized): Secondary | ICD-10-CM | POA: Diagnosis not present

## 2015-12-05 DIAGNOSIS — R278 Other lack of coordination: Secondary | ICD-10-CM | POA: Diagnosis not present

## 2015-12-05 DIAGNOSIS — R4782 Fluency disorder in conditions classified elsewhere: Secondary | ICD-10-CM | POA: Diagnosis not present

## 2015-12-05 DIAGNOSIS — F802 Mixed receptive-expressive language disorder: Secondary | ICD-10-CM | POA: Diagnosis not present

## 2015-12-06 DIAGNOSIS — F802 Mixed receptive-expressive language disorder: Secondary | ICD-10-CM | POA: Diagnosis not present

## 2015-12-06 DIAGNOSIS — R4782 Fluency disorder in conditions classified elsewhere: Secondary | ICD-10-CM | POA: Diagnosis not present

## 2015-12-06 DIAGNOSIS — R278 Other lack of coordination: Secondary | ICD-10-CM | POA: Diagnosis not present

## 2015-12-06 DIAGNOSIS — R2681 Unsteadiness on feet: Secondary | ICD-10-CM | POA: Diagnosis not present

## 2015-12-06 DIAGNOSIS — M6281 Muscle weakness (generalized): Secondary | ICD-10-CM | POA: Diagnosis not present

## 2015-12-07 DIAGNOSIS — F802 Mixed receptive-expressive language disorder: Secondary | ICD-10-CM | POA: Diagnosis not present

## 2015-12-07 DIAGNOSIS — R278 Other lack of coordination: Secondary | ICD-10-CM | POA: Diagnosis not present

## 2015-12-07 DIAGNOSIS — M6281 Muscle weakness (generalized): Secondary | ICD-10-CM | POA: Diagnosis not present

## 2015-12-07 DIAGNOSIS — R4782 Fluency disorder in conditions classified elsewhere: Secondary | ICD-10-CM | POA: Diagnosis not present

## 2015-12-07 DIAGNOSIS — R2681 Unsteadiness on feet: Secondary | ICD-10-CM | POA: Diagnosis not present

## 2015-12-10 DIAGNOSIS — R2681 Unsteadiness on feet: Secondary | ICD-10-CM | POA: Diagnosis not present

## 2015-12-10 DIAGNOSIS — M6281 Muscle weakness (generalized): Secondary | ICD-10-CM | POA: Diagnosis not present

## 2015-12-10 DIAGNOSIS — F802 Mixed receptive-expressive language disorder: Secondary | ICD-10-CM | POA: Diagnosis not present

## 2015-12-10 DIAGNOSIS — R4782 Fluency disorder in conditions classified elsewhere: Secondary | ICD-10-CM | POA: Diagnosis not present

## 2015-12-10 DIAGNOSIS — R278 Other lack of coordination: Secondary | ICD-10-CM | POA: Diagnosis not present

## 2015-12-11 DIAGNOSIS — R4782 Fluency disorder in conditions classified elsewhere: Secondary | ICD-10-CM | POA: Diagnosis not present

## 2015-12-11 DIAGNOSIS — R278 Other lack of coordination: Secondary | ICD-10-CM | POA: Diagnosis not present

## 2015-12-11 DIAGNOSIS — M6281 Muscle weakness (generalized): Secondary | ICD-10-CM | POA: Diagnosis not present

## 2015-12-11 DIAGNOSIS — F802 Mixed receptive-expressive language disorder: Secondary | ICD-10-CM | POA: Diagnosis not present

## 2015-12-11 DIAGNOSIS — R2681 Unsteadiness on feet: Secondary | ICD-10-CM | POA: Diagnosis not present

## 2015-12-12 DIAGNOSIS — M6281 Muscle weakness (generalized): Secondary | ICD-10-CM | POA: Diagnosis not present

## 2015-12-12 DIAGNOSIS — R2681 Unsteadiness on feet: Secondary | ICD-10-CM | POA: Diagnosis not present

## 2015-12-12 DIAGNOSIS — R4782 Fluency disorder in conditions classified elsewhere: Secondary | ICD-10-CM | POA: Diagnosis not present

## 2015-12-12 DIAGNOSIS — R278 Other lack of coordination: Secondary | ICD-10-CM | POA: Diagnosis not present

## 2015-12-12 DIAGNOSIS — F802 Mixed receptive-expressive language disorder: Secondary | ICD-10-CM | POA: Diagnosis not present

## 2015-12-15 DIAGNOSIS — M6281 Muscle weakness (generalized): Secondary | ICD-10-CM | POA: Diagnosis not present

## 2015-12-15 DIAGNOSIS — R4782 Fluency disorder in conditions classified elsewhere: Secondary | ICD-10-CM | POA: Diagnosis not present

## 2015-12-15 DIAGNOSIS — F802 Mixed receptive-expressive language disorder: Secondary | ICD-10-CM | POA: Diagnosis not present

## 2015-12-15 DIAGNOSIS — R278 Other lack of coordination: Secondary | ICD-10-CM | POA: Diagnosis not present

## 2015-12-15 DIAGNOSIS — R2681 Unsteadiness on feet: Secondary | ICD-10-CM | POA: Diagnosis not present

## 2015-12-17 DIAGNOSIS — R2681 Unsteadiness on feet: Secondary | ICD-10-CM | POA: Diagnosis not present

## 2015-12-17 DIAGNOSIS — R4782 Fluency disorder in conditions classified elsewhere: Secondary | ICD-10-CM | POA: Diagnosis not present

## 2015-12-17 DIAGNOSIS — M6281 Muscle weakness (generalized): Secondary | ICD-10-CM | POA: Diagnosis not present

## 2015-12-17 DIAGNOSIS — R278 Other lack of coordination: Secondary | ICD-10-CM | POA: Diagnosis not present

## 2015-12-17 DIAGNOSIS — F802 Mixed receptive-expressive language disorder: Secondary | ICD-10-CM | POA: Diagnosis not present

## 2015-12-18 DIAGNOSIS — R4782 Fluency disorder in conditions classified elsewhere: Secondary | ICD-10-CM | POA: Diagnosis not present

## 2015-12-18 DIAGNOSIS — M6281 Muscle weakness (generalized): Secondary | ICD-10-CM | POA: Diagnosis not present

## 2015-12-18 DIAGNOSIS — F802 Mixed receptive-expressive language disorder: Secondary | ICD-10-CM | POA: Diagnosis not present

## 2015-12-18 DIAGNOSIS — R2681 Unsteadiness on feet: Secondary | ICD-10-CM | POA: Diagnosis not present

## 2015-12-18 DIAGNOSIS — R278 Other lack of coordination: Secondary | ICD-10-CM | POA: Diagnosis not present

## 2015-12-19 DIAGNOSIS — R2681 Unsteadiness on feet: Secondary | ICD-10-CM | POA: Diagnosis not present

## 2015-12-19 DIAGNOSIS — R278 Other lack of coordination: Secondary | ICD-10-CM | POA: Diagnosis not present

## 2015-12-19 DIAGNOSIS — R4782 Fluency disorder in conditions classified elsewhere: Secondary | ICD-10-CM | POA: Diagnosis not present

## 2015-12-19 DIAGNOSIS — M6281 Muscle weakness (generalized): Secondary | ICD-10-CM | POA: Diagnosis not present

## 2015-12-19 DIAGNOSIS — F802 Mixed receptive-expressive language disorder: Secondary | ICD-10-CM | POA: Diagnosis not present

## 2015-12-25 DIAGNOSIS — M6281 Muscle weakness (generalized): Secondary | ICD-10-CM | POA: Diagnosis not present

## 2015-12-25 DIAGNOSIS — R4782 Fluency disorder in conditions classified elsewhere: Secondary | ICD-10-CM | POA: Diagnosis not present

## 2015-12-25 DIAGNOSIS — F802 Mixed receptive-expressive language disorder: Secondary | ICD-10-CM | POA: Diagnosis not present

## 2015-12-25 DIAGNOSIS — R2681 Unsteadiness on feet: Secondary | ICD-10-CM | POA: Diagnosis not present

## 2015-12-25 DIAGNOSIS — R278 Other lack of coordination: Secondary | ICD-10-CM | POA: Diagnosis not present

## 2015-12-26 DIAGNOSIS — R2681 Unsteadiness on feet: Secondary | ICD-10-CM | POA: Diagnosis not present

## 2015-12-26 DIAGNOSIS — M6281 Muscle weakness (generalized): Secondary | ICD-10-CM | POA: Diagnosis not present

## 2015-12-27 DIAGNOSIS — M6281 Muscle weakness (generalized): Secondary | ICD-10-CM | POA: Diagnosis not present

## 2015-12-27 DIAGNOSIS — R2681 Unsteadiness on feet: Secondary | ICD-10-CM | POA: Diagnosis not present

## 2015-12-31 DIAGNOSIS — M6281 Muscle weakness (generalized): Secondary | ICD-10-CM | POA: Diagnosis not present

## 2015-12-31 DIAGNOSIS — M79674 Pain in right toe(s): Secondary | ICD-10-CM | POA: Diagnosis not present

## 2015-12-31 DIAGNOSIS — M79675 Pain in left toe(s): Secondary | ICD-10-CM | POA: Diagnosis not present

## 2015-12-31 DIAGNOSIS — R2681 Unsteadiness on feet: Secondary | ICD-10-CM | POA: Diagnosis not present

## 2015-12-31 DIAGNOSIS — B351 Tinea unguium: Secondary | ICD-10-CM | POA: Diagnosis not present

## 2016-01-01 DIAGNOSIS — R2681 Unsteadiness on feet: Secondary | ICD-10-CM | POA: Diagnosis not present

## 2016-01-01 DIAGNOSIS — M6281 Muscle weakness (generalized): Secondary | ICD-10-CM | POA: Diagnosis not present

## 2016-01-02 DIAGNOSIS — R2681 Unsteadiness on feet: Secondary | ICD-10-CM | POA: Diagnosis not present

## 2016-01-02 DIAGNOSIS — M6281 Muscle weakness (generalized): Secondary | ICD-10-CM | POA: Diagnosis not present

## 2016-01-09 DIAGNOSIS — R2681 Unsteadiness on feet: Secondary | ICD-10-CM | POA: Diagnosis not present

## 2016-01-09 DIAGNOSIS — M6281 Muscle weakness (generalized): Secondary | ICD-10-CM | POA: Diagnosis not present

## 2016-01-16 DIAGNOSIS — N762 Acute vulvitis: Secondary | ICD-10-CM | POA: Diagnosis not present

## 2016-06-24 DIAGNOSIS — B351 Tinea unguium: Secondary | ICD-10-CM | POA: Diagnosis not present

## 2016-06-24 DIAGNOSIS — M79674 Pain in right toe(s): Secondary | ICD-10-CM | POA: Diagnosis not present

## 2016-06-24 DIAGNOSIS — M79675 Pain in left toe(s): Secondary | ICD-10-CM | POA: Diagnosis not present

## 2016-07-15 DIAGNOSIS — N3000 Acute cystitis without hematuria: Secondary | ICD-10-CM | POA: Diagnosis not present

## 2016-07-15 DIAGNOSIS — R309 Painful micturition, unspecified: Secondary | ICD-10-CM | POA: Diagnosis not present

## 2016-07-24 DIAGNOSIS — Z23 Encounter for immunization: Secondary | ICD-10-CM | POA: Diagnosis not present

## 2016-07-24 DIAGNOSIS — E7800 Pure hypercholesterolemia, unspecified: Secondary | ICD-10-CM | POA: Diagnosis not present

## 2016-07-24 DIAGNOSIS — F482 Pseudobulbar affect: Secondary | ICD-10-CM | POA: Diagnosis not present

## 2016-07-24 DIAGNOSIS — N3000 Acute cystitis without hematuria: Secondary | ICD-10-CM | POA: Diagnosis not present

## 2016-07-24 DIAGNOSIS — E119 Type 2 diabetes mellitus without complications: Secondary | ICD-10-CM | POA: Diagnosis not present

## 2016-07-24 DIAGNOSIS — F0390 Unspecified dementia without behavioral disturbance: Secondary | ICD-10-CM | POA: Diagnosis not present

## 2016-07-24 DIAGNOSIS — N183 Chronic kidney disease, stage 3 (moderate): Secondary | ICD-10-CM | POA: Diagnosis not present

## 2016-07-24 DIAGNOSIS — I5030 Unspecified diastolic (congestive) heart failure: Secondary | ICD-10-CM | POA: Diagnosis not present

## 2016-09-24 ENCOUNTER — Emergency Department (HOSPITAL_COMMUNITY): Payer: Medicare Other

## 2016-09-24 ENCOUNTER — Emergency Department (HOSPITAL_COMMUNITY)
Admission: EM | Admit: 2016-09-24 | Discharge: 2016-09-24 | Disposition: A | Discharge status: Home / Self Care | Payer: Medicare Other | Attending: Emergency Medicine | Admitting: Emergency Medicine

## 2016-09-24 DIAGNOSIS — I130 Hypertensive heart and chronic kidney disease with heart failure and stage 1 through stage 4 chronic kidney disease, or unspecified chronic kidney disease: Secondary | ICD-10-CM | POA: Diagnosis not present

## 2016-09-24 DIAGNOSIS — Z91040 Latex allergy status: Secondary | ICD-10-CM | POA: Insufficient documentation

## 2016-09-24 DIAGNOSIS — M5441 Lumbago with sciatica, right side: Secondary | ICD-10-CM

## 2016-09-24 DIAGNOSIS — N189 Chronic kidney disease, unspecified: Secondary | ICD-10-CM | POA: Diagnosis not present

## 2016-09-24 DIAGNOSIS — E1122 Type 2 diabetes mellitus with diabetic chronic kidney disease: Secondary | ICD-10-CM | POA: Insufficient documentation

## 2016-09-24 DIAGNOSIS — M5442 Lumbago with sciatica, left side: Secondary | ICD-10-CM | POA: Diagnosis not present

## 2016-09-24 DIAGNOSIS — Z87891 Personal history of nicotine dependence: Secondary | ICD-10-CM | POA: Insufficient documentation

## 2016-09-24 DIAGNOSIS — M545 Low back pain: Secondary | ICD-10-CM | POA: Diagnosis not present

## 2016-09-24 DIAGNOSIS — M5489 Other dorsalgia: Secondary | ICD-10-CM | POA: Diagnosis not present

## 2016-09-24 DIAGNOSIS — M1611 Unilateral primary osteoarthritis, right hip: Secondary | ICD-10-CM | POA: Diagnosis not present

## 2016-09-24 DIAGNOSIS — M549 Dorsalgia, unspecified: Secondary | ICD-10-CM | POA: Diagnosis present

## 2016-09-24 DIAGNOSIS — I509 Heart failure, unspecified: Secondary | ICD-10-CM | POA: Diagnosis not present

## 2016-09-24 MED ORDER — ACETAMINOPHEN 500 MG PO TABS
500.0000 mg | ORAL_TABLET | Freq: Once | ORAL | Status: AC
  Administered 2016-09-24: 500 mg via ORAL
  Filled 2016-09-24: qty 1

## 2016-09-24 MED ORDER — IBUPROFEN 400 MG PO TABS: 400.0000 mg | ORAL_TABLET | Freq: Three times a day (TID) | ORAL | 0 refills | Status: DC | PRN

## 2016-09-24 MED ORDER — IBUPROFEN 200 MG PO TABS
400.0000 mg | ORAL_TABLET | Freq: Once | ORAL | Status: AC
  Administered 2016-09-24: 400 mg via ORAL
  Filled 2016-09-24: qty 2

## 2016-09-24 NOTE — ED Notes (Addendum)
After assessing pt, pt verbalized not being in pain and reported feeling good. Pt appears relaxed, comfortable, and NAD at this time. Hx of dementia. Will continue to monitor.

## 2016-09-24 NOTE — ED Notes (Signed)
Pt verbalized pain when trying to ambulate.

## 2016-09-24 NOTE — Discharge Instructions (Signed)
Take the Motrin and Tylenol as needed for the pain. Follow-up with your doctor.

## 2016-09-24 NOTE — ED Triage Notes (Signed)
Per EMS, pt is from [facility] with complaints of back pain. Staff reported that pt has chronic back pain and was not ambulating as she usually does and pt was crying. Pt has advanced dementia and was nonverbal with facility staff and EMS prior to arrival.
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
## 2016-11-18 ENCOUNTER — Other Ambulatory Visit: Payer: Self-pay | Admitting: Licensed Clinical Social Worker

## 2016-11-18 ENCOUNTER — Other Ambulatory Visit: Payer: Self-pay | Admitting: *Deleted

## 2016-11-18 NOTE — Patient Outreach (Signed)
Pt's daughter called me today to request assistance in finding a new memory care facility for her mother, Tanya Smith. She currently resides in a facility, which the daughter cannot recall the name. She says it is beside the [place] [employer]. She says she would like to move her because the facility is dirty and has bugs and rodents. The security is not good. She also does NOT want her to go to one of this facility sister buildings which is at [facility] and [place]. She states she wouldn't mind her being moved to the 3rd sister facility in [place] area. She says she has been requesting a bed there for 6 years. Tanya Smith is the daughter that called me. She says she takes care of all concerns medical and the pt's niece, Tanya Smith, takes care of the financial piece. Fannie said Tanya Smith can be reached after 5 pm at [phone]. This pt is not an open case but was referred to be a couple of months ago when we were calling the high risk list and when I found out she was in a facility, I did not pursue opening the case.   I am forwarding this request to the [facility] department for assistance.  Tanya Smith [phone]

## 2016-11-18 NOTE — Patient Outreach (Signed)
[facility]) Care Management  11/18/2016  KHARIZMA LESNICK May 24, 1930 [id number]   Assessment- CSW received new referral on patient. Family desires a list of memory care units that accept Medicaid for patient to go to. Patient is currently at [facility] facility but states that they would love to transfer her to another facility because the facility is dirty and has bugs and rodents. CSW completed outreach call to patients daughter Nelta Numbers but patient's niece Hinton Dyer answered who handles all of patient's finances. CSW introduced self and reason for call. CSW read Hinton Dyer list of Memory Care units in [facility] that accept Medicaid. Family does not wish for patient to go to half of the ones CSW named. Family has went to a few of the agencies to complete a tour and does not feel that patient moving to them will help. However, they are interested in [facility] in [place] and [facility]. CSW will mail complete list to their residence. Family is aware that there are limited amount of facilities that accept Medicaid and have memory care within [facility] so they may have to consider other places nearby. Family appreciative of this information. CSW educated family on the transfer process once they have found a facility they would like her to move to. Hinton Dyer asked if they decided to bring patient home what would be the process to transition her Medicaid back to Full Adult Medicaid. CSW provided this information. Hinton Dyer also had questions about personal care services through [place]. Family feel that PCS or [facility] will not be an option if she were to return home because she needs 24/7 care. Family is not interested in private pay caregiving. CSW provided emotional support and advice during call. CSW provided other resource for their search: [facility], free local advisor. Family appreciative of support and assistance and deny having any other social work concerns.  CSW will not open case but family can contact this CSW at any time if they have any further questions.  Plan-CSW will update [facility] Care Management. CSW will send request to mail out requested resources. CSW will not open case.  Eula Fried, BSW, MSW, [email or web address] Phone: [phone] Fax: [phone]

## 2016-11-19 NOTE — Patient Outreach (Signed)
[facility]) Care Management  11/19/2016  Tanya Smith [birth year] [id number]   Request received from Eula Fried, LCSW to mail patient list of nursing homes.   [name]  [facility] Care Management Assistant

## 2016-12-08 DIAGNOSIS — R410 Disorientation, unspecified: Secondary | ICD-10-CM | POA: Diagnosis not present

## 2016-12-23 DIAGNOSIS — B351 Tinea unguium: Secondary | ICD-10-CM | POA: Diagnosis not present

## 2016-12-23 DIAGNOSIS — M79675 Pain in left toe(s): Secondary | ICD-10-CM | POA: Diagnosis not present

## 2016-12-23 DIAGNOSIS — M79674 Pain in right toe(s): Secondary | ICD-10-CM | POA: Diagnosis not present

## 2017-01-19 IMAGING — CR DG HIP (WITH OR WITHOUT PELVIS) 2-3V*R*
3 series · 3 of 3 positions shown · non-contrast
Comparison: None.

CLINICAL DATA: Back pain, confusion

EXAM:
DG HIP (WITH OR WITHOUT PELVIS) 2-3V RIGHT

[t pelvis ap]
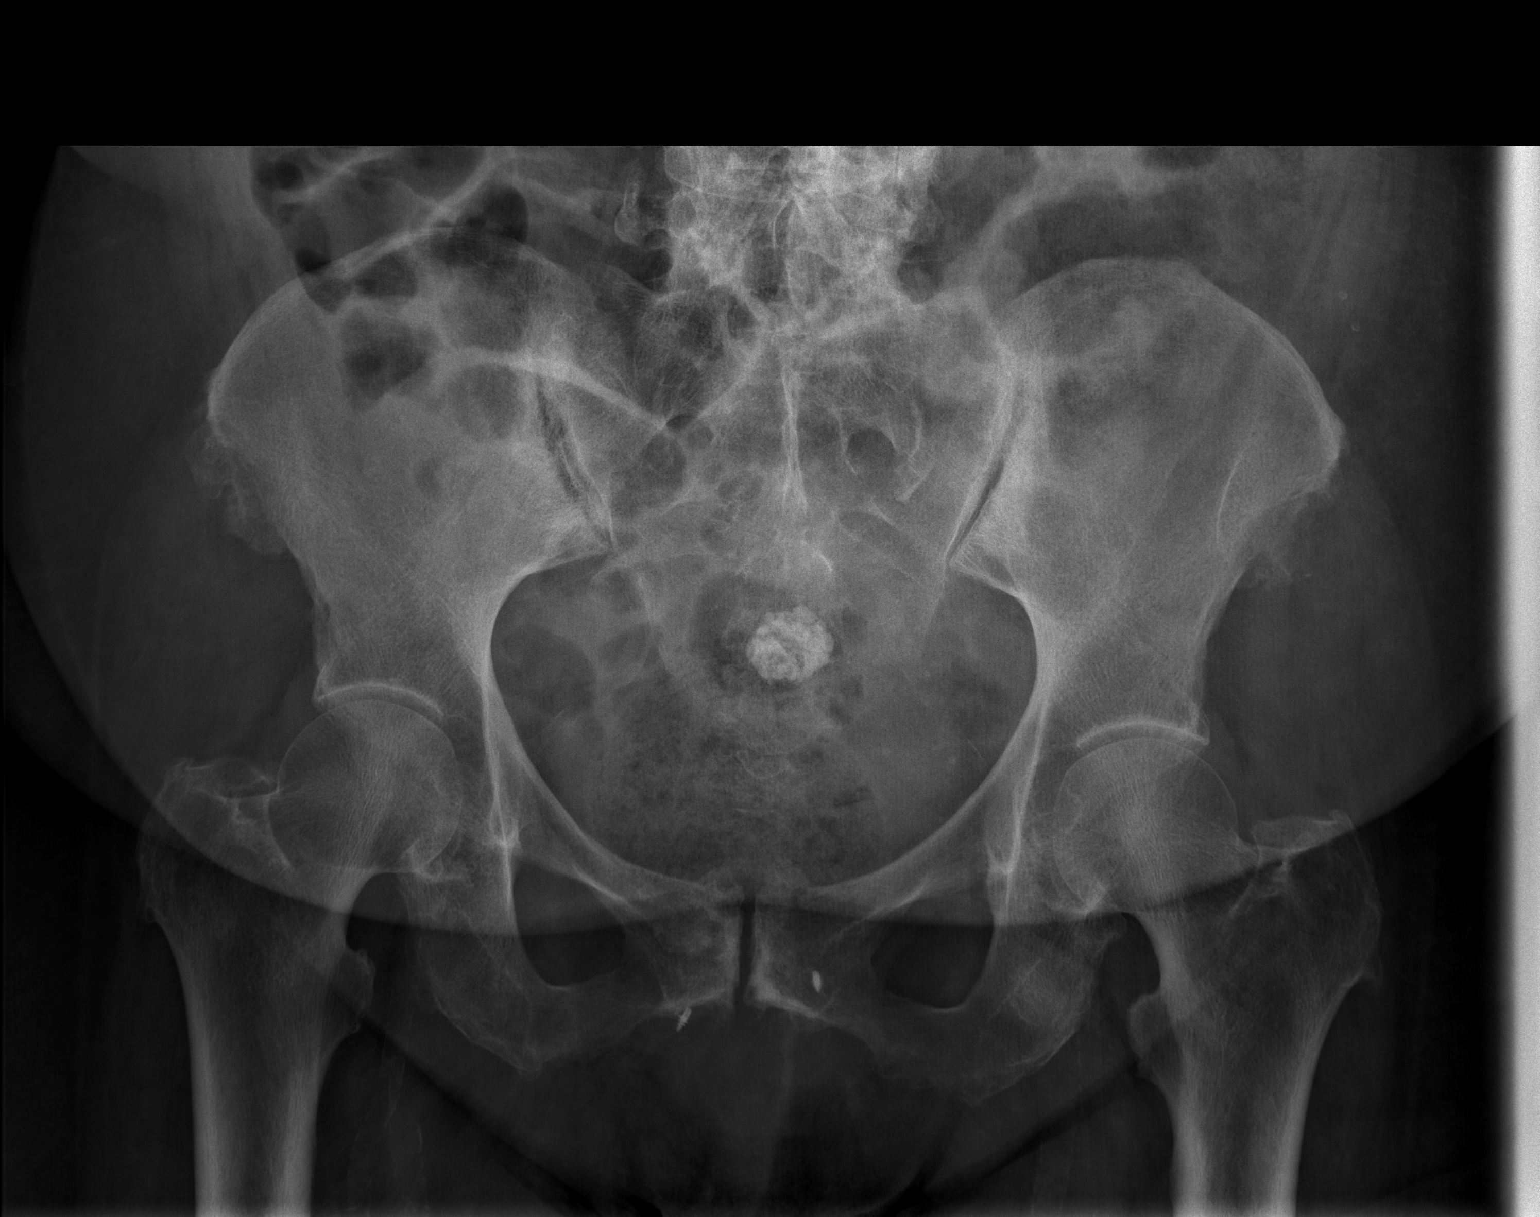

[t hip ap right]
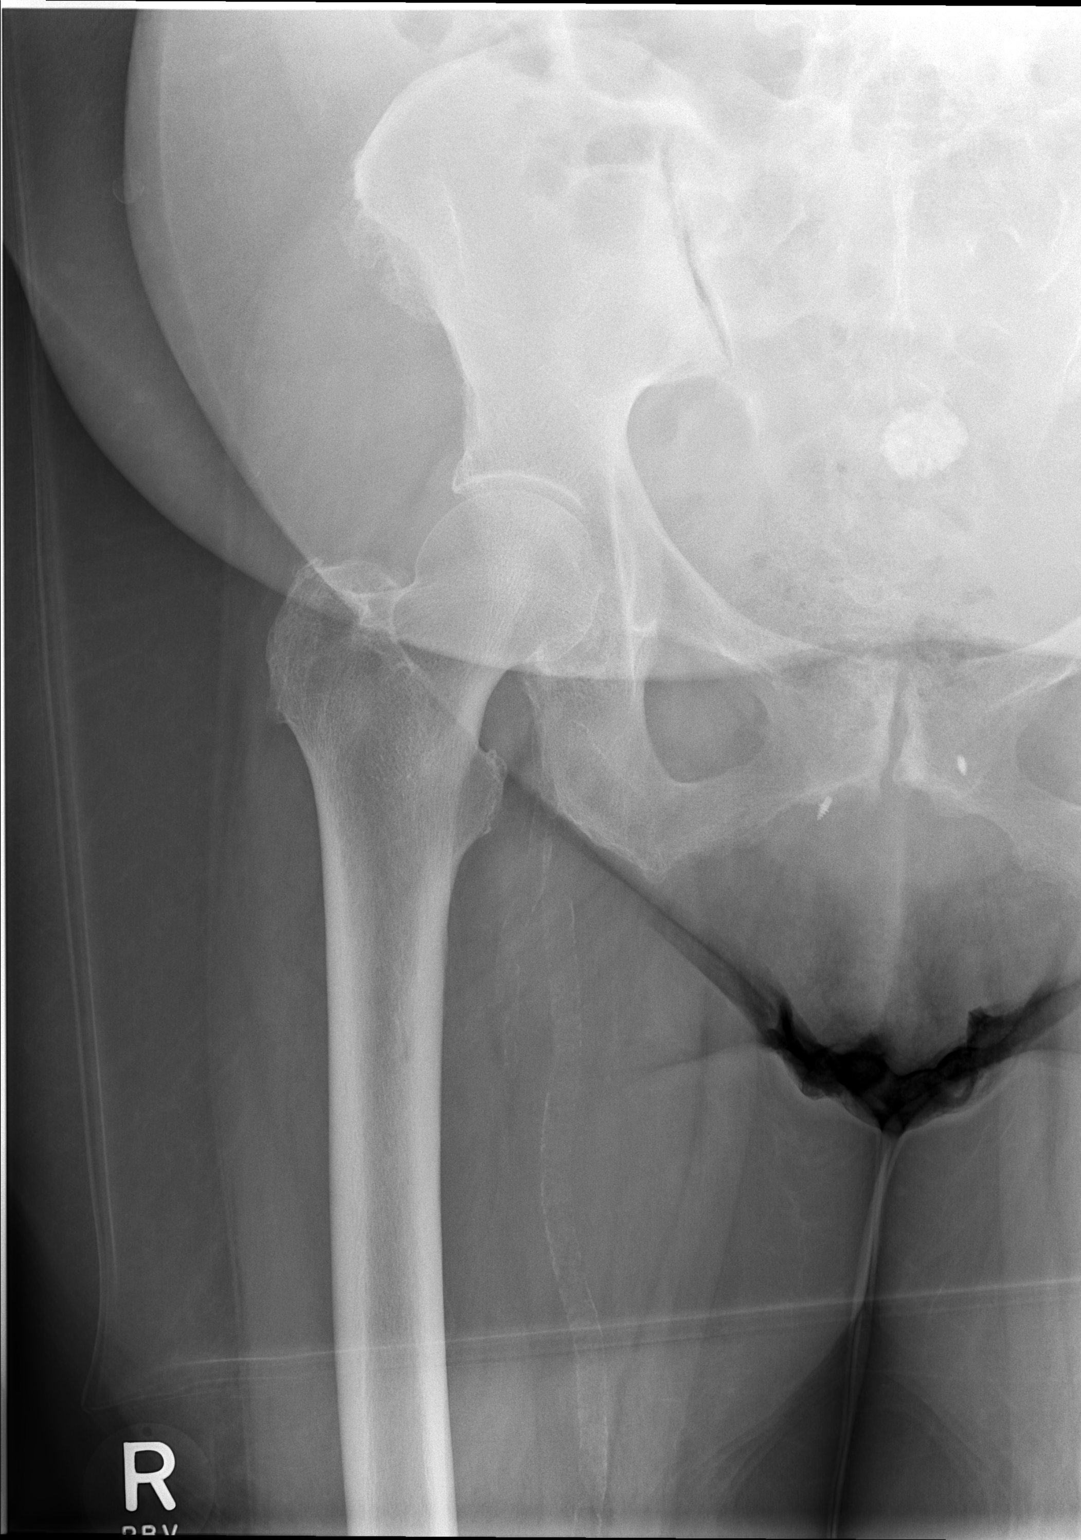

[t hip frog leg right]
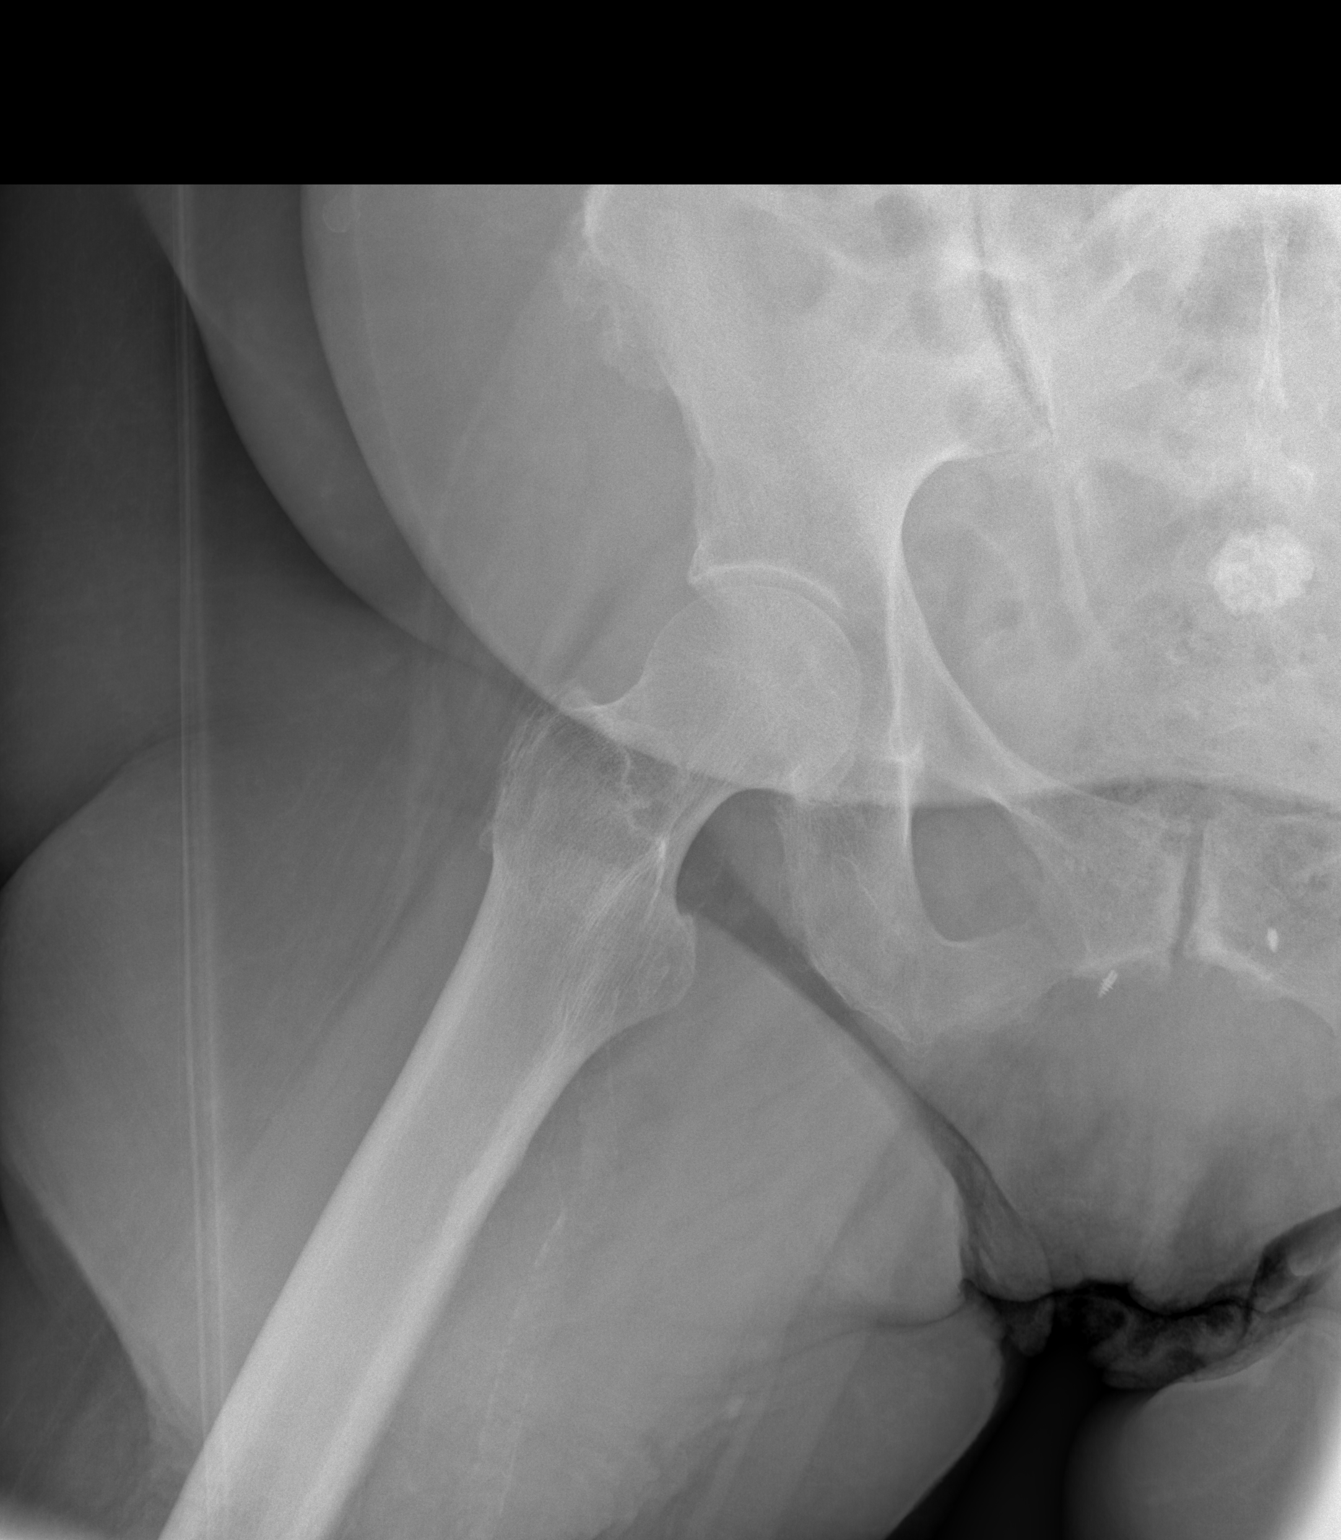

[3 of 3 positions shown; findings below may reference images not displayed]

FINDINGS: There is only mild degenerative joint disease of the hips for age.
The pelvic rami are intact and degenerative change is present at the
pubic symphysis. Degenerative change also is noted within the SI
joints and in the lower lumbar spine. No acute abnormality is seen.
Calcified fibroid is noted in the mid pelvis.
IMPRESSION: Degenerative change diffusely. No acute abnormality. Calcified
fibroid pre

## 2017-01-29 DIAGNOSIS — E7800 Pure hypercholesterolemia, unspecified: Secondary | ICD-10-CM | POA: Diagnosis not present

## 2017-01-29 DIAGNOSIS — I5030 Unspecified diastolic (congestive) heart failure: Secondary | ICD-10-CM | POA: Diagnosis not present

## 2017-01-29 DIAGNOSIS — M109 Gout, unspecified: Secondary | ICD-10-CM | POA: Diagnosis not present

## 2017-01-29 DIAGNOSIS — Z0000 Encounter for general adult medical examination without abnormal findings: Secondary | ICD-10-CM | POA: Diagnosis not present

## 2017-01-29 DIAGNOSIS — N183 Chronic kidney disease, stage 3 (moderate): Secondary | ICD-10-CM | POA: Diagnosis not present

## 2017-01-29 DIAGNOSIS — F0390 Unspecified dementia without behavioral disturbance: Secondary | ICD-10-CM | POA: Diagnosis not present

## 2017-01-29 DIAGNOSIS — E119 Type 2 diabetes mellitus without complications: Secondary | ICD-10-CM | POA: Diagnosis not present

## 2017-01-29 DIAGNOSIS — F329 Major depressive disorder, single episode, unspecified: Secondary | ICD-10-CM | POA: Diagnosis not present

## 2017-01-29 DIAGNOSIS — Z1389 Encounter for screening for other disorder: Secondary | ICD-10-CM | POA: Diagnosis not present

## 2017-02-21 ENCOUNTER — Encounter (HOSPITAL_COMMUNITY): Payer: Self-pay | Admitting: Emergency Medicine

## 2017-02-21 ENCOUNTER — Inpatient Hospital Stay (HOSPITAL_COMMUNITY)
Admission: EM | Admit: 2017-02-21 | Discharge: 2017-02-22 | DRG: 084 | Disposition: A | Discharge status: Home / Self Care | Payer: Medicare Other | Attending: Neurological Surgery | Admitting: Neurological Surgery

## 2017-02-21 ENCOUNTER — Emergency Department (HOSPITAL_COMMUNITY): Payer: Medicare Other

## 2017-02-21 DIAGNOSIS — S4991XA Unspecified injury of right shoulder and upper arm, initial encounter: Secondary | ICD-10-CM | POA: Diagnosis not present

## 2017-02-21 DIAGNOSIS — E119 Type 2 diabetes mellitus without complications: Secondary | ICD-10-CM | POA: Diagnosis not present

## 2017-02-21 DIAGNOSIS — F0390 Unspecified dementia without behavioral disturbance: Secondary | ICD-10-CM | POA: Diagnosis not present

## 2017-02-21 DIAGNOSIS — Z841 Family history of disorders of kidney and ureter: Secondary | ICD-10-CM | POA: Diagnosis not present

## 2017-02-21 DIAGNOSIS — S0091XA Abrasion of unspecified part of head, initial encounter: Secondary | ICD-10-CM | POA: Diagnosis not present

## 2017-02-21 DIAGNOSIS — E7800 Pure hypercholesterolemia, unspecified: Secondary | ICD-10-CM | POA: Diagnosis present

## 2017-02-21 DIAGNOSIS — W1839XA Other fall on same level, initial encounter: Secondary | ICD-10-CM | POA: Diagnosis not present

## 2017-02-21 DIAGNOSIS — S0993XA Unspecified injury of face, initial encounter: Secondary | ICD-10-CM | POA: Diagnosis not present

## 2017-02-21 DIAGNOSIS — Z87891 Personal history of nicotine dependence: Secondary | ICD-10-CM

## 2017-02-21 DIAGNOSIS — Z9109 Other allergy status, other than to drugs and biological substances: Secondary | ICD-10-CM

## 2017-02-21 DIAGNOSIS — S065XAA Traumatic subdural hemorrhage with loss of consciousness status unknown, initial encounter: Secondary | ICD-10-CM | POA: Diagnosis present

## 2017-02-21 DIAGNOSIS — H409 Unspecified glaucoma: Secondary | ICD-10-CM | POA: Diagnosis not present

## 2017-02-21 DIAGNOSIS — Z888 Allergy status to other drugs, medicaments and biological substances status: Secondary | ICD-10-CM

## 2017-02-21 DIAGNOSIS — S098XXA Other specified injuries of head, initial encounter: Secondary | ICD-10-CM | POA: Diagnosis not present

## 2017-02-21 DIAGNOSIS — Z79899 Other long term (current) drug therapy: Secondary | ICD-10-CM

## 2017-02-21 DIAGNOSIS — M109 Gout, unspecified: Secondary | ICD-10-CM | POA: Diagnosis present

## 2017-02-21 DIAGNOSIS — G8929 Other chronic pain: Secondary | ICD-10-CM | POA: Diagnosis not present

## 2017-02-21 DIAGNOSIS — Z91040 Latex allergy status: Secondary | ICD-10-CM

## 2017-02-21 DIAGNOSIS — M549 Dorsalgia, unspecified: Secondary | ICD-10-CM | POA: Diagnosis not present

## 2017-02-21 DIAGNOSIS — F329 Major depressive disorder, single episode, unspecified: Secondary | ICD-10-CM | POA: Diagnosis not present

## 2017-02-21 DIAGNOSIS — R51 Headache: Secondary | ICD-10-CM | POA: Diagnosis not present

## 2017-02-21 DIAGNOSIS — Z7951 Long term (current) use of inhaled steroids: Secondary | ICD-10-CM

## 2017-02-21 DIAGNOSIS — F419 Anxiety disorder, unspecified: Secondary | ICD-10-CM | POA: Diagnosis present

## 2017-02-21 DIAGNOSIS — H9190 Unspecified hearing loss, unspecified ear: Secondary | ICD-10-CM | POA: Diagnosis not present

## 2017-02-21 DIAGNOSIS — Y92128 Other place in nursing home as the place of occurrence of the external cause: Secondary | ICD-10-CM

## 2017-02-21 DIAGNOSIS — I10 Essential (primary) hypertension: Secondary | ICD-10-CM | POA: Diagnosis not present

## 2017-02-21 DIAGNOSIS — S065X9A Traumatic subdural hemorrhage with loss of consciousness of unspecified duration, initial encounter: Secondary | ICD-10-CM | POA: Diagnosis not present

## 2017-02-21 DIAGNOSIS — M542 Cervicalgia: Secondary | ICD-10-CM | POA: Diagnosis not present

## 2017-02-21 DIAGNOSIS — S199XXA Unspecified injury of neck, initial encounter: Secondary | ICD-10-CM | POA: Diagnosis not present

## 2017-02-21 DIAGNOSIS — M79601 Pain in right arm: Secondary | ICD-10-CM | POA: Diagnosis present

## 2017-02-21 DIAGNOSIS — W19XXXA Unspecified fall, initial encounter: Secondary | ICD-10-CM

## 2017-02-21 DIAGNOSIS — S065X0A Traumatic subdural hemorrhage without loss of consciousness, initial encounter: Secondary | ICD-10-CM | POA: Diagnosis not present

## 2017-02-21 LAB — CBC WITH DIFFERENTIAL/PLATELET
Basophils Absolute: 0 10*3/uL (ref 0.0–0.1)
Basophils Relative: 0 %
EOS PCT: 0 %
Eosinophils Absolute: 0 10*3/uL (ref 0.0–0.7)
HEMATOCRIT: 37.2 % (ref 36.0–46.0)
HEMOGLOBIN: 12.2 g/dL (ref 12.0–15.0)
LYMPHS ABS: 1.4 10*3/uL (ref 0.7–4.0)
LYMPHS PCT: 16 %
MCH: 28.6 pg (ref 26.0–34.0)
MCHC: 32.8 g/dL (ref 30.0–36.0)
MCV: 87.1 fL (ref 78.0–100.0)
Monocytes Absolute: 0.4 10*3/uL (ref 0.1–1.0)
Monocytes Relative: 5 %
NEUTROS ABS: 7 10*3/uL (ref 1.7–7.7)
Neutrophils Relative %: 79 %
PLATELETS: 205 10*3/uL (ref 150–400)
RBC: 4.27 MIL/uL (ref 3.87–5.11)
RDW: 14.5 % (ref 11.5–15.5)
WBC: 8.8 10*3/uL (ref 4.0–10.5)

## 2017-02-21 LAB — BASIC METABOLIC PANEL
Anion gap: 11 (ref 5–15)
BUN: 37 mg/dL — ABNORMAL HIGH (ref 6–20)
CO2: 24 mmol/L (ref 22–32)
Calcium: 9.9 mg/dL (ref 8.9–10.3)
Chloride: 106 mmol/L (ref 101–111)
Creatinine, Ser: 1.81 mg/dL — ABNORMAL HIGH (ref 0.44–1.00)
GFR calc Af Amer: 28 mL/min — ABNORMAL LOW (ref >60)
GFR calc non Af Amer: 25 mL/min — ABNORMAL LOW (ref >60)
Glucose, Bld: 117 mg/dL — ABNORMAL HIGH (ref 65–99)
Potassium: 4.1 mmol/L (ref 3.5–5.1)
Sodium: 141 mmol/L (ref 135–145)

## 2017-02-21 LAB — URINALYSIS, ROUTINE W REFLEX MICROSCOPIC
Bacteria, UA: NONE SEEN
Bilirubin Urine: NEGATIVE
Glucose, UA: NEGATIVE mg/dL
Ketones, ur: NEGATIVE mg/dL
Leukocytes, UA: NEGATIVE
Nitrite: NEGATIVE
Protein, ur: 100 mg/dL — AB
Specific Gravity, Urine: 1.01 (ref 1.005–1.030)
pH: 6 (ref 5.0–8.0)

## 2017-02-21 LAB — I-STAT CG4 LACTIC ACID, ED: LACTIC ACID, VENOUS: 1.12 mmol/L (ref 0.5–1.9)

## 2017-02-21 MED ORDER — SODIUM CHLORIDE 0.9 % IV SOLN
500.0000 mg | Freq: Two times a day (BID) | INTRAVENOUS | Status: DC
  Administered 2017-02-22 (×2): 500 mg via INTRAVENOUS
  Filled 2017-02-21 (×4): qty 5

## 2017-02-21 MED ORDER — ACETAMINOPHEN 500 MG PO TABS
1000.0000 mg | ORAL_TABLET | Freq: Once | ORAL | Status: DC
  Filled 2017-02-21: qty 2
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
## 2017-02-21 NOTE — ED Notes (Signed)
Attempted to get blood on pt but you need at least 2 ppl to draw blood

## 2017-02-21 NOTE — ED Notes (Signed)
Jake, RN unable to obtain IV via U/S. However, blood was drawn and sent to lab.

## 2017-02-21 NOTE — ED Notes (Signed)
Bed: WA07 Expected date:  Expected time:  Means of arrival:  Comments: Fall

## 2017-02-21 NOTE — ED Notes (Signed)
When pt used the commode the urine was contaminated

## 2017-02-21 NOTE — ED Notes (Signed)
Hold lab draw @ this time per RN.

## 2017-02-21 NOTE — ED Notes (Signed)
IV unable to start IV x 3, U/S used. PA and Dr. Canary Brim aware. Pt needs repeat CBC due to clotted tube on first collect. Lab tech, Ebony aware.

## 2017-02-21 NOTE — ED Notes (Signed)
Attempted to place an ultrasound IV. Able to obtain labs, but IV infiltrated. RN made aware. Family notified that there is no IV ordered at this time, but if one is ordered. IV team may need to be consulted.

## 2017-02-21 NOTE — ED Notes (Signed)
Attempted IV start x 1. Pt's daughter states that "anesthesia usually starts her IVs" Jake, RN asked to start U/S IV.

## 2017-02-21 NOTE — Consult Note (Signed)
Received call from Dalia Heading, PA-C in regards to patient Reportedly had a witnessed fall striking head with + LOC CT scan shows small SDH History of dementia. Family reports at baseline Advised pt would be admitted under hospitalist service. Believe this would be appropriate based on her chronic medical issues. Rec transfer to [facility] in case something changes acutely.  At this time, no surgical intervention indicated. Will continue to monitor Neuro exam q 1 hour Repeat CT in 12 hours. Sooner as needed for worsening neuro exam Keppra 500mg  BID x7days for seizure prophylaxis Will see patient in am, sooner as necessary Call for any concerns

## 2017-02-21 NOTE — ED Triage Notes (Signed)
Pt BIB EMS. Pt is from [facility]. Pt had witnessed fall from standing position. Pt has hx of dementia. She has an abrasion to her L cheek and lateral side of L eyebrow. Bleeding controlled. She has a conjunctival hematoma to her L eye. She is alert to self. No acute distress.

## 2017-02-22 ENCOUNTER — Inpatient Hospital Stay (HOSPITAL_COMMUNITY): Payer: Medicare Other

## 2017-02-22 DIAGNOSIS — Y92128 Other place in nursing home as the place of occurrence of the external cause: Secondary | ICD-10-CM | POA: Diagnosis not present

## 2017-02-22 DIAGNOSIS — S065X9A Traumatic subdural hemorrhage with loss of consciousness of unspecified duration, initial encounter: Secondary | ICD-10-CM | POA: Diagnosis present

## 2017-02-22 DIAGNOSIS — M109 Gout, unspecified: Secondary | ICD-10-CM | POA: Diagnosis present

## 2017-02-22 DIAGNOSIS — M79601 Pain in right arm: Secondary | ICD-10-CM | POA: Diagnosis not present

## 2017-02-22 DIAGNOSIS — F419 Anxiety disorder, unspecified: Secondary | ICD-10-CM | POA: Diagnosis present

## 2017-02-22 DIAGNOSIS — S59901A Unspecified injury of right elbow, initial encounter: Secondary | ICD-10-CM | POA: Diagnosis not present

## 2017-02-22 DIAGNOSIS — S59911A Unspecified injury of right forearm, initial encounter: Secondary | ICD-10-CM | POA: Diagnosis not present

## 2017-02-22 DIAGNOSIS — E119 Type 2 diabetes mellitus without complications: Secondary | ICD-10-CM | POA: Diagnosis present

## 2017-02-22 DIAGNOSIS — S4991XA Unspecified injury of right shoulder and upper arm, initial encounter: Secondary | ICD-10-CM | POA: Diagnosis present

## 2017-02-22 DIAGNOSIS — F329 Major depressive disorder, single episode, unspecified: Secondary | ICD-10-CM | POA: Diagnosis present

## 2017-02-22 DIAGNOSIS — Z888 Allergy status to other drugs, medicaments and biological substances status: Secondary | ICD-10-CM | POA: Diagnosis not present

## 2017-02-22 DIAGNOSIS — F0390 Unspecified dementia without behavioral disturbance: Secondary | ICD-10-CM | POA: Diagnosis present

## 2017-02-22 DIAGNOSIS — I10 Essential (primary) hypertension: Secondary | ICD-10-CM | POA: Diagnosis present

## 2017-02-22 DIAGNOSIS — H9190 Unspecified hearing loss, unspecified ear: Secondary | ICD-10-CM | POA: Diagnosis present

## 2017-02-22 DIAGNOSIS — Z91040 Latex allergy status: Secondary | ICD-10-CM | POA: Diagnosis not present

## 2017-02-22 DIAGNOSIS — S065XAA Traumatic subdural hemorrhage with loss of consciousness status unknown, initial encounter: Secondary | ICD-10-CM | POA: Diagnosis present

## 2017-02-22 DIAGNOSIS — M549 Dorsalgia, unspecified: Secondary | ICD-10-CM | POA: Diagnosis present

## 2017-02-22 DIAGNOSIS — Z87891 Personal history of nicotine dependence: Secondary | ICD-10-CM | POA: Diagnosis not present

## 2017-02-22 DIAGNOSIS — Z79899 Other long term (current) drug therapy: Secondary | ICD-10-CM | POA: Diagnosis not present

## 2017-02-22 DIAGNOSIS — H409 Unspecified glaucoma: Secondary | ICD-10-CM | POA: Diagnosis present

## 2017-02-22 DIAGNOSIS — W1839XA Other fall on same level, initial encounter: Secondary | ICD-10-CM | POA: Diagnosis not present

## 2017-02-22 DIAGNOSIS — Z841 Family history of disorders of kidney and ureter: Secondary | ICD-10-CM | POA: Diagnosis not present

## 2017-02-22 DIAGNOSIS — E7800 Pure hypercholesterolemia, unspecified: Secondary | ICD-10-CM | POA: Diagnosis present

## 2017-02-22 DIAGNOSIS — G8929 Other chronic pain: Secondary | ICD-10-CM | POA: Diagnosis present

## 2017-02-22 DIAGNOSIS — S065X0A Traumatic subdural hemorrhage without loss of consciousness, initial encounter: Secondary | ICD-10-CM | POA: Diagnosis not present

## 2017-02-22 DIAGNOSIS — Z7951 Long term (current) use of inhaled steroids: Secondary | ICD-10-CM | POA: Diagnosis not present

## 2017-02-22 DIAGNOSIS — S6991XA Unspecified injury of right wrist, hand and finger(s), initial encounter: Secondary | ICD-10-CM | POA: Diagnosis not present

## 2017-02-22 DIAGNOSIS — Z9109 Other allergy status, other than to drugs and biological substances: Secondary | ICD-10-CM | POA: Diagnosis not present

## 2017-02-22 LAB — GLUCOSE, CAPILLARY: Glucose-Capillary: 107 mg/dL — ABNORMAL HIGH (ref 65–99)

## 2017-02-22 LAB — MRSA PCR SCREENING: MRSA BY PCR: NEGATIVE

## 2017-02-22 MED ORDER — POTASSIUM CHLORIDE CRYS ER 10 MEQ PO TBCR
10.0000 meq | EXTENDED_RELEASE_TABLET | Freq: Every day | ORAL | Status: DC
  Administered 2017-02-22: 10 meq via ORAL
  Filled 2017-02-22 (×2): qty 1

## 2017-02-22 MED ORDER — BRIMONIDINE TARTRATE 0.15 % OP SOLN
1.0000 [drp] | Freq: Every day | OPHTHALMIC | Status: DC
  Administered 2017-02-22: 1 [drp] via OPHTHALMIC
  Filled 2017-02-22: qty 5

## 2017-02-22 MED ORDER — LEVETIRACETAM 500 MG PO TABS: 500.0000 mg | ORAL_TABLET | Freq: Two times a day (BID) | ORAL | 0 refills | Status: DC

## 2017-02-22 MED ORDER — SODIUM CHLORIDE 0.9% FLUSH: 3.0000 mL | INTRAVENOUS | Status: DC | PRN

## 2017-02-22 MED ORDER — LABETALOL HCL 5 MG/ML IV SOLN
10.0000 mg | INTRAVENOUS | Status: DC | PRN
  Administered 2017-02-22 (×2): 10 mg via INTRAVENOUS
  Filled 2017-02-22 (×2): qty 4

## 2017-02-22 MED ORDER — SODIUM CHLORIDE 0.9 % IV SOLN
INTRAVENOUS | Status: DC
  Administered 2017-02-22: 02:00:00 via INTRAVENOUS

## 2017-02-22 MED ORDER — PANTOPRAZOLE SODIUM 40 MG IV SOLR
40.0000 mg | INTRAVENOUS | Status: DC
  Administered 2017-02-22: 40 mg via INTRAVENOUS
  Filled 2017-02-22: qty 40

## 2017-02-22 MED ORDER — HYDROCODONE-ACETAMINOPHEN 5-325 MG PO TABS: 1.0000 | ORAL_TABLET | ORAL | Status: DC | PRN

## 2017-02-22 MED ORDER — LATANOPROST 0.005 % OP SOLN
1.0000 [drp] | Freq: Every day | OPHTHALMIC | Status: DC
  Administered 2017-02-22: 1 [drp] via OPHTHALMIC
  Filled 2017-02-22: qty 2.5

## 2017-02-22 MED ORDER — MAGNESIUM CITRATE PO SOLN: 1.0000 | Freq: Once | ORAL | Status: DC | PRN

## 2017-02-22 MED ORDER — PANTOPRAZOLE SODIUM 40 MG PO TBEC: 40.0000 mg | DELAYED_RELEASE_TABLET | Freq: Every day | ORAL | Status: DC

## 2017-02-22 MED ORDER — ONDANSETRON HCL 4 MG/2ML IJ SOLN
4.0000 mg | Freq: Four times a day (QID) | INTRAMUSCULAR | Status: DC | PRN
  Administered 2017-02-22: 4 mg via INTRAVENOUS
  Filled 2017-02-22: qty 2

## 2017-02-22 MED ORDER — ONDANSETRON HCL 4 MG PO TABS: 4.0000 mg | ORAL_TABLET | Freq: Four times a day (QID) | ORAL | Status: DC | PRN

## 2017-02-22 MED ORDER — BISACODYL 5 MG PO TBEC
5.0000 mg | DELAYED_RELEASE_TABLET | Freq: Every day | ORAL | Status: DC | PRN
  Filled 2017-02-22: qty 1

## 2017-02-22 MED ORDER — SODIUM CHLORIDE 0.9 % IV SOLN: 250.0000 mL | INTRAVENOUS | Status: DC | PRN

## 2017-02-22 MED ORDER — SENNOSIDES-DOCUSATE SODIUM 8.6-50 MG PO TABS
1.0000 | ORAL_TABLET | Freq: Every evening | ORAL | Status: DC | PRN
  Filled 2017-02-22: qty 1

## 2017-02-22 MED ORDER — MORPHINE SULFATE (PF) 4 MG/ML IV SOLN
2.0000 mg | Freq: Once | INTRAVENOUS | Status: AC
  Administered 2017-02-22: 2 mg via INTRAVENOUS
  Filled 2017-02-22: qty 1

## 2017-02-22 MED ORDER — SODIUM CHLORIDE 0.9% FLUSH
3.0000 mL | Freq: Two times a day (BID) | INTRAVENOUS | Status: DC
  Administered 2017-02-22: 3 mL via INTRAVENOUS

## 2017-02-22 MED ORDER — ACETAMINOPHEN 650 MG RE SUPP: 650.0000 mg | Freq: Four times a day (QID) | RECTAL | Status: DC | PRN

## 2017-02-22 MED ORDER — SENNA 8.6 MG PO TABS
1.0000 | ORAL_TABLET | Freq: Two times a day (BID) | ORAL | Status: DC
  Administered 2017-02-22: 8.6 mg via ORAL
  Filled 2017-02-22 (×2): qty 1

## 2017-02-22 MED ORDER — ACETAMINOPHEN 325 MG PO TABS: 650.0000 mg | ORAL_TABLET | Freq: Four times a day (QID) | ORAL | Status: DC | PRN

## 2017-02-22 MED ORDER — ALLOPURINOL 100 MG PO TABS
150.0000 mg | ORAL_TABLET | Freq: Every day | ORAL | Status: DC
  Administered 2017-02-22: 150 mg via ORAL
  Filled 2017-02-22: qty 2
  Filled 2017-02-22: qty 1

## 2017-02-22 MED ORDER — TRAZODONE HCL 50 MG PO TABS: 25.0000 mg | ORAL_TABLET | Freq: Every day | ORAL | Status: DC

## 2017-02-22 MED ORDER — MEMANTINE HCL ER 14 MG PO CP24
14.0000 mg | ORAL_CAPSULE | Freq: Every day | ORAL | Status: DC
  Administered 2017-02-22: 14 mg via ORAL
  Filled 2017-02-22: qty 1

## 2017-02-22 MED ORDER — LORAZEPAM 2 MG/ML IJ SOLN
0.5000 mg | Freq: Once | INTRAMUSCULAR | Status: AC
  Administered 2017-02-22: 0.5 mg via INTRAVENOUS
  Filled 2017-02-22: qty 1

## 2017-02-22 MED ORDER — ATORVASTATIN CALCIUM 10 MG PO TABS
20.0000 mg | ORAL_TABLET | Freq: Every day | ORAL | Status: DC
  Administered 2017-02-22: 20 mg via ORAL
  Filled 2017-02-22: qty 1
  Filled 2017-02-22: qty 2

## 2017-02-22 MED ORDER — FLUTICASONE PROPIONATE 50 MCG/ACT NA SUSP
1.0000 | Freq: Two times a day (BID) | NASAL | Status: DC
  Administered 2017-02-22: 1 via NASAL
  Filled 2017-02-22: qty 16

## 2017-02-22 MED ORDER — ESCITALOPRAM OXALATE 10 MG PO TABS
20.0000 mg | ORAL_TABLET | Freq: Every day | ORAL | Status: DC
  Administered 2017-02-22: 20 mg via ORAL
  Filled 2017-02-22 (×2): qty 2

## 2017-02-22 MED ORDER — MIRTAZAPINE 15 MG PO TABS: 30.0000 mg | ORAL_TABLET | Freq: Every day | ORAL | Status: DC

## 2017-02-22 MED ORDER — FUROSEMIDE 20 MG PO TABS
20.0000 mg | ORAL_TABLET | Freq: Every day | ORAL | Status: DC
  Administered 2017-02-22: 20 mg via ORAL
  Filled 2017-02-22: qty 1
  Filled 2017-02-22: qty 0.5

## 2017-02-22 NOTE — Progress Notes (Signed)
Pt arrived to unit via bed. No s/s pain or discomfort. Family and pt oriented to unit.
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
## 2017-02-22 NOTE — Discharge Summary (Signed)
Physician Discharge Summary  Patient ID: Tanya Smith MRN: [id number] DOB/AGE: 01-Jan-1930 83 y.o.  Admit date: 02/21/2017 Discharge date: 02/22/2017  Admission Diagnoses:  SDH  Discharge Diagnoses:  Same Active Problems:   SDH (subdural hematoma) (HCC) Right upper extremity injury from fall  Discharged Condition: Stable  Hospital Course:  Tanya Smith is a 83 y.o. female who was admitted for observation after falling and striking head resulting in SDH. Repeat CT scan shows stable SDH without evidence of additional bleeding. She remains at neuro baseline. At this point, she is cleared from NS standpoint for discharge. Complete 7 day course of Keppra. Of note, there was concern for possible RUE injury from the fall. Xrays were normal. She will follow up outpt with [facility] and call her PCP for follow up as well.  Treatments: Surgery - None  Discharge Exam: Blood pressure (!) 187/76, pulse (!) 57, temperature 98.6 F (37 C), temperature source Axillary, resp. rate 18, SpO2 100 %. Awake, alert, oriented Speech fluent, appropriate CN grossly intact 5/5 BUE/BLE Wound c/d/i  Disposition: 01-Home or Self Care  Discharge Instructions    Call MD for:  difficulty breathing, headache or visual disturbances    Complete by:  As directed    Call MD for:  persistant dizziness or light-headedness    Complete by:  As directed    Call MD for:  redness, tenderness, or signs of infection (pain, swelling, redness, odor or green/yellow discharge around incision site)    Complete by:  As directed    Call MD for:  severe uncontrolled pain    Complete by:  As directed    Call MD for:  temperature >100.4    Complete by:  As directed    Diet general    Complete by:  As directed    Increase activity slowly    Complete by:  As directed      Allergies as of 02/22/2017      Reactions   Furosemide Other (See Comments)   Unknown. Per MAR. Can take brand name Lasix.    Sertraline Other (See Comments)    Unknown; per physician's orders    Latex Rash   Other Rash   Soap & deodorant.   Tape Rash      Medication List    TAKE these medications   acetaminophen 500 MG tablet Commonly known as:  TYLENOL Take 500 mg by mouth every 4 (four) hours as needed for pain or fever.   allopurinol 300 MG tablet Commonly known as:  ZYLOPRIM Take 150 mg by mouth daily.   aluminum-magnesium hydroxide-simethicone 591-638-46 MG/5ML Susp Commonly known as:  MAALOX Take 30 mLs by mouth daily as needed (heartburn/indigestion.). Max 4 doses/24hrs.   atorvastatin 20 MG tablet Commonly known as:  LIPITOR Take 20 mg by mouth daily.   bisacodyl 5 MG EC tablet Generic drug:  bisacodyl Take 10 mg by mouth at bedtime.   brimonidine 0.1 % Soln Commonly known as:  ALPHAGAN P Place 1 drop into both eyes daily.   cetirizine 10 MG tablet Commonly known as:  ZYRTEC Take 10 mg by mouth daily.   escitalopram 20 MG tablet Commonly known as:  LEXAPRO Take 20 mg by mouth daily.   fluticasone 50 MCG/ACT nasal spray Commonly known as:  FLONASE Place 1 spray into both nostrils 2 (two) times daily.   furosemide 20 MG tablet Commonly known as:  LASIX Take 20 mg by mouth daily.   guaiFENesin 100 MG/5ML liquid Commonly  known as:  ROBITUSSIN Take 200 mg by mouth every 6 (six) hours as needed for cough.   ibuprofen 400 MG tablet Commonly known as:  ADVIL,MOTRIN Take 1 tablet (400 mg total) by mouth every 8 (eight) hours as needed.   levETIRAcetam 500 MG tablet Commonly known as:  KEPPRA Take 1 tablet (500 mg total) by mouth 2 (two) times daily.   loperamide 2 MG tablet Commonly known as:  IMODIUM A-D Take 2 mg by mouth as needed for diarrhea or loose stools. Take 1 tablet with each loose stool - max of 8 tablets in a day   magnesium citrate Soln Take 0.5 Bottles by mouth every three (3) days as needed (constipation).   magnesium hydroxide 400 MG/5ML suspension Commonly known as:  MILK OF  MAGNESIA Take 30 mLs by mouth at bedtime as needed for constipation.   mirtazapine 30 MG tablet Commonly known as:  REMERON Take 30 mg by mouth at bedtime.   NAMENDA XR 14 MG Cp24 24 hr capsule Generic drug:  memantine Take 14 mg by mouth daily.   neomycin-bacitracin-polymyxin ointment Commonly known as:  NEOSPORIN Apply 1 application topically as needed for wound care. apply to eye   NUTRITIONAL DRINK Liqd Take 1 Units by mouth daily.   nystatin-triamcinolone cream Commonly known as:  MYCOLOG II Apply 1 application topically 2 (two) times daily.   potassium chloride 10 MEQ tablet Commonly known as:  K-DUR,KLOR-CON Take 10 mEq by mouth daily.   senna 8.6 MG tablet Commonly known as:  SENOKOT Take 2 tablets by mouth daily.   Travoprost (BAK Free) 0.004 % Soln ophthalmic solution Commonly known as:  TRAVATAN Place 1 drop into both eyes at bedtime.   traZODone 50 MG tablet Commonly known as:  DESYREL Take 25 mg by mouth at bedtime. Hold dose if too drowsy.      Follow-up Information    COSTELLA, VINCENT J, PA-C. Schedule an appointment as soon as possible for a visit in 2 day(s).   Specialty:  Physician Assistant Why:  SDH follow up Contact information: [place] [address] [phone]           Signed: Traci Sermon 02/22/2017, 2:56 PM

## 2017-02-25 ENCOUNTER — Emergency Department (HOSPITAL_COMMUNITY): Payer: Medicare Other

## 2017-02-25 ENCOUNTER — Emergency Department (HOSPITAL_COMMUNITY)
Admission: EM | Admit: 2017-02-25 | Discharge: 2017-02-25 | Disposition: A | Discharge status: Home / Self Care | Payer: Medicare Other | Attending: Emergency Medicine | Admitting: Emergency Medicine

## 2017-02-25 ENCOUNTER — Encounter (HOSPITAL_COMMUNITY): Payer: Self-pay | Admitting: Emergency Medicine

## 2017-02-25 DIAGNOSIS — W1830XA Fall on same level, unspecified, initial encounter: Secondary | ICD-10-CM | POA: Diagnosis not present

## 2017-02-25 DIAGNOSIS — S0592XA Unspecified injury of left eye and orbit, initial encounter: Secondary | ICD-10-CM | POA: Diagnosis present

## 2017-02-25 DIAGNOSIS — S299XXA Unspecified injury of thorax, initial encounter: Secondary | ICD-10-CM | POA: Diagnosis not present

## 2017-02-25 DIAGNOSIS — F0280 Dementia in other diseases classified elsewhere without behavioral disturbance: Secondary | ICD-10-CM | POA: Diagnosis not present

## 2017-02-25 DIAGNOSIS — R4182 Altered mental status, unspecified: Secondary | ICD-10-CM | POA: Diagnosis not present

## 2017-02-25 DIAGNOSIS — Y999 Unspecified external cause status: Secondary | ICD-10-CM | POA: Diagnosis not present

## 2017-02-25 DIAGNOSIS — E119 Type 2 diabetes mellitus without complications: Secondary | ICD-10-CM | POA: Insufficient documentation

## 2017-02-25 DIAGNOSIS — I130 Hypertensive heart and chronic kidney disease with heart failure and stage 1 through stage 4 chronic kidney disease, or unspecified chronic kidney disease: Secondary | ICD-10-CM | POA: Diagnosis not present

## 2017-02-25 DIAGNOSIS — E860 Dehydration: Secondary | ICD-10-CM | POA: Insufficient documentation

## 2017-02-25 DIAGNOSIS — I10 Essential (primary) hypertension: Secondary | ICD-10-CM | POA: Diagnosis not present

## 2017-02-25 DIAGNOSIS — R52 Pain, unspecified: Secondary | ICD-10-CM

## 2017-02-25 DIAGNOSIS — N189 Chronic kidney disease, unspecified: Secondary | ICD-10-CM | POA: Insufficient documentation

## 2017-02-25 DIAGNOSIS — R930 Abnormal findings on diagnostic imaging of skull and head, not elsewhere classified: Secondary | ICD-10-CM | POA: Insufficient documentation

## 2017-02-25 DIAGNOSIS — Z87891 Personal history of nicotine dependence: Secondary | ICD-10-CM | POA: Insufficient documentation

## 2017-02-25 DIAGNOSIS — Z91040 Latex allergy status: Secondary | ICD-10-CM | POA: Insufficient documentation

## 2017-02-25 DIAGNOSIS — I509 Heart failure, unspecified: Secondary | ICD-10-CM | POA: Diagnosis not present

## 2017-02-25 DIAGNOSIS — S0990XA Unspecified injury of head, initial encounter: Secondary | ICD-10-CM | POA: Diagnosis not present

## 2017-02-25 DIAGNOSIS — Z79899 Other long term (current) drug therapy: Secondary | ICD-10-CM | POA: Diagnosis not present

## 2017-02-25 DIAGNOSIS — Y939 Activity, unspecified: Secondary | ICD-10-CM | POA: Insufficient documentation

## 2017-02-25 DIAGNOSIS — R531 Weakness: Secondary | ICD-10-CM | POA: Diagnosis not present

## 2017-02-25 DIAGNOSIS — S0512XA Contusion of eyeball and orbital tissues, left eye, initial encounter: Secondary | ICD-10-CM | POA: Diagnosis not present

## 2017-02-25 DIAGNOSIS — R627 Adult failure to thrive: Secondary | ICD-10-CM | POA: Diagnosis not present

## 2017-02-25 DIAGNOSIS — Y92129 Unspecified place in nursing home as the place of occurrence of the external cause: Secondary | ICD-10-CM | POA: Diagnosis not present

## 2017-02-25 DIAGNOSIS — R404 Transient alteration of awareness: Secondary | ICD-10-CM | POA: Diagnosis not present

## 2017-02-25 LAB — COMPREHENSIVE METABOLIC PANEL
ALBUMIN: 3.6 g/dL (ref 3.5–5.0)
ALT: 14 U/L (ref 14–54)
AST: 26 U/L (ref 15–41)
Alkaline Phosphatase: 91 U/L (ref 38–126)
Anion gap: 11 (ref 5–15)
BUN: 35 mg/dL — ABNORMAL HIGH (ref 6–20)
CHLORIDE: 108 mmol/L (ref 101–111)
CO2: 25 mmol/L (ref 22–32)
CREATININE: 1.7 mg/dL — AB (ref 0.44–1.00)
Calcium: 9.4 mg/dL (ref 8.9–10.3)
GFR calc Af Amer: 31 mL/min — ABNORMAL LOW (ref >60)
GFR calc non Af Amer: 26 mL/min — ABNORMAL LOW (ref >60)
Glucose, Bld: 79 mg/dL (ref 65–99)
Potassium: 3.6 mmol/L (ref 3.5–5.1)
SODIUM: 144 mmol/L (ref 135–145)
Total Bilirubin: 0.9 mg/dL (ref 0.3–1.2)
Total Protein: 7.7 g/dL (ref 6.5–8.1)

## 2017-02-25 LAB — CK: CK TOTAL: 125 U/L (ref 38–234)

## 2017-02-25 LAB — CBC WITH DIFFERENTIAL/PLATELET
Basophils Absolute: 0 10*3/uL (ref 0.0–0.1)
Basophils Relative: 0 %
EOS ABS: 0.1 10*3/uL (ref 0.0–0.7)
EOS PCT: 1 %
HCT: 39.2 % (ref 36.0–46.0)
HEMOGLOBIN: 12.8 g/dL (ref 12.0–15.0)
LYMPHS ABS: 1.6 10*3/uL (ref 0.7–4.0)
Lymphocytes Relative: 26 %
MCH: 28.6 pg (ref 26.0–34.0)
MCHC: 32.7 g/dL (ref 30.0–36.0)
MCV: 87.7 fL (ref 78.0–100.0)
MONOS PCT: 8 %
Monocytes Absolute: 0.5 10*3/uL (ref 0.1–1.0)
NEUTROS PCT: 65 %
Neutro Abs: 3.9 10*3/uL (ref 1.7–7.7)
Platelets: 188 10*3/uL (ref 150–400)
RBC: 4.47 MIL/uL (ref 3.87–5.11)
RDW: 14.3 % (ref 11.5–15.5)
WBC: 6.1 10*3/uL (ref 4.0–10.5)

## 2017-02-25 LAB — URINALYSIS, DIPSTICK ONLY
Bilirubin Urine: NEGATIVE
GLUCOSE, UA: NEGATIVE mg/dL
Hgb urine dipstick: NEGATIVE
Ketones, ur: 20 mg/dL — AB
Leukocytes, UA: NEGATIVE
Nitrite: NEGATIVE
Protein, ur: 100 mg/dL — AB
SPECIFIC GRAVITY, URINE: 1.018 (ref 1.005–1.030)
pH: 5 (ref 5.0–8.0)

## 2017-02-25 LAB — CBG MONITORING, ED: GLUCOSE-CAPILLARY: 68 mg/dL (ref 65–99)

## 2017-02-25 LAB — I-STAT TROPONIN, ED: TROPONIN I, POC: 0.03 ng/mL (ref 0.00–0.08)

## 2017-02-25 MED ORDER — HYDRALAZINE HCL 25 MG PO TABS
25.0000 mg | ORAL_TABLET | Freq: Once | ORAL | Status: AC
  Administered 2017-02-25: 25 mg via ORAL
  Filled 2017-02-25: qty 1

## 2017-02-25 MED ORDER — THIAMINE HCL 100 MG/ML IJ SOLN
100.0000 mg | Freq: Once | INTRAMUSCULAR | Status: AC
  Administered 2017-02-25: 100 mg via INTRAVENOUS
  Filled 2017-02-25: qty 2

## 2017-02-25 MED ORDER — SODIUM CHLORIDE 0.9 % IV BOLUS (SEPSIS)
1000.0000 mL | Freq: Once | INTRAVENOUS | Status: AC
  Administered 2017-02-25: 1000 mL via INTRAVENOUS

## 2017-02-25 NOTE — ED Notes (Addendum)
Discharge instructions and follow up care reviewed. Verbalized understanding.

## 2017-02-25 NOTE — Consult Note (Signed)
Consultation Note Date: 02/25/2017   Patient Name: Tanya Smith  DOB: 08-01-1930  MRN: [id number]  Age / Sex: 83 y.o., female  PCP: Lavone Orn, MD Referring Physician: Orlie Dakin, MD  Reason for Consultation: Establishing goals of care  HPI/Patient Profile: 82 y.o. female  with past medical history of dementia, recent fall and SDH  Seen in the ED on 02/25/2017 with not eating, ongoing weakness, ongoing restlessness.   Clinical Assessment and Goals of Care:  83 yo lady with dementia, recent hospitalization for fall and SDH, she was observed for 24 hours, given Keppra and D/C back to [place] where she has been for the past 6-7 years. Patient was diagnosed with dementia for the past 15 years or so, per her daughter, primary historian. For the past 2 years, patient has had worsening of her cognitive functions, not able to talk meaningfully, not able to recognize her daughter. She has been wandering and falling several times. Now, for the past 1 to 2 weeks, the patient is not eating much at all, at times, she is sleeping more and is less awake, when awake, she is more restless and agitated. Hence, the patient was brought to the ED by her daughter this evening.   I met with the patient and her daughter, introduced palliative care as follows: Palliative medicine is specialized medical care for people living with serious illness. It focuses on providing relief from the symptoms and stress of a serious illness. The goal is to improve quality of life for both the patient and the family.  Patient's daughter states that she has prepared herself for losing her mother, she just doesn't want her mother to suffer. We discussed about dementia trajectory, the decline stages, we discussed about not utilizing artificial means, we discussed about keeping the patient comfortable and using hospice services as an extra layer of  support.       NEXT OF KIN  daughter, she has 3 sons, one is a Administrator. Daughter is the most involved with the patient's care and is her primary decision maker.   SUMMARY OF RECOMMENDATIONS    DNR DNI Home with Hospice, appreciate case manager consult to facilitate this. Patient might soon need to be transferred to [facility], residential hospice, based on her ongoing decline, this has been discussed with her daughter.  Daughter, primary caregiver does not want patient to return to [facility] Dementia with agitation, rapid decline, accelerated since the recent fall and SDH likely accelerating the patient's dying process, placing her prognosis at less than 3-4 weeks or so, at this point, discussed frankly and compassionately with the patient.   Code Status/Advance Care Planning:  DNR    Symptom Management:    continue current mode of care.   Palliative Prophylaxis:   Delirium Protocol  Additional Recommendations (Limitations, Scope, Preferences):  Full Comfort Care  Psycho-social/Spiritual:   Desire for further Chaplaincy support:no  Additional Recommendations: Education on Hospice  Prognosis:   < 4 weeks  Discharge Planning: Home with Hospice  Primary Diagnoses: Dementia  I have reviewed the medical record, interviewed the patient and family, and examined the patient. The following aspects are pertinent.  Past Medical History:  Diagnosis Date  . Abdominal muscle defects   . Anxiety   . Bladder prolapse   . CHF (congestive heart failure) ([place])   . Chronic back pain   . Constipation   . Dementia   . Depression   . Diabetes mellitus   . Glaucoma   . Gout   . Hearing loss   . High cholesterol   . Hypertension   . Postnasal drip   . Renal disorder   . Seasonal allergies   . Urinary incontinence   . Vertigo    Social History   Social History  . Marital status: Legally Separated    Spouse name: Winferd Humphrey  . Number of children: 6  .  Years of education: 11th   Social History Main Topics  . Smoking status: Former Smoker    Packs/day: 0.50    Types: Cigarettes  . Smokeless tobacco: Never Used  . Alcohol use No     Comment: occasionally; quit drinking in 1992  . Drug use: No  . Sexual activity: Not Asked   Other Topics Concern  . None   Social History Narrative   Pt lives at home with her family.   Caffeine Use: Rarely   Family History  Problem Relation Age of Onset  . Kidney disease Father    Scheduled Meds: . hydrALAZINE  25 mg Oral Once   Continuous Infusions: PRN Meds:. Medications Prior to Admission:  Prior to Admission medications   Medication Sig Start Date End Date Taking? Authorizing Provider  allopurinol (ZYLOPRIM) 300 MG tablet Take 150 mg by mouth daily.   Yes Historical Provider, MD  atorvastatin (LIPITOR) 20 MG tablet Take 20 mg by mouth daily.   Yes Historical Provider, MD  bisacodyl (BISACODYL) 5 MG EC tablet Take 10 mg by mouth at bedtime.    Yes Historical Provider, MD  brimonidine (ALPHAGAN P) 0.1 % SOLN Place 1 drop into both eyes daily.   Yes Historical Provider, MD  cetirizine (ZYRTEC) 10 MG tablet Take 10 mg by mouth daily.   Yes Historical Provider, MD  escitalopram (LEXAPRO) 20 MG tablet Take 20 mg by mouth daily.   Yes Historical Provider, MD  fluticasone (FLONASE) 50 MCG/ACT nasal spray Place 1 spray into both nostrils 2 (two) times daily.    Yes Historical Provider, MD  furosemide (LASIX) 20 MG tablet Take 20 mg by mouth daily.   Yes Historical Provider, MD  ibuprofen (ADVIL,MOTRIN) 400 MG tablet Take 1 tablet (400 mg total) by mouth every 8 (eight) hours as needed. 09/24/16  Yes Davonna Belling, MD  levETIRAcetam (KEPPRA) 500 MG tablet Take 1 tablet (500 mg total) by mouth 2 (two) times daily. 02/22/17 02/28/17 Yes Vincent J Costella, PA-C  memantine (NAMENDA XR) 14 MG CP24 24 hr capsule Take 14 mg by mouth daily.   Yes Historical Provider, MD  mirtazapine (REMERON) 30 MG tablet  Take 30 mg by mouth at bedtime.   Yes Historical Provider, MD  potassium chloride (K-DUR,KLOR-CON) 10 MEQ tablet Take 10 mEq by mouth daily.    Yes Historical Provider, MD  senna (SENOKOT) 8.6 MG tablet Take 2 tablets by mouth daily.    Yes Historical Provider, MD  Travoprost, BAK Free, (TRAVATAN) 0.004 % SOLN ophthalmic solution Place 1 drop into both eyes at bedtime.   Yes Historical Provider,  MD  traZODone (DESYREL) 50 MG tablet Take 25 mg by mouth at bedtime. Hold dose if too drowsy.   Yes Historical Provider, MD  acetaminophen (TYLENOL) 500 MG tablet Take 500 mg by mouth every 4 (four) hours as needed for pain or fever.    Historical Provider, MD  aluminum-magnesium hydroxide-simethicone (MAALOX) 008-676-19 MG/5ML SUSP Take 30 mLs by mouth daily as needed (heartburn/indigestion.). Max 4 doses/24hrs.    Historical Provider, MD  guaiFENesin (ROBITUSSIN) 100 MG/5ML liquid Take 200 mg by mouth every 6 (six) hours as needed for cough.    Historical Provider, MD  loperamide (IMODIUM A-D) 2 MG tablet Take 2 mg by mouth as needed for diarrhea or loose stools. Take 1 tablet with each loose stool - max of 8 tablets in a day    Historical Provider, MD  magnesium citrate SOLN Take 0.5 Bottles by mouth every three (3) days as needed (constipation).    Historical Provider, MD  magnesium hydroxide (MILK OF MAGNESIA) 400 MG/5ML suspension Take 30 mLs by mouth at bedtime as needed for constipation.    Historical Provider, MD  neomycin-bacitracin-polymyxin (NEOSPORIN) ointment Apply 1 application topically as needed for wound care. apply to eye    Historical Provider, MD  Nutritional Supplements (NUTRITIONAL DRINK) LIQD Take 1 Units by mouth daily.    Historical Provider, MD  nystatin-triamcinolone (MYCOLOG II) cream Apply 1 application topically 2 (two) times daily.    Historical Provider, MD   Allergies  Allergen Reactions  . Furosemide Other (See Comments)    Unknown. Per MAR. Can take brand name Lasix.   .  Sertraline Other (See Comments)    Unknown; per physician's orders   . Latex Rash  . Other Rash    Soap & deodorant.  . Tape Rash   Review of Systems Non verbal  Physical Exam Weak elderly lady S1 S2 Clear Abdomen soft No edema Non verbal Restless Bruises on L side of face/eye  Vital Signs: BP (!) 187/115 (BP Location: Left Arm)   Pulse 74   Temp 98.4 F (36.9 C) (Oral)   Resp 15   Ht '5\' 6"'  (1.676 m)   Wt 78.5 kg (173 lb)   SpO2 95%   BMI 27.92 kg/m          SpO2: SpO2: 95 % O2 Device:SpO2: 95 % O2 Flow Rate: .   IO: Intake/output summary: No intake or output data in the 24 hours ending 02/25/17 1752  LBM:   Baseline Weight: Weight: 78.5 kg (173 lb) Most recent weight: Weight: 78.5 kg (173 lb)     Palliative Assessment/Data:   Flowsheet Rows     Most Recent Value  Intake Tab  Referral Department  -- [ED]  Unit at Time of Referral  ER  Palliative Care Primary Diagnosis  Neurology  Palliative Care Type  New Palliative care  Reason for referral  Clarify Goals of Care, Counsel Regarding Hospice  Date first seen by Palliative Care  02/25/17  Clinical Assessment  Palliative Performance Scale Score  20%  Pain Max last 24 hours  4  Pain Min Last 24 hours  3  Dyspnea Max Last 24 Hours  4  Dyspnea Min Last 24 hours  3  Nausea Max Last 24 Hours  4  Nausea Min Last 24 Hours  3  Anxiety Max Last 24 Hours  4  Psychosocial & Spiritual Assessment  Palliative Care Outcomes  Patient/Family meeting held?  Yes  Who was at the meeting?  patient's  daughter primary caregiver   Palliative Care Outcomes  Clarified goals of care      Time In:  4.50 pm  Time Out:  5.50 Time Total:  60 min  Greater than 50%  of this time was spent counseling and coordinating care related to the above assessment and plan.  Signed by: Loistine Chance, MD  [phone]  Please contact Palliative Medicine Team phone at [phone] for questions and concerns.  For individual provider: See  Shea Evans

## 2017-02-25 NOTE — ED Notes (Signed)
Patient able to move all extremities
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
## 2017-02-25 NOTE — Discharge Instructions (Signed)
Hospice will follow up with you tomorrow. If you don't hear from them by noon tomorrow call the number listed for follow-up.

## 2017-02-25 NOTE — Care Management (Signed)
Patient being discharged with Hospice offered choice selected HPOG. Referral faxed in to [phone]. ED CM will follow up

## 2017-02-25 NOTE — ED Notes (Signed)
Bed: WA17 Expected date:  Expected time:  Means of arrival:  Comments: Res A 

## 2017-02-25 NOTE — ED Triage Notes (Addendum)
Per EMS, patient is complaining of lethargy, decreased PO input, or generalized weakness x1-2 days. Patient is alert and oriented to baseline. Hx of dementia. Patient was seen for fall a few days ago. Patient is from [facility].

## 2017-02-25 NOTE — ED Notes (Signed)
Bed: RESA Expected date:  Expected time:  Means of arrival:  Comments: EMS/A.M.S. 

## 2017-02-25 NOTE — ED Notes (Signed)
Pt transported to radiology.

## 2017-02-26 DIAGNOSIS — F0150 Vascular dementia without behavioral disturbance: Secondary | ICD-10-CM | POA: Diagnosis not present

## 2017-02-26 DIAGNOSIS — M109 Gout, unspecified: Secondary | ICD-10-CM | POA: Diagnosis not present

## 2017-02-26 DIAGNOSIS — E1159 Type 2 diabetes mellitus with other circulatory complications: Secondary | ICD-10-CM | POA: Diagnosis not present

## 2017-02-26 DIAGNOSIS — S065X0S Traumatic subdural hemorrhage without loss of consciousness, sequela: Secondary | ICD-10-CM | POA: Diagnosis not present

## 2017-02-26 DIAGNOSIS — K449 Diaphragmatic hernia without obstruction or gangrene: Secondary | ICD-10-CM | POA: Diagnosis not present

## 2017-02-26 DIAGNOSIS — J301 Allergic rhinitis due to pollen: Secondary | ICD-10-CM | POA: Diagnosis not present

## 2017-02-26 DIAGNOSIS — I10 Essential (primary) hypertension: Secondary | ICD-10-CM | POA: Diagnosis not present

## 2017-02-26 DIAGNOSIS — I672 Cerebral atherosclerosis: Secondary | ICD-10-CM | POA: Diagnosis not present

## 2017-02-26 DIAGNOSIS — R630 Anorexia: Secondary | ICD-10-CM | POA: Diagnosis not present

## 2017-02-26 DIAGNOSIS — H409 Unspecified glaucoma: Secondary | ICD-10-CM | POA: Diagnosis not present

## 2017-02-26 DIAGNOSIS — E785 Hyperlipidemia, unspecified: Secondary | ICD-10-CM | POA: Diagnosis not present

## 2017-02-27 DIAGNOSIS — S065X0S Traumatic subdural hemorrhage without loss of consciousness, sequela: Secondary | ICD-10-CM | POA: Diagnosis not present

## 2017-02-27 DIAGNOSIS — I10 Essential (primary) hypertension: Secondary | ICD-10-CM | POA: Diagnosis not present

## 2017-02-27 DIAGNOSIS — R630 Anorexia: Secondary | ICD-10-CM | POA: Diagnosis not present

## 2017-02-27 DIAGNOSIS — E1159 Type 2 diabetes mellitus with other circulatory complications: Secondary | ICD-10-CM | POA: Diagnosis not present

## 2017-02-27 DIAGNOSIS — F0150 Vascular dementia without behavioral disturbance: Secondary | ICD-10-CM | POA: Diagnosis not present

## 2017-02-27 DIAGNOSIS — I672 Cerebral atherosclerosis: Secondary | ICD-10-CM | POA: Diagnosis not present

## 2017-03-02 DIAGNOSIS — E1159 Type 2 diabetes mellitus with other circulatory complications: Secondary | ICD-10-CM | POA: Diagnosis not present

## 2017-03-02 DIAGNOSIS — R630 Anorexia: Secondary | ICD-10-CM | POA: Diagnosis not present

## 2017-03-02 DIAGNOSIS — I672 Cerebral atherosclerosis: Secondary | ICD-10-CM | POA: Diagnosis not present

## 2017-03-02 DIAGNOSIS — S065X0S Traumatic subdural hemorrhage without loss of consciousness, sequela: Secondary | ICD-10-CM | POA: Diagnosis not present

## 2017-03-02 DIAGNOSIS — I10 Essential (primary) hypertension: Secondary | ICD-10-CM | POA: Diagnosis not present

## 2017-03-02 DIAGNOSIS — F0150 Vascular dementia without behavioral disturbance: Secondary | ICD-10-CM | POA: Diagnosis not present

## 2017-03-03 DIAGNOSIS — R630 Anorexia: Secondary | ICD-10-CM | POA: Diagnosis not present

## 2017-03-03 DIAGNOSIS — E1159 Type 2 diabetes mellitus with other circulatory complications: Secondary | ICD-10-CM | POA: Diagnosis not present

## 2017-03-03 DIAGNOSIS — S065X0S Traumatic subdural hemorrhage without loss of consciousness, sequela: Secondary | ICD-10-CM | POA: Diagnosis not present

## 2017-03-03 DIAGNOSIS — I10 Essential (primary) hypertension: Secondary | ICD-10-CM | POA: Diagnosis not present

## 2017-03-03 DIAGNOSIS — I672 Cerebral atherosclerosis: Secondary | ICD-10-CM | POA: Diagnosis not present

## 2017-03-03 DIAGNOSIS — F0150 Vascular dementia without behavioral disturbance: Secondary | ICD-10-CM | POA: Diagnosis not present

## 2017-03-05 DIAGNOSIS — E1159 Type 2 diabetes mellitus with other circulatory complications: Secondary | ICD-10-CM | POA: Diagnosis not present

## 2017-03-05 DIAGNOSIS — F0150 Vascular dementia without behavioral disturbance: Secondary | ICD-10-CM | POA: Diagnosis not present

## 2017-03-05 DIAGNOSIS — S065X0S Traumatic subdural hemorrhage without loss of consciousness, sequela: Secondary | ICD-10-CM | POA: Diagnosis not present

## 2017-03-05 DIAGNOSIS — R630 Anorexia: Secondary | ICD-10-CM | POA: Diagnosis not present

## 2017-03-05 DIAGNOSIS — I672 Cerebral atherosclerosis: Secondary | ICD-10-CM | POA: Diagnosis not present

## 2017-03-05 DIAGNOSIS — I10 Essential (primary) hypertension: Secondary | ICD-10-CM | POA: Diagnosis not present

## 2017-03-06 DIAGNOSIS — R630 Anorexia: Secondary | ICD-10-CM | POA: Diagnosis not present

## 2017-03-06 DIAGNOSIS — F0150 Vascular dementia without behavioral disturbance: Secondary | ICD-10-CM | POA: Diagnosis not present

## 2017-03-06 DIAGNOSIS — I10 Essential (primary) hypertension: Secondary | ICD-10-CM | POA: Diagnosis not present

## 2017-03-06 DIAGNOSIS — I672 Cerebral atherosclerosis: Secondary | ICD-10-CM | POA: Diagnosis not present

## 2017-03-06 DIAGNOSIS — E1159 Type 2 diabetes mellitus with other circulatory complications: Secondary | ICD-10-CM | POA: Diagnosis not present

## 2017-03-06 DIAGNOSIS — S065X0S Traumatic subdural hemorrhage without loss of consciousness, sequela: Secondary | ICD-10-CM | POA: Diagnosis not present

## 2017-03-09 DIAGNOSIS — S065X9D Traumatic subdural hemorrhage with loss of consciousness of unspecified duration, subsequent encounter: Secondary | ICD-10-CM | POA: Diagnosis not present

## 2017-03-09 DIAGNOSIS — F0390 Unspecified dementia without behavioral disturbance: Secondary | ICD-10-CM | POA: Diagnosis not present

## 2017-03-09 DIAGNOSIS — S065X9A Traumatic subdural hemorrhage with loss of consciousness of unspecified duration, initial encounter: Secondary | ICD-10-CM | POA: Diagnosis not present

## 2017-03-09 DIAGNOSIS — R39198 Other difficulties with micturition: Secondary | ICD-10-CM | POA: Diagnosis not present

## 2017-03-10 ENCOUNTER — Other Ambulatory Visit: Payer: Self-pay | Admitting: *Deleted

## 2017-03-10 ENCOUNTER — Other Ambulatory Visit: Payer: Self-pay | Admitting: Licensed Clinical Social Worker

## 2017-03-10 DIAGNOSIS — S065X0S Traumatic subdural hemorrhage without loss of consciousness, sequela: Secondary | ICD-10-CM | POA: Diagnosis not present

## 2017-03-10 DIAGNOSIS — I10 Essential (primary) hypertension: Secondary | ICD-10-CM | POA: Diagnosis not present

## 2017-03-10 DIAGNOSIS — E1159 Type 2 diabetes mellitus with other circulatory complications: Secondary | ICD-10-CM | POA: Diagnosis not present

## 2017-03-10 DIAGNOSIS — F0150 Vascular dementia without behavioral disturbance: Secondary | ICD-10-CM | POA: Diagnosis not present

## 2017-03-10 DIAGNOSIS — R630 Anorexia: Secondary | ICD-10-CM | POA: Diagnosis not present

## 2017-03-10 DIAGNOSIS — I672 Cerebral atherosclerosis: Secondary | ICD-10-CM | POA: Diagnosis not present

## 2017-03-10 NOTE — Patient Outreach (Signed)
[facility]) Care Management  03/10/2017  Tanya Smith 16-Jul-1930 [id number]  Assessment- CSW received new referral on patient. CSW has previously been involved with case. Patient's daughter is requesting assistance with placement for her mother with dementia from her current facility [place] to another facility. Family is not happy with facility and wishes for patient to be moved to another facility that is more appropriate. FL2 has been completed by Dr. Laurann Montana and has been sent to [facility] but family has not heard back. Patient's daughter is willing to pay the difference for patient to have a private room at a facility as well.  CSW completed call to [employer] GNP-BC and discussed case. Patient is a hospice patient and [facility] CSW is unable to open case but is able to at least fax FL2 to facilities, provide education to family and coordinate care as needed.  CSW completed call to patient's daughter and she answered. HIPPA verifications were received. CSW introduced self and reason for call. Daughter is understanding that CSW cannot open case due to patient being active with Hospice but that CSW will still be able to provide some assistance with placement. Patient has a Education officer, museum with Hospice and CSW encouraged family to use this resource to assist with placement as well. She reports that she has talked to Hospice social worker about placement but will do so again. Daughter reports that she has contacted [facility] but has not heard back. CSW also provided [facility] contact information which offers a free local advisor to assist with placement. CSW also provided information on [employer] (Zollie Scale) and [facility] Libby Maw) who both assist with placement. CSW completed call to [facility] and left message with Admission Coordinator to contact daughter back as soon as possible and provide information on whether they are able to accept  patient or not. Daughter wishes that [place] fax FL2 to 9 facilities. Patient reports that she does not wish for CSW to fax FL2 to [employer] [facility], [place] or [facility] because she has completed a tour of facilities and was not impressed. CSW encouraged her to complete a tour of the other facilities that she is interested in as well. CSW will mail family a list of contact phone numbers of all of the facilities that CSW faxes FL2 to in order for family to appropriately follow up as needed. Daughter was extremely appreciative of social work assistance.   Plan-CSW will fax FL2 to desired facilities within 48 hours.  Eula Fried, BSW, MSW, [email or web address] Phone: [phone] Fax: [phone]

## 2017-03-10 NOTE — Patient Outreach (Signed)
Mrs. Tanya Smith, daughter of Tanya Smith, called me today to request assistance with placement of her mother with dementia from her current facility [place] to another facility. (Pt has been at [facility] for 6 years. Daughter is not happy with the facility, not enough supervision and doesn't feel like it is clean) An FL2 has been filled out by Dr. Laurann Montana in this month and sent to [facility] and they are supposedly considering this pt although Mrs. Tanya Smith has not heard back from them. The pt is Medicare/Medicaid, but Mrs. Tanya Smith is willing to pay the difference for pt to have a private room).  Called Dr. Delene Ruffini office to request a copy of the FL2 form. I left a message for Tanya Smith regarding the above.  I have initiated an order for Social Work to assist with this placement.  Tanya Smith [facility] [phone]

## 2017-03-11 ENCOUNTER — Other Ambulatory Visit: Payer: Self-pay | Admitting: Licensed Clinical Social Worker

## 2017-03-11 DIAGNOSIS — E1159 Type 2 diabetes mellitus with other circulatory complications: Secondary | ICD-10-CM | POA: Diagnosis not present

## 2017-03-11 DIAGNOSIS — I672 Cerebral atherosclerosis: Secondary | ICD-10-CM | POA: Diagnosis not present

## 2017-03-11 DIAGNOSIS — S065X0S Traumatic subdural hemorrhage without loss of consciousness, sequela: Secondary | ICD-10-CM | POA: Diagnosis not present

## 2017-03-11 DIAGNOSIS — F0150 Vascular dementia without behavioral disturbance: Secondary | ICD-10-CM | POA: Diagnosis not present

## 2017-03-11 DIAGNOSIS — R630 Anorexia: Secondary | ICD-10-CM | POA: Diagnosis not present

## 2017-03-11 DIAGNOSIS — I10 Essential (primary) hypertension: Secondary | ICD-10-CM | POA: Diagnosis not present

## 2017-03-11 NOTE — Patient Outreach (Signed)
[facility]) Care Management  03/11/2017  Tanya Smith [birth year] [id number]   Request received from Eula Fried to send patient a list of LTC facilities.  This resource was sent 03/11/17.

## 2017-03-11 NOTE — Patient Outreach (Signed)
[facility]) Care Management  03/11/2017  Tanya Smith 03-16-1930 [id number]  Assessment- CSW spoke with [facility] NP Deloria Lair in regards to case. CSW successfully faxed FL2 to 9 different facilities and encouraged admissions to contact family to assist with LTC placement if they are willing to accept patient. CSW also mailed out LTC placement facility list with their contact information as well as LTC placement resources to family for them to hold on to. CSW will not open case at this time as patient is a Hospice patient.  [facility] will not open case at this time.   Eula Fried, BSW, MSW, [email or web address] Phone: [phone] Fax: [phone]

## 2017-03-12 DIAGNOSIS — F0150 Vascular dementia without behavioral disturbance: Secondary | ICD-10-CM | POA: Diagnosis not present

## 2017-03-12 DIAGNOSIS — S065X0S Traumatic subdural hemorrhage without loss of consciousness, sequela: Secondary | ICD-10-CM | POA: Diagnosis not present

## 2017-03-12 DIAGNOSIS — E1159 Type 2 diabetes mellitus with other circulatory complications: Secondary | ICD-10-CM | POA: Diagnosis not present

## 2017-03-12 DIAGNOSIS — I672 Cerebral atherosclerosis: Secondary | ICD-10-CM | POA: Diagnosis not present

## 2017-03-12 DIAGNOSIS — I10 Essential (primary) hypertension: Secondary | ICD-10-CM | POA: Diagnosis not present

## 2017-03-12 DIAGNOSIS — R630 Anorexia: Secondary | ICD-10-CM | POA: Diagnosis not present

## 2017-03-13 DIAGNOSIS — R630 Anorexia: Secondary | ICD-10-CM | POA: Diagnosis not present

## 2017-03-13 DIAGNOSIS — E1159 Type 2 diabetes mellitus with other circulatory complications: Secondary | ICD-10-CM | POA: Diagnosis not present

## 2017-03-13 DIAGNOSIS — I10 Essential (primary) hypertension: Secondary | ICD-10-CM | POA: Diagnosis not present

## 2017-03-13 DIAGNOSIS — I672 Cerebral atherosclerosis: Secondary | ICD-10-CM | POA: Diagnosis not present

## 2017-03-13 DIAGNOSIS — F0150 Vascular dementia without behavioral disturbance: Secondary | ICD-10-CM | POA: Diagnosis not present

## 2017-03-13 DIAGNOSIS — S065X0S Traumatic subdural hemorrhage without loss of consciousness, sequela: Secondary | ICD-10-CM | POA: Diagnosis not present

## 2017-03-16 DIAGNOSIS — I10 Essential (primary) hypertension: Secondary | ICD-10-CM | POA: Diagnosis not present

## 2017-03-16 DIAGNOSIS — E1159 Type 2 diabetes mellitus with other circulatory complications: Secondary | ICD-10-CM | POA: Diagnosis not present

## 2017-03-16 DIAGNOSIS — S065X0S Traumatic subdural hemorrhage without loss of consciousness, sequela: Secondary | ICD-10-CM | POA: Diagnosis not present

## 2017-03-16 DIAGNOSIS — F0150 Vascular dementia without behavioral disturbance: Secondary | ICD-10-CM | POA: Diagnosis not present

## 2017-03-16 DIAGNOSIS — I672 Cerebral atherosclerosis: Secondary | ICD-10-CM | POA: Diagnosis not present

## 2017-03-16 DIAGNOSIS — R630 Anorexia: Secondary | ICD-10-CM | POA: Diagnosis not present

## 2017-03-18 DIAGNOSIS — F0150 Vascular dementia without behavioral disturbance: Secondary | ICD-10-CM | POA: Diagnosis not present

## 2017-03-18 DIAGNOSIS — R630 Anorexia: Secondary | ICD-10-CM | POA: Diagnosis not present

## 2017-03-18 DIAGNOSIS — S065X0S Traumatic subdural hemorrhage without loss of consciousness, sequela: Secondary | ICD-10-CM | POA: Diagnosis not present

## 2017-03-18 DIAGNOSIS — I672 Cerebral atherosclerosis: Secondary | ICD-10-CM | POA: Diagnosis not present

## 2017-03-18 DIAGNOSIS — E1159 Type 2 diabetes mellitus with other circulatory complications: Secondary | ICD-10-CM | POA: Diagnosis not present

## 2017-03-18 DIAGNOSIS — I10 Essential (primary) hypertension: Secondary | ICD-10-CM | POA: Diagnosis not present

## 2017-03-20 DIAGNOSIS — R630 Anorexia: Secondary | ICD-10-CM | POA: Diagnosis not present

## 2017-03-20 DIAGNOSIS — F0150 Vascular dementia without behavioral disturbance: Secondary | ICD-10-CM | POA: Diagnosis not present

## 2017-03-20 DIAGNOSIS — E1159 Type 2 diabetes mellitus with other circulatory complications: Secondary | ICD-10-CM | POA: Diagnosis not present

## 2017-03-20 DIAGNOSIS — I672 Cerebral atherosclerosis: Secondary | ICD-10-CM | POA: Diagnosis not present

## 2017-03-20 DIAGNOSIS — S065X0S Traumatic subdural hemorrhage without loss of consciousness, sequela: Secondary | ICD-10-CM | POA: Diagnosis not present

## 2017-03-20 DIAGNOSIS — I10 Essential (primary) hypertension: Secondary | ICD-10-CM | POA: Diagnosis not present

## 2017-03-25 DIAGNOSIS — F0150 Vascular dementia without behavioral disturbance: Secondary | ICD-10-CM | POA: Diagnosis not present

## 2017-03-25 DIAGNOSIS — I672 Cerebral atherosclerosis: Secondary | ICD-10-CM | POA: Diagnosis not present

## 2017-03-25 DIAGNOSIS — I10 Essential (primary) hypertension: Secondary | ICD-10-CM | POA: Diagnosis not present

## 2017-03-25 DIAGNOSIS — R630 Anorexia: Secondary | ICD-10-CM | POA: Diagnosis not present

## 2017-03-25 DIAGNOSIS — S065X0S Traumatic subdural hemorrhage without loss of consciousness, sequela: Secondary | ICD-10-CM | POA: Diagnosis not present

## 2017-03-25 DIAGNOSIS — E1159 Type 2 diabetes mellitus with other circulatory complications: Secondary | ICD-10-CM | POA: Diagnosis not present

## 2017-03-27 DIAGNOSIS — S065X0S Traumatic subdural hemorrhage without loss of consciousness, sequela: Secondary | ICD-10-CM | POA: Diagnosis not present

## 2017-03-27 DIAGNOSIS — E785 Hyperlipidemia, unspecified: Secondary | ICD-10-CM | POA: Diagnosis not present

## 2017-03-27 DIAGNOSIS — J301 Allergic rhinitis due to pollen: Secondary | ICD-10-CM | POA: Diagnosis not present

## 2017-03-27 DIAGNOSIS — K449 Diaphragmatic hernia without obstruction or gangrene: Secondary | ICD-10-CM | POA: Diagnosis not present

## 2017-03-27 DIAGNOSIS — H409 Unspecified glaucoma: Secondary | ICD-10-CM | POA: Diagnosis not present

## 2017-03-27 DIAGNOSIS — M109 Gout, unspecified: Secondary | ICD-10-CM | POA: Diagnosis not present

## 2017-03-27 DIAGNOSIS — F0150 Vascular dementia without behavioral disturbance: Secondary | ICD-10-CM | POA: Diagnosis not present

## 2017-03-27 DIAGNOSIS — E1159 Type 2 diabetes mellitus with other circulatory complications: Secondary | ICD-10-CM | POA: Diagnosis not present

## 2017-03-27 DIAGNOSIS — R630 Anorexia: Secondary | ICD-10-CM | POA: Diagnosis not present

## 2017-03-27 DIAGNOSIS — I672 Cerebral atherosclerosis: Secondary | ICD-10-CM | POA: Diagnosis not present

## 2017-03-27 DIAGNOSIS — I10 Essential (primary) hypertension: Secondary | ICD-10-CM | POA: Diagnosis not present

## 2017-03-31 DIAGNOSIS — E1159 Type 2 diabetes mellitus with other circulatory complications: Secondary | ICD-10-CM | POA: Diagnosis not present

## 2017-03-31 DIAGNOSIS — I672 Cerebral atherosclerosis: Secondary | ICD-10-CM | POA: Diagnosis not present

## 2017-03-31 DIAGNOSIS — R630 Anorexia: Secondary | ICD-10-CM | POA: Diagnosis not present

## 2017-03-31 DIAGNOSIS — F0150 Vascular dementia without behavioral disturbance: Secondary | ICD-10-CM | POA: Diagnosis not present

## 2017-03-31 DIAGNOSIS — S065X0S Traumatic subdural hemorrhage without loss of consciousness, sequela: Secondary | ICD-10-CM | POA: Diagnosis not present

## 2017-03-31 DIAGNOSIS — I10 Essential (primary) hypertension: Secondary | ICD-10-CM | POA: Diagnosis not present

## 2017-04-01 DIAGNOSIS — S065X0S Traumatic subdural hemorrhage without loss of consciousness, sequela: Secondary | ICD-10-CM | POA: Diagnosis not present

## 2017-04-01 DIAGNOSIS — I672 Cerebral atherosclerosis: Secondary | ICD-10-CM | POA: Diagnosis not present

## 2017-04-01 DIAGNOSIS — F0150 Vascular dementia without behavioral disturbance: Secondary | ICD-10-CM | POA: Diagnosis not present

## 2017-04-01 DIAGNOSIS — I10 Essential (primary) hypertension: Secondary | ICD-10-CM | POA: Diagnosis not present

## 2017-04-01 DIAGNOSIS — E1159 Type 2 diabetes mellitus with other circulatory complications: Secondary | ICD-10-CM | POA: Diagnosis not present

## 2017-04-01 DIAGNOSIS — R630 Anorexia: Secondary | ICD-10-CM | POA: Diagnosis not present

## 2017-04-03 DIAGNOSIS — E1159 Type 2 diabetes mellitus with other circulatory complications: Secondary | ICD-10-CM | POA: Diagnosis not present

## 2017-04-03 DIAGNOSIS — I10 Essential (primary) hypertension: Secondary | ICD-10-CM | POA: Diagnosis not present

## 2017-04-03 DIAGNOSIS — S065X0S Traumatic subdural hemorrhage without loss of consciousness, sequela: Secondary | ICD-10-CM | POA: Diagnosis not present

## 2017-04-03 DIAGNOSIS — F0150 Vascular dementia without behavioral disturbance: Secondary | ICD-10-CM | POA: Diagnosis not present

## 2017-04-03 DIAGNOSIS — R630 Anorexia: Secondary | ICD-10-CM | POA: Diagnosis not present

## 2017-04-03 DIAGNOSIS — I672 Cerebral atherosclerosis: Secondary | ICD-10-CM | POA: Diagnosis not present

## 2017-04-06 DIAGNOSIS — S065X0S Traumatic subdural hemorrhage without loss of consciousness, sequela: Secondary | ICD-10-CM | POA: Diagnosis not present

## 2017-04-06 DIAGNOSIS — F0150 Vascular dementia without behavioral disturbance: Secondary | ICD-10-CM | POA: Diagnosis not present

## 2017-04-06 DIAGNOSIS — R630 Anorexia: Secondary | ICD-10-CM | POA: Diagnosis not present

## 2017-04-06 DIAGNOSIS — I672 Cerebral atherosclerosis: Secondary | ICD-10-CM | POA: Diagnosis not present

## 2017-04-06 DIAGNOSIS — I10 Essential (primary) hypertension: Secondary | ICD-10-CM | POA: Diagnosis not present

## 2017-04-06 DIAGNOSIS — E1159 Type 2 diabetes mellitus with other circulatory complications: Secondary | ICD-10-CM | POA: Diagnosis not present

## 2017-04-10 DIAGNOSIS — R630 Anorexia: Secondary | ICD-10-CM | POA: Diagnosis not present

## 2017-04-10 DIAGNOSIS — I672 Cerebral atherosclerosis: Secondary | ICD-10-CM | POA: Diagnosis not present

## 2017-04-10 DIAGNOSIS — F0150 Vascular dementia without behavioral disturbance: Secondary | ICD-10-CM | POA: Diagnosis not present

## 2017-04-10 DIAGNOSIS — S065X0S Traumatic subdural hemorrhage without loss of consciousness, sequela: Secondary | ICD-10-CM | POA: Diagnosis not present

## 2017-04-10 DIAGNOSIS — I10 Essential (primary) hypertension: Secondary | ICD-10-CM | POA: Diagnosis not present

## 2017-04-10 DIAGNOSIS — E1159 Type 2 diabetes mellitus with other circulatory complications: Secondary | ICD-10-CM | POA: Diagnosis not present

## 2017-04-14 ENCOUNTER — Encounter: Payer: Self-pay | Admitting: Podiatry

## 2017-04-14 ENCOUNTER — Ambulatory Visit (INDEPENDENT_AMBULATORY_CARE_PROVIDER_SITE_OTHER): Payer: Medicare Other | Admitting: Podiatry

## 2017-04-14 DIAGNOSIS — B351 Tinea unguium: Secondary | ICD-10-CM | POA: Diagnosis not present

## 2017-04-14 DIAGNOSIS — M79676 Pain in unspecified toe(s): Secondary | ICD-10-CM

## 2017-04-14 NOTE — Progress Notes (Signed)
Complaint:  Visit Type: Patient returns to my office for continued preventative foot care services. Complaint: Patient states" my second toe left foot is painful and possibly infected.  She says the toe is painful wearing her shoes and putting on her socks.  She denies trauma or injury.  Her nail is thickand painful under her cuticle.  She presents for evaluation and treatment.  Podiatric Exam: Vascular: dorsalis pedis and posterior tibial pulses are non  palpable bilateral. Capillary return is immediate. Cold feet noted.. Skin turgor WNL  Sensorium: Diminished  Semmes Weinstein monofilament test. Normal tactile sensation bilaterally. Nail Exam: Pt has thick disfigured discolored nails with subungual debris noted bilateral entire nail hallux through fifth toenails.  Her second toe is thick and disfigured and unattached at the distal nail bed.  Her toe pivots on walking. Ulcer Exam: There is no evidence of ulcer or pre-ulcerative changes or infection. Orthopedic Exam: Muscle tone and strength are WNL. No limitations in general ROM. No crepitus or effusions noted. Foot type and digits show no abnormalities. Bony prominences are unremarkable. Skin: No Porokeratosis. No infection or ulcers  Diagnosis:  Onychomycosis, , Pain in right toe, pain in left toes  Treatment & Plan Procedures and Treatment: Consent by patient was obtained for treatment procedures. The patient understood the discussion of treatment and procedures well. All questions were answered thoroughly reviewed. Debridement of mycotic and hypertrophic toenails, 1 through 5 bilateral and clearing of subungual debris. No ulceration, no infection noted.  Return Visit-Office Procedure: Patient instructed to return to the office for a follow up visit 3 months for continued evaluation and treatment.    Gardiner Barefoot DPM

## 2017-04-15 DIAGNOSIS — F0150 Vascular dementia without behavioral disturbance: Secondary | ICD-10-CM | POA: Diagnosis not present

## 2017-04-15 DIAGNOSIS — S065X0S Traumatic subdural hemorrhage without loss of consciousness, sequela: Secondary | ICD-10-CM | POA: Diagnosis not present

## 2017-04-15 DIAGNOSIS — R630 Anorexia: Secondary | ICD-10-CM | POA: Diagnosis not present

## 2017-04-15 DIAGNOSIS — E1159 Type 2 diabetes mellitus with other circulatory complications: Secondary | ICD-10-CM | POA: Diagnosis not present

## 2017-04-15 DIAGNOSIS — I10 Essential (primary) hypertension: Secondary | ICD-10-CM | POA: Diagnosis not present

## 2017-04-15 DIAGNOSIS — I672 Cerebral atherosclerosis: Secondary | ICD-10-CM | POA: Diagnosis not present

## 2017-04-16 DIAGNOSIS — F0150 Vascular dementia without behavioral disturbance: Secondary | ICD-10-CM | POA: Diagnosis not present

## 2017-04-16 DIAGNOSIS — S065X0S Traumatic subdural hemorrhage without loss of consciousness, sequela: Secondary | ICD-10-CM | POA: Diagnosis not present

## 2017-04-16 DIAGNOSIS — R630 Anorexia: Secondary | ICD-10-CM | POA: Diagnosis not present

## 2017-04-16 DIAGNOSIS — I672 Cerebral atherosclerosis: Secondary | ICD-10-CM | POA: Diagnosis not present

## 2017-04-16 DIAGNOSIS — E1159 Type 2 diabetes mellitus with other circulatory complications: Secondary | ICD-10-CM | POA: Diagnosis not present

## 2017-04-16 DIAGNOSIS — I10 Essential (primary) hypertension: Secondary | ICD-10-CM | POA: Diagnosis not present

## 2017-04-17 DIAGNOSIS — F0150 Vascular dementia without behavioral disturbance: Secondary | ICD-10-CM | POA: Diagnosis not present

## 2017-04-17 DIAGNOSIS — I672 Cerebral atherosclerosis: Secondary | ICD-10-CM | POA: Diagnosis not present

## 2017-04-17 DIAGNOSIS — I10 Essential (primary) hypertension: Secondary | ICD-10-CM | POA: Diagnosis not present

## 2017-04-17 DIAGNOSIS — R630 Anorexia: Secondary | ICD-10-CM | POA: Diagnosis not present

## 2017-04-17 DIAGNOSIS — E1159 Type 2 diabetes mellitus with other circulatory complications: Secondary | ICD-10-CM | POA: Diagnosis not present

## 2017-04-17 DIAGNOSIS — S065X0S Traumatic subdural hemorrhage without loss of consciousness, sequela: Secondary | ICD-10-CM | POA: Diagnosis not present

## 2017-04-20 DIAGNOSIS — R630 Anorexia: Secondary | ICD-10-CM | POA: Diagnosis not present

## 2017-04-20 DIAGNOSIS — F0150 Vascular dementia without behavioral disturbance: Secondary | ICD-10-CM | POA: Diagnosis not present

## 2017-04-20 DIAGNOSIS — E1159 Type 2 diabetes mellitus with other circulatory complications: Secondary | ICD-10-CM | POA: Diagnosis not present

## 2017-04-20 DIAGNOSIS — I672 Cerebral atherosclerosis: Secondary | ICD-10-CM | POA: Diagnosis not present

## 2017-04-20 DIAGNOSIS — S065X0S Traumatic subdural hemorrhage without loss of consciousness, sequela: Secondary | ICD-10-CM | POA: Diagnosis not present

## 2017-04-20 DIAGNOSIS — I10 Essential (primary) hypertension: Secondary | ICD-10-CM | POA: Diagnosis not present

## 2017-07-15 ENCOUNTER — Ambulatory Visit: Payer: Medicare Other | Admitting: Podiatry

## 2017-08-04 DIAGNOSIS — F0390 Unspecified dementia without behavioral disturbance: Secondary | ICD-10-CM | POA: Diagnosis not present

## 2017-08-04 DIAGNOSIS — I5030 Unspecified diastolic (congestive) heart failure: Secondary | ICD-10-CM | POA: Diagnosis not present

## 2017-08-04 DIAGNOSIS — N183 Chronic kidney disease, stage 3 (moderate): Secondary | ICD-10-CM | POA: Diagnosis not present

## 2017-08-04 DIAGNOSIS — Z23 Encounter for immunization: Secondary | ICD-10-CM | POA: Diagnosis not present

## 2017-08-18 ENCOUNTER — Emergency Department (HOSPITAL_COMMUNITY): Payer: Medicare Other

## 2017-08-18 ENCOUNTER — Encounter (HOSPITAL_COMMUNITY): Payer: Self-pay

## 2017-08-18 ENCOUNTER — Emergency Department (HOSPITAL_COMMUNITY)
Admission: EM | Admit: 2017-08-18 | Discharge: 2017-08-18 | Disposition: A | Discharge status: Home / Self Care | Payer: Medicare Other | Attending: Emergency Medicine | Admitting: Emergency Medicine

## 2017-08-18 DIAGNOSIS — R072 Precordial pain: Secondary | ICD-10-CM | POA: Insufficient documentation

## 2017-08-18 DIAGNOSIS — N390 Urinary tract infection, site not specified: Secondary | ICD-10-CM | POA: Insufficient documentation

## 2017-08-18 DIAGNOSIS — N189 Chronic kidney disease, unspecified: Secondary | ICD-10-CM | POA: Insufficient documentation

## 2017-08-18 DIAGNOSIS — D649 Anemia, unspecified: Secondary | ICD-10-CM | POA: Insufficient documentation

## 2017-08-18 DIAGNOSIS — E1122 Type 2 diabetes mellitus with diabetic chronic kidney disease: Secondary | ICD-10-CM | POA: Diagnosis not present

## 2017-08-18 DIAGNOSIS — G308 Other Alzheimer's disease: Secondary | ICD-10-CM | POA: Insufficient documentation

## 2017-08-18 DIAGNOSIS — Z79899 Other long term (current) drug therapy: Secondary | ICD-10-CM | POA: Diagnosis not present

## 2017-08-18 DIAGNOSIS — I5040 Unspecified combined systolic (congestive) and diastolic (congestive) heart failure: Secondary | ICD-10-CM | POA: Diagnosis not present

## 2017-08-18 DIAGNOSIS — I10 Essential (primary) hypertension: Secondary | ICD-10-CM | POA: Diagnosis not present

## 2017-08-18 DIAGNOSIS — F0281 Dementia in other diseases classified elsewhere with behavioral disturbance: Secondary | ICD-10-CM | POA: Diagnosis not present

## 2017-08-18 DIAGNOSIS — I130 Hypertensive heart and chronic kidney disease with heart failure and stage 1 through stage 4 chronic kidney disease, or unspecified chronic kidney disease: Secondary | ICD-10-CM | POA: Diagnosis not present

## 2017-08-18 DIAGNOSIS — Z91040 Latex allergy status: Secondary | ICD-10-CM | POA: Insufficient documentation

## 2017-08-18 DIAGNOSIS — Z87891 Personal history of nicotine dependence: Secondary | ICD-10-CM | POA: Insufficient documentation

## 2017-08-18 DIAGNOSIS — R079 Chest pain, unspecified: Secondary | ICD-10-CM | POA: Diagnosis not present

## 2017-08-18 DIAGNOSIS — R4182 Altered mental status, unspecified: Secondary | ICD-10-CM | POA: Diagnosis not present

## 2017-08-18 DIAGNOSIS — I509 Heart failure, unspecified: Secondary | ICD-10-CM | POA: Diagnosis not present

## 2017-08-18 HISTORY — DX: Dementia in other diseases classified elsewhere, unspecified severity, without behavioral disturbance, psychotic disturbance, mood disturbance, and anxiety: F02.80

## 2017-08-18 HISTORY — DX: Alzheimer's disease, unspecified: G30.9

## 2017-08-18 LAB — BASIC METABOLIC PANEL
Anion gap: 8 (ref 5–15)
BUN: 33 mg/dL — ABNORMAL HIGH (ref 6–20)
CALCIUM: 8.9 mg/dL (ref 8.9–10.3)
CHLORIDE: 106 mmol/L (ref 101–111)
CO2: 24 mmol/L (ref 22–32)
Creatinine, Ser: 1.92 mg/dL — ABNORMAL HIGH (ref 0.44–1.00)
GFR calc non Af Amer: 23 mL/min — ABNORMAL LOW (ref >60)
GFR, EST AFRICAN AMERICAN: 26 mL/min — AB (ref >60)
Glucose, Bld: 88 mg/dL (ref 65–99)
Potassium: 4.2 mmol/L (ref 3.5–5.1)
SODIUM: 138 mmol/L (ref 135–145)

## 2017-08-18 LAB — URINALYSIS, ROUTINE W REFLEX MICROSCOPIC
Bilirubin Urine: NEGATIVE
GLUCOSE, UA: NEGATIVE mg/dL
Hgb urine dipstick: NEGATIVE
Ketones, ur: NEGATIVE mg/dL
Nitrite: NEGATIVE
PH: 6 (ref 5.0–8.0)
Protein, ur: 100 mg/dL — AB
SPECIFIC GRAVITY, URINE: 1.011 (ref 1.005–1.030)

## 2017-08-18 LAB — BRAIN NATRIURETIC PEPTIDE: B NATRIURETIC PEPTIDE 5: 314.2 pg/mL — AB (ref 0.0–100.0)

## 2017-08-18 LAB — CBC
HCT: 34.4 % — ABNORMAL LOW (ref 36.0–46.0)
HEMOGLOBIN: 11.2 g/dL — AB (ref 12.0–15.0)
MCH: 28.6 pg (ref 26.0–34.0)
MCHC: 32.6 g/dL (ref 30.0–36.0)
MCV: 88 fL (ref 78.0–100.0)
Platelets: 206 10*3/uL (ref 150–400)
RBC: 3.91 MIL/uL (ref 3.87–5.11)
RDW: 14.6 % (ref 11.5–15.5)
WBC: 5.3 10*3/uL (ref 4.0–10.5)

## 2017-08-18 LAB — TROPONIN I

## 2017-08-18 LAB — I-STAT TROPONIN, ED: TROPONIN I, POC: 0 ng/mL (ref 0.00–0.08)

## 2017-08-18 MED ORDER — HYDROCHLOROTHIAZIDE 12.5 MG PO TABS: 12.5000 mg | ORAL_TABLET | Freq: Every day | ORAL | 0 refills | Status: DC

## 2017-08-18 MED ORDER — HYDROCHLOROTHIAZIDE 12.5 MG PO CAPS
12.5000 mg | ORAL_CAPSULE | ORAL | Status: AC
  Administered 2017-08-18: 12.5 mg via ORAL
  Filled 2017-08-18: qty 1

## 2017-08-18 MED ORDER — FUROSEMIDE 20 MG PO TABS: 20.0000 mg | ORAL_TABLET | Freq: Every day | ORAL | 0 refills | Status: DC

## 2017-08-18 MED ORDER — FUROSEMIDE 10 MG/ML IJ SOLN
20.0000 mg | Freq: Once | INTRAMUSCULAR | Status: AC
  Administered 2017-08-18: 20 mg via INTRAVENOUS
  Filled 2017-08-18: qty 2

## 2017-08-18 MED ORDER — TRAZODONE HCL 50 MG PO TABS
25.0000 mg | ORAL_TABLET | Freq: Once | ORAL | Status: AC
  Administered 2017-08-18: 25 mg via ORAL
  Filled 2017-08-18: qty 1

## 2017-08-18 MED ORDER — FLUCONAZOLE 150 MG PO TABS: ORAL_TABLET | ORAL | 0 refills | Status: AC

## 2017-08-18 MED ORDER — CEPHALEXIN 250 MG PO CAPS: 250.0000 mg | ORAL_CAPSULE | Freq: Three times a day (TID) | ORAL | 0 refills | Status: AC
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
## 2017-08-18 NOTE — Discharge Instructions (Signed)
Use lasix as directed, elevate your legs and use compression stockings to help with swelling. Start taking HCTZ as directed for your high blood pressure, eat a low salt diet. You were found to have a urinary tract infection. Stay very well hydrated with plenty of water throughout the day. Take antibiotic until completed. Take diflucan on the first day of antibiotic use and then again on the last day of the antibiotic. Use tylenol as needed for pain. Avoid spicy foods, fatty foods, or acidic foods, avoid NSAIDs like ibuprofen/aleve on an empty stomach. Use over the counter tums/maalox as needed for additional relief of symptoms. Follow up with your primary care physician in 3-5 days for recheck of ongoing symptoms but return to ER for emergent changing or worsening of symptoms.  Please seek immediate care if you develop the following: You develop back pain.  Your symptoms are no better, or worse in 3 days. There is severe back pain or lower abdominal pain.  You develop chills.  You have a fever.  There is nausea or vomiting.  There is continued burning or discomfort with urination.   SEEK IMMEDIATE MEDICAL ATTENTION IF: You develop a fever.  Your chest pains become severe or intolerable.  You develop new, unexplained symptoms (problems).  You develop shortness of breath, nausea, vomiting, sweating or feel light headed.  You develop a new cough or you cough up blood. You develop new leg swelling

## 2017-08-18 NOTE — ED Notes (Signed)
PureWick applied 

## 2017-08-18 NOTE — ED Notes (Signed)
Pt's family verbalized understanding of d/c instructions and has no further questions. Pt still hypertensive, PA aware. Pt is to start on low dose BP med at facility. Pt's mental status at baseline. Pt to be d/c home with ptar.
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
## 2017-08-18 NOTE — ED Notes (Signed)
Mercedes PA in room to discuss d/c plan for pt.

## 2017-08-20 DIAGNOSIS — F0390 Unspecified dementia without behavioral disturbance: Secondary | ICD-10-CM | POA: Diagnosis not present

## 2017-08-20 DIAGNOSIS — I5030 Unspecified diastolic (congestive) heart failure: Secondary | ICD-10-CM | POA: Diagnosis not present

## 2017-08-20 DIAGNOSIS — N183 Chronic kidney disease, stage 3 (moderate): Secondary | ICD-10-CM | POA: Diagnosis not present

## 2017-08-21 LAB — URINE CULTURE

## 2017-08-28 ENCOUNTER — Encounter (HOSPITAL_COMMUNITY): Payer: Self-pay | Admitting: Emergency Medicine

## 2017-08-28 ENCOUNTER — Emergency Department (HOSPITAL_COMMUNITY)
Admission: EM | Admit: 2017-08-28 | Discharge: 2017-08-29 | Disposition: A | Discharge status: Home / Self Care | Payer: Medicare Other | Attending: Emergency Medicine | Admitting: Emergency Medicine

## 2017-08-28 ENCOUNTER — Emergency Department (HOSPITAL_COMMUNITY): Payer: Medicare Other

## 2017-08-28 DIAGNOSIS — Z91040 Latex allergy status: Secondary | ICD-10-CM | POA: Insufficient documentation

## 2017-08-28 DIAGNOSIS — R739 Hyperglycemia, unspecified: Secondary | ICD-10-CM | POA: Diagnosis present

## 2017-08-28 DIAGNOSIS — N183 Chronic kidney disease, stage 3 unspecified: Secondary | ICD-10-CM

## 2017-08-28 DIAGNOSIS — E876 Hypokalemia: Secondary | ICD-10-CM

## 2017-08-28 DIAGNOSIS — I130 Hypertensive heart and chronic kidney disease with heart failure and stage 1 through stage 4 chronic kidney disease, or unspecified chronic kidney disease: Secondary | ICD-10-CM | POA: Diagnosis not present

## 2017-08-28 DIAGNOSIS — E1122 Type 2 diabetes mellitus with diabetic chronic kidney disease: Secondary | ICD-10-CM | POA: Diagnosis not present

## 2017-08-28 DIAGNOSIS — I5032 Chronic diastolic (congestive) heart failure: Secondary | ICD-10-CM | POA: Diagnosis not present

## 2017-08-28 DIAGNOSIS — G309 Alzheimer's disease, unspecified: Secondary | ICD-10-CM | POA: Diagnosis not present

## 2017-08-28 DIAGNOSIS — I129 Hypertensive chronic kidney disease with stage 1 through stage 4 chronic kidney disease, or unspecified chronic kidney disease: Secondary | ICD-10-CM | POA: Diagnosis not present

## 2017-08-28 DIAGNOSIS — I10 Essential (primary) hypertension: Secondary | ICD-10-CM | POA: Diagnosis not present

## 2017-08-28 DIAGNOSIS — Z79899 Other long term (current) drug therapy: Secondary | ICD-10-CM | POA: Diagnosis not present

## 2017-08-28 DIAGNOSIS — R4182 Altered mental status, unspecified: Secondary | ICD-10-CM | POA: Diagnosis not present

## 2017-08-28 DIAGNOSIS — Z87891 Personal history of nicotine dependence: Secondary | ICD-10-CM | POA: Insufficient documentation

## 2017-08-28 DIAGNOSIS — R030 Elevated blood-pressure reading, without diagnosis of hypertension: Secondary | ICD-10-CM | POA: Diagnosis not present

## 2017-08-28 DIAGNOSIS — R404 Transient alteration of awareness: Secondary | ICD-10-CM | POA: Diagnosis not present

## 2017-08-28 DIAGNOSIS — R079 Chest pain, unspecified: Secondary | ICD-10-CM | POA: Diagnosis not present

## 2017-08-28 LAB — COMPREHENSIVE METABOLIC PANEL
ALT: 12 U/L — AB (ref 14–54)
ANION GAP: 11 (ref 5–15)
AST: 22 U/L (ref 15–41)
Albumin: 3.5 g/dL (ref 3.5–5.0)
Alkaline Phosphatase: 111 U/L (ref 38–126)
BUN: 31 mg/dL — ABNORMAL HIGH (ref 6–20)
CHLORIDE: 105 mmol/L (ref 101–111)
CO2: 24 mmol/L (ref 22–32)
CREATININE: 1.84 mg/dL — AB (ref 0.44–1.00)
Calcium: 9.4 mg/dL (ref 8.9–10.3)
GFR, EST AFRICAN AMERICAN: 28 mL/min — AB (ref >60)
GFR, EST NON AFRICAN AMERICAN: 24 mL/min — AB (ref >60)
Glucose, Bld: 137 mg/dL — ABNORMAL HIGH (ref 65–99)
POTASSIUM: 3.3 mmol/L — AB (ref 3.5–5.1)
SODIUM: 140 mmol/L (ref 135–145)
Total Bilirubin: 0.7 mg/dL (ref 0.3–1.2)
Total Protein: 7.6 g/dL (ref 6.5–8.1)

## 2017-08-28 LAB — CBC
HCT: 38.6 % (ref 36.0–46.0)
Hemoglobin: 12.7 g/dL (ref 12.0–15.0)
MCH: 28.9 pg (ref 26.0–34.0)
MCHC: 32.9 g/dL (ref 30.0–36.0)
MCV: 87.9 fL (ref 78.0–100.0)
PLATELETS: 211 10*3/uL (ref 150–400)
RBC: 4.39 MIL/uL (ref 3.87–5.11)
RDW: 14.2 % (ref 11.5–15.5)
WBC: 7.1 10*3/uL (ref 4.0–10.5)

## 2017-08-28 MED ORDER — SODIUM CHLORIDE 0.9 % IV BOLUS (SEPSIS)
500.0000 mL | Freq: Once | INTRAVENOUS | Status: AC
  Administered 2017-08-28: 500 mL via INTRAVENOUS

## 2017-08-28 MED ORDER — SODIUM CHLORIDE 0.9 % IV BOLUS (SEPSIS): 500.0000 mL | Freq: Once | INTRAVENOUS | Status: DC

## 2017-08-28 MED ORDER — LABETALOL HCL 5 MG/ML IV SOLN
20.0000 mg | Freq: Once | INTRAVENOUS | Status: AC
  Administered 2017-08-28: 20 mg via INTRAVENOUS
  Filled 2017-08-28: qty 4

## 2017-08-28 NOTE — ED Notes (Signed)
Daughter refusing to allow in and out cath.  Requested pure wick.  Placed at this time.  Peri care given prior to placing.
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
## 2017-08-28 NOTE — ED Triage Notes (Signed)
Pt arrives via EMS from [facility] memory care unit due to trouble urinating and not eating well for a few days. Per family, pt is nonverbal at baseline and takes medications through meals. Pt was recently treated for CHF a week ago. Per EMS, CBG 363.

## 2017-08-29 DIAGNOSIS — N183 Chronic kidney disease, stage 3 (moderate): Secondary | ICD-10-CM | POA: Diagnosis not present

## 2017-08-29 DIAGNOSIS — I10 Essential (primary) hypertension: Secondary | ICD-10-CM | POA: Diagnosis not present

## 2017-08-29 DIAGNOSIS — R4182 Altered mental status, unspecified: Secondary | ICD-10-CM | POA: Diagnosis not present

## 2017-08-29 DIAGNOSIS — R627 Adult failure to thrive: Secondary | ICD-10-CM | POA: Diagnosis not present

## 2017-08-29 LAB — URINALYSIS, ROUTINE W REFLEX MICROSCOPIC
BILIRUBIN URINE: NEGATIVE
Bacteria, UA: NONE SEEN
GLUCOSE, UA: NEGATIVE mg/dL
HGB URINE DIPSTICK: NEGATIVE
KETONES UR: NEGATIVE mg/dL
LEUKOCYTES UA: NEGATIVE
NITRITE: NEGATIVE
PH: 6 (ref 5.0–8.0)
Protein, ur: 100 mg/dL — AB
Specific Gravity, Urine: 1.015 (ref 1.005–1.030)

## 2017-08-29 NOTE — Discharge Instructions (Signed)
It was our pleasure to provide your ER care today - we hope that you feel better.  From today's lab results, your potassium level is slightly low (3.3)  - eat plenty of fruits and vegetables, and follow up with primary care doctor in 1 week.  Encourage patient to drink adequate fluids - consider supplementing nutrition with Ensure, Boost, or nutritious shakes.   Follow up with your doctor in the coming week for recheck, and recheck of your blood pressure which is high today.  Return to ER if worse, new symptoms, persistent vomiting, trouble breathing, fevers, other concern.

## 2017-08-29 NOTE — ED Notes (Signed)
Report given to [facility] at this time.  Waiting for transport to arrive.  Discharge instructions given to daughter.  Sending d/c instructions with transport.

## 2017-08-29 NOTE — ED Notes (Signed)
[facility]AR is not available, [facility] will be helping to tx patient back to [facility]

## 2017-08-30 DIAGNOSIS — K5289 Other specified noninfective gastroenteritis and colitis: Secondary | ICD-10-CM | POA: Diagnosis not present

## 2017-08-30 DIAGNOSIS — Z79899 Other long term (current) drug therapy: Secondary | ICD-10-CM | POA: Diagnosis not present

## 2017-08-30 DIAGNOSIS — D649 Anemia, unspecified: Secondary | ICD-10-CM | POA: Diagnosis not present

## 2017-08-30 DIAGNOSIS — K208 Other esophagitis: Secondary | ICD-10-CM | POA: Diagnosis not present

## 2017-08-30 DIAGNOSIS — H409 Unspecified glaucoma: Secondary | ICD-10-CM | POA: Diagnosis not present

## 2017-08-30 DIAGNOSIS — R630 Anorexia: Secondary | ICD-10-CM | POA: Diagnosis not present

## 2017-08-30 DIAGNOSIS — R569 Unspecified convulsions: Secondary | ICD-10-CM | POA: Diagnosis not present

## 2017-08-30 DIAGNOSIS — R140 Abdominal distension (gaseous): Secondary | ICD-10-CM | POA: Diagnosis not present

## 2017-08-30 DIAGNOSIS — Z978 Presence of other specified devices: Secondary | ICD-10-CM | POA: Diagnosis not present

## 2017-08-30 DIAGNOSIS — Z4682 Encounter for fitting and adjustment of non-vascular catheter: Secondary | ICD-10-CM | POA: Diagnosis not present

## 2017-08-30 DIAGNOSIS — E870 Hyperosmolality and hypernatremia: Secondary | ICD-10-CM | POA: Diagnosis not present

## 2017-08-30 DIAGNOSIS — Z781 Physical restraint status: Secondary | ICD-10-CM | POA: Diagnosis not present

## 2017-08-30 DIAGNOSIS — K5909 Other constipation: Secondary | ICD-10-CM | POA: Diagnosis not present

## 2017-08-30 DIAGNOSIS — I5032 Chronic diastolic (congestive) heart failure: Secondary | ICD-10-CM | POA: Diagnosis not present

## 2017-08-30 DIAGNOSIS — K2210 Ulcer of esophagus without bleeding: Secondary | ICD-10-CM | POA: Diagnosis present

## 2017-08-30 DIAGNOSIS — R1310 Dysphagia, unspecified: Secondary | ICD-10-CM | POA: Diagnosis not present

## 2017-08-30 DIAGNOSIS — M109 Gout, unspecified: Secondary | ICD-10-CM | POA: Diagnosis not present

## 2017-08-30 DIAGNOSIS — N183 Chronic kidney disease, stage 3 (moderate): Secondary | ICD-10-CM | POA: Diagnosis not present

## 2017-08-30 DIAGNOSIS — R4020 Unspecified coma: Secondary | ICD-10-CM | POA: Diagnosis not present

## 2017-08-30 DIAGNOSIS — I110 Hypertensive heart disease with heart failure: Secondary | ICD-10-CM | POA: Diagnosis not present

## 2017-08-30 DIAGNOSIS — I9589 Other hypotension: Secondary | ICD-10-CM | POA: Diagnosis not present

## 2017-08-30 DIAGNOSIS — K210 Gastro-esophageal reflux disease with esophagitis: Secondary | ICD-10-CM | POA: Diagnosis not present

## 2017-08-30 DIAGNOSIS — E861 Hypovolemia: Secondary | ICD-10-CM | POA: Diagnosis not present

## 2017-08-30 DIAGNOSIS — F0390 Unspecified dementia without behavioral disturbance: Secondary | ICD-10-CM | POA: Diagnosis present

## 2017-08-30 DIAGNOSIS — K5900 Constipation, unspecified: Secondary | ICD-10-CM | POA: Diagnosis not present

## 2017-08-30 DIAGNOSIS — I129 Hypertensive chronic kidney disease with stage 1 through stage 4 chronic kidney disease, or unspecified chronic kidney disease: Secondary | ICD-10-CM | POA: Diagnosis not present

## 2017-08-30 DIAGNOSIS — K668 Other specified disorders of peritoneum: Secondary | ICD-10-CM | POA: Diagnosis not present

## 2017-08-30 DIAGNOSIS — R54 Age-related physical debility: Secondary | ICD-10-CM | POA: Diagnosis not present

## 2017-08-30 DIAGNOSIS — N189 Chronic kidney disease, unspecified: Secondary | ICD-10-CM | POA: Diagnosis not present

## 2017-08-30 DIAGNOSIS — R451 Restlessness and agitation: Secondary | ICD-10-CM | POA: Diagnosis not present

## 2017-08-30 DIAGNOSIS — F0280 Dementia in other diseases classified elsewhere without behavioral disturbance: Secondary | ICD-10-CM | POA: Diagnosis not present

## 2017-08-30 DIAGNOSIS — Z8673 Personal history of transient ischemic attack (TIA), and cerebral infarction without residual deficits: Secondary | ICD-10-CM | POA: Diagnosis not present

## 2017-08-30 DIAGNOSIS — Z960 Presence of urogenital implants: Secondary | ICD-10-CM | POA: Diagnosis not present

## 2017-08-30 DIAGNOSIS — N179 Acute kidney failure, unspecified: Secondary | ICD-10-CM | POA: Diagnosis not present

## 2017-08-30 DIAGNOSIS — I519 Heart disease, unspecified: Secondary | ICD-10-CM | POA: Diagnosis not present

## 2017-08-30 DIAGNOSIS — K449 Diaphragmatic hernia without obstruction or gangrene: Secondary | ICD-10-CM | POA: Diagnosis present

## 2017-08-30 DIAGNOSIS — R1312 Dysphagia, oropharyngeal phase: Secondary | ICD-10-CM | POA: Diagnosis not present

## 2017-08-30 DIAGNOSIS — G308 Other Alzheimer's disease: Secondary | ICD-10-CM | POA: Diagnosis not present

## 2017-08-30 DIAGNOSIS — R402441 Other coma, without documented Glasgow coma scale score, or with partial score reported, in the field [EMT or ambulance]: Secondary | ICD-10-CM | POA: Diagnosis not present

## 2017-08-30 DIAGNOSIS — K5641 Fecal impaction: Secondary | ICD-10-CM | POA: Diagnosis present

## 2017-08-30 DIAGNOSIS — K31819 Angiodysplasia of stomach and duodenum without bleeding: Secondary | ICD-10-CM | POA: Diagnosis not present

## 2017-08-30 DIAGNOSIS — K5901 Slow transit constipation: Secondary | ICD-10-CM | POA: Diagnosis not present

## 2017-08-30 DIAGNOSIS — W19XXXA Unspecified fall, initial encounter: Secondary | ICD-10-CM | POA: Diagnosis not present

## 2017-08-30 DIAGNOSIS — E871 Hypo-osmolality and hyponatremia: Secondary | ICD-10-CM | POA: Diagnosis not present

## 2017-08-30 DIAGNOSIS — E785 Hyperlipidemia, unspecified: Secondary | ICD-10-CM | POA: Diagnosis present

## 2017-08-30 DIAGNOSIS — I509 Heart failure, unspecified: Secondary | ICD-10-CM | POA: Diagnosis not present

## 2017-08-30 DIAGNOSIS — T8089XA Other complications following infusion, transfusion and therapeutic injection, initial encounter: Secondary | ICD-10-CM | POA: Diagnosis not present

## 2017-08-30 DIAGNOSIS — R079 Chest pain, unspecified: Secondary | ICD-10-CM | POA: Diagnosis not present

## 2017-08-30 DIAGNOSIS — R918 Other nonspecific abnormal finding of lung field: Secondary | ICD-10-CM | POA: Diagnosis not present

## 2017-08-30 DIAGNOSIS — R638 Other symptoms and signs concerning food and fluid intake: Secondary | ICD-10-CM | POA: Diagnosis not present

## 2017-08-30 DIAGNOSIS — R9431 Abnormal electrocardiogram [ECG] [EKG]: Secondary | ICD-10-CM | POA: Diagnosis not present

## 2017-08-30 DIAGNOSIS — F329 Major depressive disorder, single episode, unspecified: Secondary | ICD-10-CM | POA: Diagnosis not present

## 2017-08-30 DIAGNOSIS — T80818A Extravasation of other vesicant agent, initial encounter: Secondary | ICD-10-CM | POA: Diagnosis not present

## 2017-08-30 DIAGNOSIS — R42 Dizziness and giddiness: Secondary | ICD-10-CM | POA: Diagnosis not present

## 2017-08-30 DIAGNOSIS — I5030 Unspecified diastolic (congestive) heart failure: Secondary | ICD-10-CM | POA: Diagnosis not present

## 2017-08-30 DIAGNOSIS — R633 Feeding difficulties: Secondary | ICD-10-CM | POA: Diagnosis not present

## 2017-08-30 DIAGNOSIS — N184 Chronic kidney disease, stage 4 (severe): Secondary | ICD-10-CM | POA: Diagnosis not present

## 2017-08-30 DIAGNOSIS — E1122 Type 2 diabetes mellitus with diabetic chronic kidney disease: Secondary | ICD-10-CM | POA: Diagnosis not present

## 2017-08-30 DIAGNOSIS — D638 Anemia in other chronic diseases classified elsewhere: Secondary | ICD-10-CM | POA: Diagnosis present

## 2017-08-30 DIAGNOSIS — E876 Hypokalemia: Secondary | ICD-10-CM | POA: Diagnosis not present

## 2017-08-30 DIAGNOSIS — R072 Precordial pain: Secondary | ICD-10-CM | POA: Diagnosis not present

## 2017-08-30 DIAGNOSIS — I130 Hypertensive heart and chronic kidney disease with heart failure and stage 1 through stage 4 chronic kidney disease, or unspecified chronic kidney disease: Secondary | ICD-10-CM | POA: Diagnosis not present

## 2017-08-30 DIAGNOSIS — J90 Pleural effusion, not elsewhere classified: Secondary | ICD-10-CM | POA: Diagnosis present

## 2017-08-30 DIAGNOSIS — K567 Ileus, unspecified: Secondary | ICD-10-CM | POA: Diagnosis not present

## 2017-08-30 DIAGNOSIS — Z9181 History of falling: Secondary | ICD-10-CM | POA: Diagnosis not present

## 2017-08-30 DIAGNOSIS — I10 Essential (primary) hypertension: Secondary | ICD-10-CM | POA: Diagnosis not present

## 2017-08-30 DIAGNOSIS — E872 Acidosis: Secondary | ICD-10-CM | POA: Diagnosis not present

## 2017-08-30 DIAGNOSIS — K5520 Angiodysplasia of colon without hemorrhage: Secondary | ICD-10-CM | POA: Diagnosis present

## 2017-08-30 DIAGNOSIS — K219 Gastro-esophageal reflux disease without esophagitis: Secondary | ICD-10-CM | POA: Diagnosis not present

## 2017-08-30 DIAGNOSIS — R5381 Other malaise: Secondary | ICD-10-CM | POA: Diagnosis not present

## 2017-08-30 DIAGNOSIS — R627 Adult failure to thrive: Secondary | ICD-10-CM | POA: Diagnosis not present

## 2017-08-30 DIAGNOSIS — S065X9A Traumatic subdural hemorrhage with loss of consciousness of unspecified duration, initial encounter: Secondary | ICD-10-CM | POA: Diagnosis not present

## 2017-08-30 DIAGNOSIS — I4510 Unspecified right bundle-branch block: Secondary | ICD-10-CM | POA: Diagnosis not present

## 2017-08-30 DIAGNOSIS — Z7189 Other specified counseling: Secondary | ICD-10-CM | POA: Diagnosis not present

## 2017-08-30 DIAGNOSIS — K3189 Other diseases of stomach and duodenum: Secondary | ICD-10-CM | POA: Diagnosis not present

## 2017-08-30 DIAGNOSIS — K5903 Drug induced constipation: Secondary | ICD-10-CM | POA: Diagnosis not present

## 2017-08-30 DIAGNOSIS — Z515 Encounter for palliative care: Secondary | ICD-10-CM | POA: Diagnosis not present

## 2017-09-22 ENCOUNTER — Inpatient Hospital Stay (HOSPITAL_COMMUNITY)
Admission: EM | Admit: 2017-09-22 | Discharge: 2017-09-27 | DRG: 101 | Disposition: A | Discharge status: Skilled Nursing Facility | Payer: Medicare Other | Attending: Internal Medicine | Admitting: Internal Medicine

## 2017-09-22 ENCOUNTER — Emergency Department (HOSPITAL_COMMUNITY): Payer: Medicare Other

## 2017-09-22 ENCOUNTER — Encounter (HOSPITAL_COMMUNITY): Payer: Self-pay

## 2017-09-22 DIAGNOSIS — G8929 Other chronic pain: Secondary | ICD-10-CM | POA: Diagnosis present

## 2017-09-22 DIAGNOSIS — Z888 Allergy status to other drugs, medicaments and biological substances status: Secondary | ICD-10-CM

## 2017-09-22 DIAGNOSIS — H409 Unspecified glaucoma: Secondary | ICD-10-CM | POA: Diagnosis present

## 2017-09-22 DIAGNOSIS — I130 Hypertensive heart and chronic kidney disease with heart failure and stage 1 through stage 4 chronic kidney disease, or unspecified chronic kidney disease: Secondary | ICD-10-CM | POA: Diagnosis present

## 2017-09-22 DIAGNOSIS — E119 Type 2 diabetes mellitus without complications: Secondary | ICD-10-CM

## 2017-09-22 DIAGNOSIS — R569 Unspecified convulsions: Principal | ICD-10-CM | POA: Diagnosis present

## 2017-09-22 DIAGNOSIS — M549 Dorsalgia, unspecified: Secondary | ICD-10-CM | POA: Diagnosis present

## 2017-09-22 DIAGNOSIS — G309 Alzheimer's disease, unspecified: Secondary | ICD-10-CM | POA: Diagnosis present

## 2017-09-22 DIAGNOSIS — Z91040 Latex allergy status: Secondary | ICD-10-CM

## 2017-09-22 DIAGNOSIS — F329 Major depressive disorder, single episode, unspecified: Secondary | ICD-10-CM | POA: Diagnosis present

## 2017-09-22 DIAGNOSIS — F0390 Unspecified dementia without behavioral disturbance: Secondary | ICD-10-CM | POA: Diagnosis present

## 2017-09-22 DIAGNOSIS — E7800 Pure hypercholesterolemia, unspecified: Secondary | ICD-10-CM | POA: Diagnosis present

## 2017-09-22 DIAGNOSIS — N184 Chronic kidney disease, stage 4 (severe): Secondary | ICD-10-CM | POA: Diagnosis not present

## 2017-09-22 DIAGNOSIS — I5032 Chronic diastolic (congestive) heart failure: Secondary | ICD-10-CM | POA: Diagnosis present

## 2017-09-22 DIAGNOSIS — R918 Other nonspecific abnormal finding of lung field: Secondary | ICD-10-CM | POA: Diagnosis not present

## 2017-09-22 DIAGNOSIS — I5189 Other ill-defined heart diseases: Secondary | ICD-10-CM | POA: Diagnosis present

## 2017-09-22 DIAGNOSIS — N189 Chronic kidney disease, unspecified: Secondary | ICD-10-CM | POA: Diagnosis present

## 2017-09-22 DIAGNOSIS — N179 Acute kidney failure, unspecified: Secondary | ICD-10-CM | POA: Diagnosis present

## 2017-09-22 DIAGNOSIS — F0280 Dementia in other diseases classified elsewhere without behavioral disturbance: Secondary | ICD-10-CM | POA: Diagnosis present

## 2017-09-22 DIAGNOSIS — R4020 Unspecified coma: Secondary | ICD-10-CM | POA: Diagnosis not present

## 2017-09-22 DIAGNOSIS — R269 Unspecified abnormalities of gait and mobility: Secondary | ICD-10-CM | POA: Diagnosis present

## 2017-09-22 DIAGNOSIS — E1122 Type 2 diabetes mellitus with diabetic chronic kidney disease: Secondary | ICD-10-CM | POA: Diagnosis not present

## 2017-09-22 DIAGNOSIS — Z79899 Other long term (current) drug therapy: Secondary | ICD-10-CM

## 2017-09-22 DIAGNOSIS — Z9109 Other allergy status, other than to drugs and biological substances: Secondary | ICD-10-CM

## 2017-09-22 DIAGNOSIS — H9190 Unspecified hearing loss, unspecified ear: Secondary | ICD-10-CM | POA: Diagnosis present

## 2017-09-22 DIAGNOSIS — S065XAA Traumatic subdural hemorrhage with loss of consciousness status unknown, initial encounter: Secondary | ICD-10-CM | POA: Diagnosis present

## 2017-09-22 DIAGNOSIS — Z8679 Personal history of other diseases of the circulatory system: Secondary | ICD-10-CM

## 2017-09-22 DIAGNOSIS — I959 Hypotension, unspecified: Secondary | ICD-10-CM | POA: Diagnosis present

## 2017-09-22 DIAGNOSIS — I10 Essential (primary) hypertension: Secondary | ICD-10-CM | POA: Diagnosis present

## 2017-09-22 DIAGNOSIS — S065X9A Traumatic subdural hemorrhage with loss of consciousness of unspecified duration, initial encounter: Secondary | ICD-10-CM | POA: Diagnosis present

## 2017-09-22 DIAGNOSIS — K5901 Slow transit constipation: Secondary | ICD-10-CM | POA: Diagnosis present

## 2017-09-22 DIAGNOSIS — Z7951 Long term (current) use of inhaled steroids: Secondary | ICD-10-CM

## 2017-09-22 DIAGNOSIS — I129 Hypertensive chronic kidney disease with stage 1 through stage 4 chronic kidney disease, or unspecified chronic kidney disease: Secondary | ICD-10-CM | POA: Diagnosis not present

## 2017-09-22 DIAGNOSIS — Z87891 Personal history of nicotine dependence: Secondary | ICD-10-CM

## 2017-09-22 DIAGNOSIS — Z841 Family history of disorders of kidney and ureter: Secondary | ICD-10-CM

## 2017-09-22 DIAGNOSIS — F419 Anxiety disorder, unspecified: Secondary | ICD-10-CM | POA: Diagnosis present

## 2017-09-22 DIAGNOSIS — M109 Gout, unspecified: Secondary | ICD-10-CM | POA: Diagnosis present

## 2017-09-22 HISTORY — DX: Other complications of anesthesia, initial encounter: T88.59XA

## 2017-09-22 HISTORY — DX: Adverse effect of unspecified anesthetic, initial encounter: T41.45XA

## 2017-09-22 HISTORY — DX: Personal history of other diseases of the digestive system: Z87.19

## 2017-09-22 LAB — CBC WITH DIFFERENTIAL/PLATELET
BASOS PCT: 0 %
Basophils Absolute: 0 10*3/uL (ref 0.0–0.1)
EOS ABS: 0 10*3/uL (ref 0.0–0.7)
Eosinophils Relative: 1 %
HEMATOCRIT: 37.6 % (ref 36.0–46.0)
HEMOGLOBIN: 12.1 g/dL (ref 12.0–15.0)
LYMPHS ABS: 1.4 10*3/uL (ref 0.7–4.0)
Lymphocytes Relative: 29 %
MCH: 28.7 pg (ref 26.0–34.0)
MCHC: 32.2 g/dL (ref 30.0–36.0)
MCV: 89.3 fL (ref 78.0–100.0)
MONO ABS: 0.5 10*3/uL (ref 0.1–1.0)
MONOS PCT: 10 %
NEUTROS ABS: 3 10*3/uL (ref 1.7–7.7)
Neutrophils Relative %: 60 %
Platelets: 278 10*3/uL (ref 150–400)
RBC: 4.21 MIL/uL (ref 3.87–5.11)
RDW: 15 % (ref 11.5–15.5)
WBC: 5 10*3/uL (ref 4.0–10.5)

## 2017-09-22 LAB — COMPREHENSIVE METABOLIC PANEL
ALBUMIN: 3.1 g/dL — AB (ref 3.5–5.0)
ALK PHOS: 92 U/L (ref 38–126)
ALT: 14 U/L (ref 14–54)
ANION GAP: 11 (ref 5–15)
AST: 30 U/L (ref 15–41)
BILIRUBIN TOTAL: 0.3 mg/dL (ref 0.3–1.2)
BUN: 22 mg/dL — ABNORMAL HIGH (ref 6–20)
CALCIUM: 9 mg/dL (ref 8.9–10.3)
CO2: 24 mmol/L (ref 22–32)
Chloride: 101 mmol/L (ref 101–111)
Creatinine, Ser: 2.08 mg/dL — ABNORMAL HIGH (ref 0.44–1.00)
GFR, EST AFRICAN AMERICAN: 24 mL/min — AB (ref >60)
GFR, EST NON AFRICAN AMERICAN: 21 mL/min — AB (ref >60)
GLUCOSE: 105 mg/dL — AB (ref 65–99)
POTASSIUM: 4 mmol/L (ref 3.5–5.1)
Sodium: 136 mmol/L (ref 135–145)
TOTAL PROTEIN: 7.1 g/dL (ref 6.5–8.1)

## 2017-09-22 LAB — MAGNESIUM: MAGNESIUM: 2.2 mg/dL (ref 1.7–2.4)

## 2017-09-22 MED ORDER — NYSTATIN 100000 UNIT/ML MT SUSP
500000.00 | OROMUCOSAL | Status: DC
Start: 2017-09-22 — End: 2017-09-22

## 2017-09-22 MED ORDER — MAGNESIUM HYDROXIDE 400 MG/5ML PO SUSP
30.00 mL | ORAL | Status: DC
Start: ? — End: 2017-09-22

## 2017-09-22 MED ORDER — MAGNESIUM CITRATE PO SOLN
296.00 mL | ORAL | Status: DC
Start: ? — End: 2017-09-22

## 2017-09-22 MED ORDER — ACETAMINOPHEN 650 MG/20.3ML PO SOLN
650.00 mg | ORAL | Status: DC
Start: ? — End: 2017-09-22

## 2017-09-22 MED ORDER — BISACODYL 10 MG RE SUPP
10.00 mg | RECTAL | Status: DC
Start: ? — End: 2017-09-22

## 2017-09-22 MED ORDER — BISACODYL 5 MG PO TBEC
5.00 mg | DELAYED_RELEASE_TABLET | ORAL | Status: DC
Start: 2017-09-22 — End: 2017-09-22

## 2017-09-22 MED ORDER — FLUTICASONE PROPIONATE 50 MCG/ACT NA SUSP
1.00 | NASAL | Status: DC
Start: 2017-09-23 — End: 2017-09-22

## 2017-09-22 MED ORDER — BRIMONIDINE TARTRATE 0.1 % OP SOLN
1.00 | OPHTHALMIC | Status: DC
Start: 2017-09-23 — End: 2017-09-22

## 2017-09-22 MED ORDER — METOCLOPRAMIDE HCL 5 MG/5ML PO SOLN
5.00 mg | ORAL | Status: DC
Start: 2017-09-22 — End: 2017-09-22

## 2017-09-22 MED ORDER — ESCITALOPRAM OXALATE 5 MG PO TABS
5.00 mg | ORAL_TABLET | ORAL | Status: DC
Start: 2017-09-23 — End: 2017-09-22

## 2017-09-22 MED ORDER — LATANOPROST 0.005 % OP SOLN
1.00 | OPHTHALMIC | Status: DC
Start: 2017-09-22 — End: 2017-09-22

## 2017-09-22 MED ORDER — SODIUM CHLORIDE 0.9 % IV BOLUS (SEPSIS): 1000.0000 mL | Freq: Once | INTRAVENOUS | Status: DC

## 2017-09-22 MED ORDER — ACETAMINOPHEN 160 MG/5ML PO SOLN
650.0000 mg | Freq: Once | ORAL | Status: AC
  Administered 2017-09-22: 650 mg via ORAL
  Filled 2017-09-22: qty 20.3

## 2017-09-22 MED ORDER — LACTULOSE 10 GM/15ML PO SOLN
20.0000 g | Freq: Once | ORAL | Status: AC
  Administered 2017-09-23: 20 g via ORAL
  Filled 2017-09-22: qty 30

## 2017-09-22 MED ORDER — LATANOPROST 0.005 % OP SOLN
1.00 | OPHTHALMIC | Status: DC
Start: 2017-09-23 — End: 2017-09-22

## 2017-09-22 MED ORDER — MIRTAZAPINE 15 MG PO TABS
30.0000 mg | ORAL_TABLET | Freq: Every day | ORAL | Status: DC
  Administered 2017-09-23: 30 mg via ORAL
  Filled 2017-09-22: qty 1

## 2017-09-22 MED ORDER — ONDANSETRON HCL 4 MG/2ML IJ SOLN
4.00 mg | INTRAMUSCULAR | Status: DC
Start: ? — End: 2017-09-22

## 2017-09-22 MED ORDER — DOCUSATE SODIUM 283 MG RE ENEM
1.0000 | ENEMA | Freq: Once | RECTAL | Status: AC
  Administered 2017-09-23: 283 mg via RECTAL
  Filled 2017-09-22: qty 1

## 2017-09-22 MED ORDER — HEPARIN SODIUM (PORCINE) 5000 UNIT/ML IJ SOLN
5000.00 | INTRAMUSCULAR | Status: DC
Start: 2017-09-22 — End: 2017-09-22

## 2017-09-22 MED ORDER — GENERIC EXTERNAL MEDICATION
40.00 mg | Status: DC
Start: 2017-09-22 — End: 2017-09-22

## 2017-09-22 MED ORDER — HYDROCHLOROTHIAZIDE 25 MG PO TABS
25.00 mg | ORAL_TABLET | ORAL | Status: DC
Start: 2017-09-23 — End: 2017-09-22

## 2017-09-22 MED ORDER — LACTULOSE 10 GM/15ML PO SOLN
30.00 g | ORAL | Status: DC
Start: 2017-09-22 — End: 2017-09-22

## 2017-09-22 MED ORDER — VALPROATE SODIUM 500 MG/5ML IV SOLN
500.0000 mg | Freq: Once | INTRAVENOUS | Status: AC
  Administered 2017-09-22: 500 mg via INTRAVENOUS
  Filled 2017-09-22: qty 5

## 2017-09-22 MED ORDER — ALLOPURINOL 100 MG PO TABS
100.00 mg | ORAL_TABLET | ORAL | Status: DC
Start: 2017-09-23 — End: 2017-09-22

## 2017-09-22 MED ORDER — SODIUM CHLORIDE 0.9 % IV BOLUS (SEPSIS)
500.0000 mL | Freq: Once | INTRAVENOUS | Status: AC
  Administered 2017-09-22: 500 mL via INTRAVENOUS

## 2017-09-22 MED ORDER — MIRTAZAPINE 30 MG PO TBDP
30.00 mg | ORAL_TABLET | ORAL | Status: DC
Start: 2017-09-22 — End: 2017-09-22

## 2017-09-22 NOTE — ED Notes (Signed)
Attempted I/O cath, pt not cooperative.  Unsuccessful attempt.  Bladder scan to be done by tech.
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
## 2017-09-22 NOTE — ED Notes (Signed)
EMT techs unable to capture EKG, pt moving and tremors in all leads.  MD made aware

## 2017-09-22 NOTE — ED Triage Notes (Signed)
To room via EMS, from [facility].  Pt d/c from [facility] today.  Pt was being wheeled to dinner and had seizure like activity, left sided gaze, drew arms up to chest and got very tense, lasted 20 seconds, then had another episode.  Pt usually hums all the time.  After seizure like activity pt did no moaning.  Pt now moaning and acting usually self per granddaughter.

## 2017-09-23 ENCOUNTER — Encounter (HOSPITAL_COMMUNITY): Payer: Self-pay | Admitting: Internal Medicine

## 2017-09-23 ENCOUNTER — Observation Stay (HOSPITAL_COMMUNITY): Payer: Medicare Other

## 2017-09-23 ENCOUNTER — Other Ambulatory Visit: Payer: Self-pay

## 2017-09-23 DIAGNOSIS — Z91040 Latex allergy status: Secondary | ICD-10-CM | POA: Diagnosis not present

## 2017-09-23 DIAGNOSIS — M6281 Muscle weakness (generalized): Secondary | ICD-10-CM | POA: Diagnosis not present

## 2017-09-23 DIAGNOSIS — Z87891 Personal history of nicotine dependence: Secondary | ICD-10-CM | POA: Diagnosis not present

## 2017-09-23 DIAGNOSIS — R269 Unspecified abnormalities of gait and mobility: Secondary | ICD-10-CM | POA: Diagnosis present

## 2017-09-23 DIAGNOSIS — E1122 Type 2 diabetes mellitus with diabetic chronic kidney disease: Secondary | ICD-10-CM | POA: Diagnosis present

## 2017-09-23 DIAGNOSIS — Z8679 Personal history of other diseases of the circulatory system: Secondary | ICD-10-CM | POA: Diagnosis not present

## 2017-09-23 DIAGNOSIS — I519 Heart disease, unspecified: Secondary | ICD-10-CM | POA: Diagnosis not present

## 2017-09-23 DIAGNOSIS — H409 Unspecified glaucoma: Secondary | ICD-10-CM | POA: Diagnosis present

## 2017-09-23 DIAGNOSIS — H9190 Unspecified hearing loss, unspecified ear: Secondary | ICD-10-CM | POA: Diagnosis present

## 2017-09-23 DIAGNOSIS — I10 Essential (primary) hypertension: Secondary | ICD-10-CM | POA: Diagnosis not present

## 2017-09-23 DIAGNOSIS — S065X0A Traumatic subdural hemorrhage without loss of consciousness, initial encounter: Secondary | ICD-10-CM | POA: Diagnosis not present

## 2017-09-23 DIAGNOSIS — F0390 Unspecified dementia without behavioral disturbance: Secondary | ICD-10-CM | POA: Diagnosis not present

## 2017-09-23 DIAGNOSIS — Z841 Family history of disorders of kidney and ureter: Secondary | ICD-10-CM | POA: Diagnosis not present

## 2017-09-23 DIAGNOSIS — F0280 Dementia in other diseases classified elsewhere without behavioral disturbance: Secondary | ICD-10-CM | POA: Diagnosis not present

## 2017-09-23 DIAGNOSIS — E119 Type 2 diabetes mellitus without complications: Secondary | ICD-10-CM | POA: Diagnosis not present

## 2017-09-23 DIAGNOSIS — N184 Chronic kidney disease, stage 4 (severe): Secondary | ICD-10-CM | POA: Diagnosis not present

## 2017-09-23 DIAGNOSIS — G308 Other Alzheimer's disease: Secondary | ICD-10-CM | POA: Diagnosis not present

## 2017-09-23 DIAGNOSIS — I129 Hypertensive chronic kidney disease with stage 1 through stage 4 chronic kidney disease, or unspecified chronic kidney disease: Secondary | ICD-10-CM | POA: Diagnosis not present

## 2017-09-23 DIAGNOSIS — N179 Acute kidney failure, unspecified: Secondary | ICD-10-CM | POA: Diagnosis not present

## 2017-09-23 DIAGNOSIS — Z888 Allergy status to other drugs, medicaments and biological substances status: Secondary | ICD-10-CM | POA: Diagnosis not present

## 2017-09-23 DIAGNOSIS — I130 Hypertensive heart and chronic kidney disease with heart failure and stage 1 through stage 4 chronic kidney disease, or unspecified chronic kidney disease: Secondary | ICD-10-CM | POA: Diagnosis present

## 2017-09-23 DIAGNOSIS — E7800 Pure hypercholesterolemia, unspecified: Secondary | ICD-10-CM | POA: Diagnosis present

## 2017-09-23 DIAGNOSIS — R41841 Cognitive communication deficit: Secondary | ICD-10-CM | POA: Diagnosis not present

## 2017-09-23 DIAGNOSIS — K5901 Slow transit constipation: Secondary | ICD-10-CM | POA: Diagnosis not present

## 2017-09-23 DIAGNOSIS — I5030 Unspecified diastolic (congestive) heart failure: Secondary | ICD-10-CM | POA: Diagnosis not present

## 2017-09-23 DIAGNOSIS — I5032 Chronic diastolic (congestive) heart failure: Secondary | ICD-10-CM | POA: Diagnosis present

## 2017-09-23 DIAGNOSIS — M109 Gout, unspecified: Secondary | ICD-10-CM | POA: Diagnosis present

## 2017-09-23 DIAGNOSIS — I959 Hypotension, unspecified: Secondary | ICD-10-CM | POA: Diagnosis present

## 2017-09-23 DIAGNOSIS — R569 Unspecified convulsions: Secondary | ICD-10-CM | POA: Diagnosis not present

## 2017-09-23 DIAGNOSIS — M549 Dorsalgia, unspecified: Secondary | ICD-10-CM | POA: Diagnosis present

## 2017-09-23 DIAGNOSIS — N183 Chronic kidney disease, stage 3 (moderate): Secondary | ICD-10-CM | POA: Diagnosis not present

## 2017-09-23 DIAGNOSIS — E861 Hypovolemia: Secondary | ICD-10-CM | POA: Diagnosis not present

## 2017-09-23 DIAGNOSIS — F329 Major depressive disorder, single episode, unspecified: Secondary | ICD-10-CM | POA: Diagnosis present

## 2017-09-23 DIAGNOSIS — S065X9A Traumatic subdural hemorrhage with loss of consciousness of unspecified duration, initial encounter: Secondary | ICD-10-CM | POA: Diagnosis not present

## 2017-09-23 DIAGNOSIS — G40909 Epilepsy, unspecified, not intractable, without status epilepticus: Secondary | ICD-10-CM | POA: Diagnosis not present

## 2017-09-23 DIAGNOSIS — F419 Anxiety disorder, unspecified: Secondary | ICD-10-CM | POA: Diagnosis present

## 2017-09-23 DIAGNOSIS — G309 Alzheimer's disease, unspecified: Secondary | ICD-10-CM | POA: Diagnosis present

## 2017-09-23 DIAGNOSIS — G8929 Other chronic pain: Secondary | ICD-10-CM | POA: Diagnosis present

## 2017-09-23 DIAGNOSIS — I9589 Other hypotension: Secondary | ICD-10-CM | POA: Diagnosis not present

## 2017-09-23 LAB — URINALYSIS, ROUTINE W REFLEX MICROSCOPIC
Bilirubin Urine: NEGATIVE
GLUCOSE, UA: NEGATIVE mg/dL
HGB URINE DIPSTICK: NEGATIVE
Ketones, ur: NEGATIVE mg/dL
NITRITE: NEGATIVE
PH: 9 — AB (ref 5.0–8.0)
PROTEIN: 100 mg/dL — AB
Specific Gravity, Urine: 1.021 (ref 1.005–1.030)

## 2017-09-23 LAB — CBC
HEMATOCRIT: 33.5 % — AB (ref 36.0–46.0)
HEMOGLOBIN: 10.6 g/dL — AB (ref 12.0–15.0)
MCH: 28.5 pg (ref 26.0–34.0)
MCHC: 31.6 g/dL (ref 30.0–36.0)
MCV: 90.1 fL (ref 78.0–100.0)
Platelets: 285 10*3/uL (ref 150–400)
RBC: 3.72 MIL/uL — ABNORMAL LOW (ref 3.87–5.11)
RDW: 15 % (ref 11.5–15.5)
WBC: 5.9 10*3/uL (ref 4.0–10.5)

## 2017-09-23 LAB — BASIC METABOLIC PANEL
Anion gap: 9 (ref 5–15)
BUN: 24 mg/dL — AB (ref 6–20)
CHLORIDE: 105 mmol/L (ref 101–111)
CO2: 27 mmol/L (ref 22–32)
Calcium: 9 mg/dL (ref 8.9–10.3)
Creatinine, Ser: 2.26 mg/dL — ABNORMAL HIGH (ref 0.44–1.00)
GFR calc Af Amer: 22 mL/min — ABNORMAL LOW (ref >60)
GFR calc non Af Amer: 19 mL/min — ABNORMAL LOW (ref >60)
Glucose, Bld: 71 mg/dL (ref 65–99)
POTASSIUM: 4.3 mmol/L (ref 3.5–5.1)
SODIUM: 141 mmol/L (ref 135–145)

## 2017-09-23 LAB — CBG MONITORING, ED: GLUCOSE-CAPILLARY: 87 mg/dL (ref 65–99)

## 2017-09-23 MED ORDER — PANTOPRAZOLE SODIUM 40 MG PO TBEC
40.0000 mg | DELAYED_RELEASE_TABLET | Freq: Two times a day (BID) | ORAL | Status: DC
  Administered 2017-09-23 – 2017-09-25 (×5): 40 mg via ORAL
  Filled 2017-09-23 (×7): qty 1

## 2017-09-23 MED ORDER — BRIMONIDINE TARTRATE 0.15 % OP SOLN
1.0000 [drp] | Freq: Three times a day (TID) | OPHTHALMIC | Status: DC
  Administered 2017-09-23 – 2017-09-26 (×10): 1 [drp] via OPHTHALMIC
  Filled 2017-09-23 (×2): qty 5

## 2017-09-23 MED ORDER — LACTULOSE 10 GM/15ML PO SOLN
30.0000 g | Freq: Three times a day (TID) | ORAL | Status: DC
  Administered 2017-09-23 – 2017-09-27 (×10): 30 g via ORAL
  Filled 2017-09-23 (×15): qty 45

## 2017-09-23 MED ORDER — ENOXAPARIN SODIUM 30 MG/0.3ML ~~LOC~~ SOLN: 30.0000 mg | Freq: Every day | SUBCUTANEOUS | Status: DC

## 2017-09-23 MED ORDER — SENNOSIDES-DOCUSATE SODIUM 8.6-50 MG PO TABS
1.0000 | ORAL_TABLET | Freq: Every day | ORAL | Status: DC
  Administered 2017-09-23 – 2017-09-25 (×3): 1 via ORAL
  Filled 2017-09-23 (×3): qty 1

## 2017-09-23 MED ORDER — FLUTICASONE PROPIONATE 50 MCG/ACT NA SUSP
1.0000 | Freq: Every day | NASAL | Status: DC
  Administered 2017-09-23 – 2017-09-26 (×4): 1 via NASAL
  Filled 2017-09-23 (×2): qty 16

## 2017-09-23 MED ORDER — LATANOPROST 0.005 % OP SOLN
1.0000 [drp] | Freq: Every day | OPHTHALMIC | Status: DC
  Administered 2017-09-25 – 2017-09-26 (×2): 1 [drp] via OPHTHALMIC
  Filled 2017-09-23 (×2): qty 2.5

## 2017-09-23 MED ORDER — ACETAMINOPHEN 325 MG PO TABS: 650.0000 mg | ORAL_TABLET | Freq: Four times a day (QID) | ORAL | Status: DC | PRN

## 2017-09-23 MED ORDER — GI COCKTAIL ~~LOC~~
30.0000 mL | Freq: Once | ORAL | Status: DC
  Filled 2017-09-23: qty 30

## 2017-09-23 MED ORDER — ONDANSETRON HCL 4 MG PO TABS
4.0000 mg | ORAL_TABLET | Freq: Four times a day (QID) | ORAL | Status: DC | PRN
Start: 2017-09-23 — End: 2017-09-27

## 2017-09-23 MED ORDER — ENSURE ENLIVE PO LIQD
237.0000 mL | Freq: Three times a day (TID) | ORAL | Status: DC
Start: 2017-09-23 — End: 2017-09-27
  Administered 2017-09-23 – 2017-09-26 (×7): 237 mL via ORAL

## 2017-09-23 MED ORDER — BISACODYL 5 MG PO TBEC
5.0000 mg | DELAYED_RELEASE_TABLET | Freq: Every day | ORAL | Status: DC
  Administered 2017-09-24: 5 mg via ORAL
  Filled 2017-09-23: qty 1

## 2017-09-23 MED ORDER — MAGNESIUM OXIDE 400 (241.3 MG) MG PO TABS
400.0000 mg | ORAL_TABLET | Freq: Two times a day (BID) | ORAL | Status: DC
  Administered 2017-09-23 – 2017-09-27 (×8): 400 mg via ORAL
  Filled 2017-09-23 (×11): qty 1

## 2017-09-23 MED ORDER — ACETAMINOPHEN 650 MG RE SUPP: 650.0000 mg | Freq: Four times a day (QID) | RECTAL | Status: DC | PRN

## 2017-09-23 MED ORDER — POTASSIUM CHLORIDE CRYS ER 10 MEQ PO TBCR
20.0000 meq | EXTENDED_RELEASE_TABLET | Freq: Every day | ORAL | Status: DC
  Administered 2017-09-25 – 2017-09-26 (×2): 20 meq via ORAL
  Filled 2017-09-23 (×5): qty 2

## 2017-09-23 MED ORDER — ESCITALOPRAM OXALATE 10 MG PO TABS
10.0000 mg | ORAL_TABLET | Freq: Every day | ORAL | Status: DC
  Administered 2017-09-23 – 2017-09-27 (×5): 10 mg via ORAL
  Filled 2017-09-23 (×5): qty 1

## 2017-09-23 MED ORDER — MIRTAZAPINE 30 MG PO TABS
30.0000 mg | ORAL_TABLET | Freq: Every day | ORAL | Status: DC
  Administered 2017-09-24 – 2017-09-26 (×4): 30 mg via ORAL
  Filled 2017-09-23: qty 2
  Filled 2017-09-23: qty 1
  Filled 2017-09-23: qty 2
  Filled 2017-09-23: qty 1
  Filled 2017-09-23: qty 2
  Filled 2017-09-23 (×3): qty 1
  Filled 2017-09-23: qty 2

## 2017-09-23 MED ORDER — ONDANSETRON HCL 4 MG/2ML IJ SOLN: 4.0000 mg | Freq: Four times a day (QID) | INTRAMUSCULAR | Status: DC | PRN

## 2017-09-23 MED ORDER — SODIUM CHLORIDE 0.9 % IV BOLUS (SEPSIS)
500.0000 mL | Freq: Once | INTRAVENOUS | Status: AC
  Administered 2017-09-23: 500 mL via INTRAVENOUS

## 2017-09-23 MED ORDER — SODIUM CHLORIDE 0.9 % IV BOLUS (SEPSIS)
250.0000 mL | Freq: Once | INTRAVENOUS | Status: AC
  Administered 2017-09-23: 250 mL via INTRAVENOUS

## 2017-09-23 MED ORDER — METOCLOPRAMIDE HCL 10 MG PO TABS
5.0000 mg | ORAL_TABLET | Freq: Three times a day (TID) | ORAL | Status: DC
  Administered 2017-09-23 – 2017-09-27 (×14): 5 mg via ORAL
  Filled 2017-09-23 (×14): qty 1

## 2017-09-23 MED ORDER — VALPROATE SODIUM 500 MG/5ML IV SOLN
500.0000 mg | Freq: Two times a day (BID) | INTRAVENOUS | Status: DC
  Administered 2017-09-24 – 2017-09-25 (×3): 500 mg via INTRAVENOUS
  Filled 2017-09-23 (×4): qty 5

## 2017-09-23 MED ORDER — ALLOPURINOL 300 MG PO TABS
150.0000 mg | ORAL_TABLET | Freq: Every day | ORAL | Status: DC
  Administered 2017-09-23: 150 mg via ORAL
  Filled 2017-09-23: qty 2
  Filled 2017-09-23: qty 0.5

## 2017-09-23 MED ORDER — HYDRALAZINE HCL 20 MG/ML IJ SOLN: 10.0000 mg | INTRAMUSCULAR | Status: DC | PRN

## 2017-09-23 MED ORDER — HEPARIN SODIUM (PORCINE) 5000 UNIT/ML IJ SOLN
5000.0000 [IU] | Freq: Three times a day (TID) | INTRAMUSCULAR | Status: DC
  Administered 2017-09-24 – 2017-09-27 (×10): 5000 [IU] via SUBCUTANEOUS
  Filled 2017-09-23 (×10): qty 1

## 2017-09-23 NOTE — Progress Notes (Signed)
Patient disoriented x 4, daughter requesting sitter for night.  BP 87/52. MD notified

## 2017-09-23 NOTE — ED Notes (Signed)
Regular tray ordered.

## 2017-09-23 NOTE — Consult Note (Signed)
Neurology Consultation Reason for Consult: Seizures Referring Physician: Ellender Hose, C  CC: Seizures  History is obtained from: Daughter  HPI: Tanya Smith is a 83 y.o. female with a history of severe dementia as well as head injury who presents with new onset seizures.  Her daughter states that she was with her earlier tonight when she suddenly had behavioral arrest, followed by leftward gaze and "tensing up."  This lasted less than a minute, then she was confused.  A few minutes later, she had a recurrent episode.  Her daughter states that she remained abnormal until the ambulance ride here.  Her daughter states that she is normally not very verbal after about 5 PM.  She states that she is close to her normal self currently.   ROS: Unable to obtain due to altered mental status.   Past Medical History:  Diagnosis Date  . Abdominal muscle defects   . Alzheimer disease   . Anxiety   . Bladder prolapse   . CHF (congestive heart failure) ([place])   . Chronic back pain   . Constipation   . Dementia   . Depression   . Diabetes mellitus   . Glaucoma   . Gout   . Hearing loss   . High cholesterol   . Hypertension   . Postnasal drip   . Renal disorder   . Seasonal allergies   . Urinary incontinence   . Vertigo      Family History  Problem Relation Age of Onset  . Kidney disease Father      Social History:  reports that she has quit smoking. Her smoking use included cigarettes. She smoked 0.50 packs per day. she has never used smokeless tobacco. She reports that she does not drink alcohol or use drugs.   Exam: Current vital signs: BP 104/61   Pulse 69   Temp (!) 97.5 F (36.4 C) (Axillary)   Resp 18   SpO2 97%  Vital signs in last 24 hours: Temp:  [97.5 F (36.4 C)] 97.5 F (36.4 C) (11/27 1852) Pulse Rate:  [61-87] 69 (11/28 0009) Resp:  [8-25] 18 (11/28 0009) BP: (91-143)/(57-105) 104/61 (11/28 0009) SpO2:  [62 %-100 %] 97 % (11/28 0009)   Physical Exam   Constitutional: Appears well-developed and well-nourished.  Psych: Affect appropriate to situation Eyes: No scleral injection HENT: No OP obstrucion Head: Normocephalic.  Cardiovascular: Normal rate and regular rhythm.  Respiratory: Effort normal and breath sounds normal to anterior ascultation GI: Soft.  No distension. There is no tenderness.  Skin: WDI  Neuro: Mental Status: Patient is awake, but actively tries to avoid engaging with the examiner.  She does not answer commands Cranial Nerves: II: She blinks to threat bilaterally, pupils are  Difficult to check due to patient's active avoidance, but they are equal.   III,IV, VI: She tracks across midline in both directions V: Reaction to facial stimuli bilaterally VII: Facial movement is symmetric.  Motor:  She does not follow commands, however she does move all extremities spontaneously Sensory: She responds to noxious stimulus bilaterally Cerebellar: Does not participate    I have reviewed labs in epic and the results pertinent to this consultation are: CMP-mildly elevated creatinine CBC-normal Magnesium-normal UA-pending  I have reviewed the images obtained: CT head-unremarkable  Impression: 83 year old female with advanced dementia with new onset seizures.  I suspect that without treatment, she would be at high risk of seizure recurrence and therefore I would recommend going ahead and starting antiepileptic therapy.  I would favor Depakote.  Recommendations: 1) Depakote 500 mg twice daily 2) EEG   Roland Rack, MD [facility] [phone]  If 7pm- 7am, please page neurology on call as listed in [place].
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
## 2017-09-23 NOTE — Progress Notes (Signed)
PROGRESS NOTE  Tanya DEUTSCHMAN EVO:[id number] DOB: 1930-10-23 DOA: 09/22/2017 PCP: Lavone Orn, MD  HPI/Recap of past 24 hours: Tanya Smith is a 83 yo F from SNF with PMH significant for severe dementia, subdural hemorrhage, chronic diastolic CHF, CKD, gout, was brought in due to new onset seizures witnessed at SNF by daughter. Generalized tonic clonic x2. Recent admission at [facility] after hospitalization 2-3 weeks for constipation self reported by daughter and discharged 1 day prior to presentation.  No acute events overnight. Neurology following. Depakote 500 mg BID started and EEG ordered. CT head with no definite CT evidence of acute intracranial abnormalities.  Patient was seen and examined with her daughter at her bedside. She is eating her meal. No sign of distress. Daughter states she is not back to her baseline and that she grimaced earlier by holding her upper abdomen. GI cocktail ordered for presumed upper GI abd pain.  Assessment/Plan: Principal Problem:   Seizures ([place]) Active Problems:   Hypertension   Diabetes mellitus (HCC)   Dementia   Chronic kidney disease   Diastolic dysfunction   SDH (subdural hematoma) (HCC)  New onset seizures  -no recurrenece reported during this admission -Neurology following -depakote IV 500 mg BID -depakote level am -CMP am  AKI on ckd 3 -cr 2.26 from 2.08 baseline cr 1.8 -IV fluid hydration -BMP am -monitor urine output -avoid nephrotoxic meds  Chronic constipation -xray showing retained stools -enema, lactulose, sennokot, reglan  Chronic diastolic CHF -appears stable -2D echo 12/24/12 LVEF 60-65% -allergic Ace I -BP is soft HR 60's 70's  HTN -No acute issues -Hold hctz due to AKI  -BP is soft -If hypertensive and heart rate can tolerate consider a Beta blocker  -Allergic to Ace I  Severe dementia -Reorient as needed -Fall precaution  Gout -No acute issues -hold allopurinol due to AKI and recent  seizures  Anxiety/depression -escitalopram, mirtazapine -withdrawal sx if abrupt d/c of mirtazapine  Ambulatory dysfunction -PT to evaluate and treat   Code Status: Full  Family Communication: Daughter at her bedside. All questions answered to hwer satisfaction  Disposition Plan: Will stay another midnight to monitor symptoms. Started on new medication Depakote today.   Consultants:  Neurology  Procedures:  EEG  Antimicrobials:  No indicated at this time  DVT prophylaxis:  heparin 5000 U sq TID   Objective: Vitals:   09/23/17 0702 09/23/17 0753 09/23/17 0836 09/23/17 0841  BP: (!) 113/49 (!) 123/55 (!) 110/47   Pulse:    64  Resp: 19 13  16   Temp:      TempSrc:      SpO2:    97%   No intake or output data in the 24 hours ending 09/23/17 0853 There were no vitals filed for this visit.  Exam:   General:  83 yo AAFD WD WN NAD. Alert but confused due to baseline severe dementia.   Cardiovascular: RRR with no rubs, gallops   Respiratory: CTA with no wheezes or rhonchi   Abdomen: Soft NT ND NBS x4  quadrants  Musculoskeletal: Moves all limbs   Skin: No noted open lesions  Psychiatry: Baseline dementia. Mood is appropriate for condition and setting.   Data Reviewed: CBC: Recent Labs  Lab 09/22/17 2017 09/23/17 0354  WBC 5.0 5.9  NEUTROABS 3.0  --   HGB 12.1 10.6*  HCT 37.6 33.5*  MCV 89.3 90.1  PLT 278 299   Basic Metabolic Panel: Recent Labs  Lab 09/22/17 2017 09/23/17 0354  NA  136 141  K 4.0 4.3  CL 101 105  CO2 24 27  GLUCOSE 105* 71  BUN 22* 24*  CREATININE 2.08* 2.26*  CALCIUM 9.0 9.0  MG 2.2  --    GFR: CrCl cannot be calculated (Unknown ideal weight.). Liver Function Tests: Recent Labs  Lab 09/22/17 2017  AST 30  ALT 14  ALKPHOS 92  BILITOT 0.3  PROT 7.1  ALBUMIN 3.1*   No results for input(s): LIPASE, AMYLASE in the last 168 hours. No results for input(s): AMMONIA in the last 168 hours. Coagulation  Profile: No results for input(s): INR, PROTIME in the last 168 hours. Cardiac Enzymes: No results for input(s): CKTOTAL, CKMB, CKMBINDEX, TROPONINI in the last 168 hours. BNP (last 3 results) No results for input(s): PROBNP in the last 8760 hours. HbA1C: No results for input(s): HGBA1C in the last 72 hours. CBG: Recent Labs  Lab 09/23/17 0752  GLUCAP 87   Lipid Profile: No results for input(s): CHOL, HDL, LDLCALC, TRIG, CHOLHDL, LDLDIRECT in the last 72 hours. Thyroid Function Tests: No results for input(s): TSH, T4TOTAL, FREET4, T3FREE, THYROIDAB in the last 72 hours. Anemia Panel: No results for input(s): VITAMINB12, FOLATE, FERRITIN, TIBC, IRON, RETICCTPCT in the last 72 hours. Urine analysis:    Component Value Date/Time   COLORURINE YELLOW 08/29/2017 0110   APPEARANCEUR CLEAR 08/29/2017 0110   LABSPEC 1.015 08/29/2017 0110   PHURINE 6.0 08/29/2017 0110   GLUCOSEU NEGATIVE 08/29/2017 0110   HGBUR NEGATIVE 08/29/2017 0110   BILIRUBINUR NEGATIVE 08/29/2017 0110   KETONESUR NEGATIVE 08/29/2017 0110   PROTEINUR 100 (A) 08/29/2017 0110   UROBILINOGEN 0.2 06/14/2013 0356   NITRITE NEGATIVE 08/29/2017 0110   LEUKOCYTESUR NEGATIVE 08/29/2017 0110   Sepsis Labs: @LABRCNTIP (procalcitonin:4,lacticidven:4)  )No results found for this or any previous visit (from the past 240 hour(s)).    Studies: Ct Head Wo Contrast  Result Date: 09/22/2017 CLINICAL DATA:  Altered level of consciousness EXAM: CT HEAD WITHOUT CONTRAST TECHNIQUE: Contiguous axial images were obtained from the base of the skull through the vertex without intravenous contrast. COMPARISON:  02/25/2017, 02/22/2017, 06/14/2013 FINDINGS: Brain: Motion degradation. No definite acute territorial infarction, hemorrhage or mass. Old left cerebellar infarct. Atrophy and moderate small vessel ischemic changes of the white matter. Probable old lacunar infarcts in the thalamus. Stable ventricle size. Old right temporal lobe  infarct. Vascular: No hyperdense vessels.  Carotid vessel calcification Skull: No fracture Sinuses/Orbits: No acute finding. Other: None IMPRESSION: 1. Motion degraded study 2. No definite CT evidence for acute intracranial abnormality 3. Atrophy with small vessel ischemic changes of the white matter. Old right temporal and left cerebellar infarcts. Electronically Signed   By: Donavan Foil M.D.   On: 09/22/2017 21:23   Dg Abdomen Acute W/chest  Result Date: 09/22/2017 CLINICAL DATA:  83 y/o  F; intermittent chest pain. EXAM: DG ABDOMEN ACUTE W/ 1V CHEST COMPARISON:  08/28/2017 chest radiograph. FINDINGS: Stable cardiomegaly. Aortic atherosclerosis with calcification. Hazy opacities at lung bases probably represent atelectasis. Rounded opacity arising from left hemidiaphragm. Bowel super position over the liver. The colon is mildly dilated with large volume of stool. Extensive degenerative changes of the spine. No acute osseous abnormality is evident. IMPRESSION: 1. Hazy opacification of lung bases, probably atelectasis and small effusions. 2. Mild dilatation of the colon and large volume of stool, probably constipation/ fecal impaction. 3. Rounded opacity arising from left hemidiaphragm, question hiatal or diaphragmatic hernia. 4. Cardiomegaly and aortic atherosclerosis. Electronically Signed   By: Kristine Garbe  M.D.   On: 09/22/2017 21:01    Scheduled Meds: . allopurinol  150 mg Oral Daily  . bisacodyl  5 mg Oral QHS  . brimonidine  1 drop Both Eyes TID  . escitalopram  10 mg Oral Daily  . feeding supplement (ENSURE ENLIVE)  237 mL Oral TID  . fluticasone  1 spray Each Nare Daily  . heparin subcutaneous  5,000 Units Subcutaneous Q8H  . lactulose  30 g Oral TID  . latanoprost  1 drop Both Eyes QHS  . magnesium oxide  400 mg Oral BID  . metoCLOPramide  5 mg Oral TID AC & HS  . mirtazapine  30 mg Oral QHS  . mirtazapine  30 mg Oral QHS  . pantoprazole  40 mg Oral BID  . potassium  chloride  20 mEq Oral Daily  . senna-docusate  1 tablet Oral Daily    Continuous Infusions: . [START ON 09/24/2017] valproate sodium       LOS: 0 days     Kayleen Memos, MD [facility] Pager [phone]  If 7PM-7AM, please contact night-coverage [email or web address] Password [facility] 09/23/2017, 8:53 AM

## 2017-09-23 NOTE — Progress Notes (Signed)
Routine EEG completed, results pending. 

## 2017-09-23 NOTE — Procedures (Signed)
ELECTROENCEPHALOGRAM REPORT  Date of Study: 09/23/2017  Patient's Name: [name] MRN: [id number] Date of Birth: 11-06-1929  Referring Provider: Dr. Roland Rack  Clinical History: This is an 83 year old woman with new onset seizures with persistent altered mental status.  Medications: valproate (DEPACON) 500 mg in dextrose 5 % 50 mL IVPB  acetaminophen (TYLENOL) tablet 650 mg  bisacodyl (DULCOLAX) EC tablet 5 mg  brimonidine (ALPHAGAN) 0.15 % ophthalmic solution 1 drop  escitalopram (LEXAPRO) tablet 10 mg  feeding supplement (ENSURE ENLIVE) (ENSURE ENLIVE) liquid 237 mL  fluticasone (FLONASE) 50 MCG/ACT nasal spray 1 spray  gi cocktail (Maalox,Lidocaine,Donnatal)  heparin injection 5,000 Units  hydrALAZINE (APRESOLINE) injection 10 mg  lactulose (CHRONULAC) 10 GM/15ML solution 30 g  latanoprost (XALATAN) 0.005 % ophthalmic solution 1 drop  magnesium oxide (MAG-OX) tablet 400 mg  metoCLOPramide (REGLAN) tablet 5 mg  mirtazapine (REMERON) tablet 30 mg  pantoprazole (PROTONIX) EC tablet 40 mg  potassium chloride (K-DUR,KLOR-CON) CR tablet 20 mEq  senna-docusate (Senokot-S) tablet 1 tablet   Technical Summary: A multichannel digital EEG recording measured by the international 10-20 system with electrodes applied with paste and impedances below 5000 ohms performed as portable with EKG monitoring in an awake and asleep patient.  Hyperventilation and photic stimulation were not performed.  The digital EEG was referentially recorded, reformatted, and digitally filtered in a variety of bipolar and referential montages for optimal display.   Description: The patient is awake and asleep during the recording. There is no clear posterior dominant rhythm. The background consists of predominantly diffuse 5-6 Hz theta slowing with no focal features seen. During drowsiness and sleep, there is an increase in theta and delta slowing of the background with poorly formed vertex waves seen.   Hyperventilation and photic stimulation were not performed. There were no epileptiform discharges or electrographic seizures seen.    EKG lead was unremarkable.  Impression: This awake and asleep EEG is abnormal due to mild diffuse slowing of the waking background.  Clinical Correlation of the above findings indicates diffuse cerebral dysfunction that is non-specific in etiology and can be seen with hypoxic/ischemic injury, toxic/metabolic encephalopathies, neurodegenerative disorders, or medication effect.  The absence of epileptiform discharges does not rule out a clinical diagnosis of epilepsy.  Clinical correlation is advised.   Ellouise Newer, M.D.

## 2017-09-23 NOTE — Evaluation (Signed)
Physical Therapy Evaluation Patient Details Name: Tanya Smith MRN: [id number] DOB: 06-Jul-1930 Today's Date: 09/23/2017   History of Present Illness  Tanya Smith is a 83 y.o. female with a history of severe dementia as well as head injury who presents with new onset seizures. PMH positive for Alzheimer's dementia, bladder prolapse, back pain, depression, Glaucoma and gout.  Clinical Impression  Patient presents with slower mobility and some general weakness following seizures.  She will benefit from skilled PT in the acute setting to allow return to prior function and to lessen fall risk at facility.      Follow Up Recommendations SNF    Equipment Recommendations  None recommended by PT    Recommendations for Other Services       Precautions / Restrictions Precautions Precautions: Fall      Mobility  Bed Mobility Overal bed mobility: Needs Assistance Bed Mobility: Supine to Sit;Sit to Supine     Supine to sit: Mod assist;HOB elevated Sit to supine: Min assist;HOB elevated   General bed mobility comments: assist for initiation to sit, assist for positioning to supine  Transfers Overall transfer level: Needs assistance Equipment used: 1 person hand held assist Transfers: Sit to/from Stand Sit to Stand: Mod assist         General transfer comment: lifting assist for initiation  Ambulation/Gait Ambulation/Gait assistance: Min assist;Mod assist Ambulation Distance (Feet): 140 Feet Assistive device: 1 person hand held assist Gait Pattern/deviations: Decreased stride length;Wide base of support     General Gait Details: assist for balance, safety, direction, pt incontinent of small amount of stool/urine during gait, noted some lateral instability and impulsivity with directions  Stairs            Wheelchair Mobility    Modified Rankin (Stroke Patients Only)       Balance Overall balance assessment: Needs assistance   Sitting balance-Leahy Scale: Fair      Standing balance support: During functional activity;Single extremity supported Standing balance-Leahy Scale: Poor Standing balance comment: assist for balance and hygiene after toileting                             Pertinent Vitals/Pain Pain Assessment: No/denies pain    Home Living Family/patient expects to be discharged to:: Skilled nursing facility                      Prior Function           Comments: daughters report pt walked unaided until just recently     Hand Dominance        Extremity/Trunk Assessment   Upper Extremity Assessment Upper Extremity Assessment: Overall WFL for tasks assessed    Lower Extremity Assessment Lower Extremity Assessment: Generalized weakness       Communication   Communication: Expressive difficulties;Receptive difficulties(due to dementia)  Cognition Arousal/Alertness: Awake/alert Behavior During Therapy: Impulsive Overall Cognitive Status: History of cognitive impairments - at baseline                                        General Comments General comments (skin integrity, edema, etc.): daughters here during initial encounter, but pt sleeping so returned for mobility assessment, but daughters were out of the room.     Exercises     Assessment/Plan    PT Assessment Patient needs continued  PT services  PT Problem List Decreased strength;Decreased mobility;Decreased safety awareness;Decreased balance;Decreased knowledge of precautions       PT Treatment Interventions Therapeutic activities;Gait training;Therapeutic exercise;Patient/family education;Balance training;Functional mobility training;Neuromuscular re-education    PT Goals (Current goals can be found in the Care Plan section)  Acute Rehab PT Goals Patient Stated Goal: To return to facility per family  PT Goal Formulation: With family Time For Goal Achievement: 10/07/17 Potential to Achieve Goals: Good    Frequency  Min 2X/week   Barriers to discharge        Co-evaluation               AM-PAC PT "6 Clicks" Daily Activity  Outcome Measure Difficulty turning over in bed (including adjusting bedclothes, sheets and blankets)?: Unable Difficulty moving from lying on back to sitting on the side of the bed? : Unable Difficulty sitting down on and standing up from a chair with arms (e.g., wheelchair, bedside commode, etc,.)?: Unable Help needed moving to and from a bed to chair (including a wheelchair)?: A Little Help needed walking in hospital room?: A Little Help needed climbing 3-5 steps with a railing? : A Lot 6 Click Score: 11    End of Session Equipment Utilized During Treatment: Gait belt Activity Tolerance: Patient tolerated treatment well Patient left: in bed;with call bell/phone within reach;with bed alarm set   PT Visit Diagnosis: Other abnormalities of gait and mobility (R26.89);Other symptoms and signs involving the nervous system (R29.898)    Time: 3419-6222 PT Time Calculation (min) (ACUTE ONLY): 32 min   Charges:     PT Treatments $Gait Training: 8-22 mins   PT G Codes:   PT G-Codes **NOT FOR INPATIENT CLASS** Functional Assessment Tool Used: AM-PAC 6 Clicks Basic Mobility Functional Limitation: Mobility: Walking and moving around Mobility: Walking and Moving Around Current Status (L7989): At least 60 percent but less than 80 percent impaired, limited or restricted Mobility: Walking and Moving Around Goal Status [phone]): At least 40 percent but less than 60 percent impaired, limited or restricted    [place], [place] 09/23/2017   Reginia Naas 09/23/2017, 5:32 PM

## 2017-09-23 NOTE — ED Notes (Signed)
Second attempt to in and out cath was unsuccessful, pt tolerated well, dr Hal Hope was made aware

## 2017-09-24 DIAGNOSIS — I519 Heart disease, unspecified: Secondary | ICD-10-CM

## 2017-09-24 DIAGNOSIS — E861 Hypovolemia: Secondary | ICD-10-CM

## 2017-09-24 DIAGNOSIS — N179 Acute kidney failure, unspecified: Secondary | ICD-10-CM

## 2017-09-24 DIAGNOSIS — K5901 Slow transit constipation: Secondary | ICD-10-CM

## 2017-09-24 DIAGNOSIS — I9589 Other hypotension: Secondary | ICD-10-CM

## 2017-09-24 DIAGNOSIS — G308 Other Alzheimer's disease: Secondary | ICD-10-CM

## 2017-09-24 DIAGNOSIS — F0280 Dementia in other diseases classified elsewhere without behavioral disturbance: Secondary | ICD-10-CM

## 2017-09-24 DIAGNOSIS — N184 Chronic kidney disease, stage 4 (severe): Secondary | ICD-10-CM

## 2017-09-24 LAB — BASIC METABOLIC PANEL
Anion gap: 8 (ref 5–15)
BUN: 26 mg/dL — AB (ref 6–20)
CALCIUM: 8.4 mg/dL — AB (ref 8.9–10.3)
CHLORIDE: 106 mmol/L (ref 101–111)
CO2: 25 mmol/L (ref 22–32)
CREATININE: 2.49 mg/dL — AB (ref 0.44–1.00)
GFR, EST AFRICAN AMERICAN: 19 mL/min — AB (ref >60)
GFR, EST NON AFRICAN AMERICAN: 17 mL/min — AB (ref >60)
Glucose, Bld: 84 mg/dL (ref 65–99)
Potassium: 3.3 mmol/L — ABNORMAL LOW (ref 3.5–5.1)
SODIUM: 139 mmol/L (ref 135–145)

## 2017-09-24 LAB — CBC
HCT: 31.3 % — ABNORMAL LOW (ref 36.0–46.0)
Hemoglobin: 10 g/dL — ABNORMAL LOW (ref 12.0–15.0)
MCH: 28.2 pg (ref 26.0–34.0)
MCHC: 31.9 g/dL (ref 30.0–36.0)
MCV: 88.4 fL (ref 78.0–100.0)
PLATELETS: 249 10*3/uL (ref 150–400)
RBC: 3.54 MIL/uL — ABNORMAL LOW (ref 3.87–5.11)
RDW: 14.8 % (ref 11.5–15.5)
WBC: 5.9 10*3/uL (ref 4.0–10.5)

## 2017-09-24 MED ORDER — POTASSIUM CHLORIDE CRYS ER 20 MEQ PO TBCR
40.0000 meq | EXTENDED_RELEASE_TABLET | Freq: Once | ORAL | Status: AC
  Administered 2017-09-24: 40 meq via ORAL
  Filled 2017-09-24: qty 2

## 2017-09-24 MED ORDER — SODIUM CHLORIDE 0.9 % IV SOLN
INTRAVENOUS | Status: DC
  Administered 2017-09-24 – 2017-09-26 (×4): via INTRAVENOUS

## 2017-09-24 MED ORDER — FLEET ENEMA 7-19 GM/118ML RE ENEM
1.0000 | ENEMA | Freq: Once | RECTAL | Status: AC | PRN
  Administered 2017-09-25: 1 via RECTAL
  Filled 2017-09-24: qty 1

## 2017-09-24 MED ORDER — BISACODYL 10 MG RE SUPP
10.0000 mg | Freq: Every day | RECTAL | Status: DC | PRN
  Administered 2017-09-25: 10 mg via RECTAL
  Filled 2017-09-24: qty 1

## 2017-09-24 NOTE — Progress Notes (Signed)
PROGRESS NOTE  Tanya Smith ZOX:[id number] DOB: 11-01-1929 DOA: 09/22/2017 PCP: Lavone Orn, MD  HPI/Recap of past 24 hours: Tanya Smith is a 83 yo F from SNF with PMH significant for severe dementia, subdural hemorrhage, chronic diastolic CHF, CKD, gout, was brought in due to new onset seizures witnessed at SNF by daughter. Generalized tonic clonic x2. Recent admission at [facility] after hospitalization 2-3 weeks for constipation self reported by daughter and discharged 1 day prior to presentation.  No acute events overnight. Neurology following. Depakote 500 mg BID started and EEG ordered. CT head with no definite CT evidence of acute intracranial abnormalities.  Hypotensive overnight and responded well to IV fluid boluses. Worsening renal function. Continue IV fluid hydration. Avoid hypotension and nephrotoxic agents. Afebrile. No tachycardia/ tachypnea. No leukocytosis.  Patient seen and examined with her daughter at her bedside. Patient is somnolent. Baseline severe dementia. Unable to provide review of systems.  Assessment/Plan: Principal Problem:   Seizures ([place]) Active Problems:   Hypertension   Diabetes mellitus (HCC)   Dementia   Chronic kidney disease   Diastolic dysfunction   SDH (subdural hematoma) (HCC)  New onset seizures  -unclear source -no recurrence reported during this admission -Neurology following -depakote IV 500 mg BID -depakote level in 3 days -CMP am  AKI on ckd 3 -hypotensive overnight; avoid hypotension/nephrotoxic meds -cr 2.49 from 2.26 from 2.08 baseline cr 1.8 -IV fluid hydration -BMP am -monitor urine output -strict I&o's   Chronic constipation -xray showing retained stools -enema, lactulose, sennokot, reglan  Chronic diastolic CHF -appears stable -2D echo 12/24/12 LVEF 60-65% -allergic Ace I -BP is soft HR 60's 70's  HTN -Hypotension has resolved -Hold hctz due to AKI and hypotension -BP is soft -Allergic to Ace I  Severe  dementia -Reorient as needed -Fall precaution  Gout -No acute issues -hold allopurinol due to AKI and recent seizures  Anxiety/depression -escitalopram, mirtazapine -withdrawal sx if abrupt d/c of mirtazapine  Ambulatory dysfunction -PT to evaluate and treat   Code Status: Full  Family Communication: Daughter at her bedside. All questions answered to her satisfaction  Disposition Plan: Will stay another midnight to monitor symptoms. Started on new medication Depakote 09/23/17. Continue IV fluid hydration due to AKI.   Consultants:  Neurology  Procedures:  EEG  Antimicrobials:  No indicated at this time  DVT prophylaxis:  heparin 5000 U sq TID   Objective: Vitals:   09/23/17 2159 09/23/17 2314 09/24/17 0146 09/24/17 0643  BP: (!) 88/36 117/65 101/60 116/63  Pulse: (!) 56 64 67 68  Resp: 18 18 16 16   Temp: 97.9 F (36.6 C)  97.8 F (36.6 C) 98.3 F (36.8 C)  TempSrc: Axillary  Axillary Axillary  SpO2: 92% 93%  93%   No intake or output data in the 24 hours ending 09/24/17 0807 There were no vitals filed for this visit.  Exam:   General:  83 yo AAFD WD WN NAD. Somnolent. baseline severe dementia.   Cardiovascular: RRR with no rubs, gallops   Respiratory: CTA with no wheezes or rhonchi   Abdomen: Soft NT ND NBS x4  quadrants  Musculoskeletal: Moves all limbs   Skin: No noted open lesions  Psychiatry: Baseline severe dementia. Mood is appropriate for condition and setting.   Data Reviewed: CBC: Recent Labs  Lab 09/22/17 2017 09/23/17 0354 09/24/17 0402  WBC 5.0 5.9 5.9  NEUTROABS 3.0  --   --   HGB 12.1 10.6* 10.0*  HCT 37.6 33.5* 31.3*  MCV 89.3 90.1 88.4  PLT 278 285 010   Basic Metabolic Panel: Recent Labs  Lab 09/22/17 2017 09/23/17 0354 09/24/17 0402  NA 136 141 139  K 4.0 4.3 3.3*  CL 101 105 106  CO2 24 27 25   GLUCOSE 105* 71 84  BUN 22* 24* 26*  CREATININE 2.08* 2.26* 2.49*  CALCIUM 9.0 9.0 8.4*  MG 2.2  --   --     GFR: CrCl cannot be calculated (Unknown ideal weight.). Liver Function Tests: Recent Labs  Lab 09/22/17 2017  AST 30  ALT 14  ALKPHOS 92  BILITOT 0.3  PROT 7.1  ALBUMIN 3.1*   No results for input(s): LIPASE, AMYLASE in the last 168 hours. No results for input(s): AMMONIA in the last 168 hours. Coagulation Profile: No results for input(s): INR, PROTIME in the last 168 hours. Cardiac Enzymes: No results for input(s): CKTOTAL, CKMB, CKMBINDEX, TROPONINI in the last 168 hours. BNP (last 3 results) No results for input(s): PROBNP in the last 8760 hours. HbA1C: No results for input(s): HGBA1C in the last 72 hours. CBG: Recent Labs  Lab 09/23/17 0752  GLUCAP 87   Lipid Profile: No results for input(s): CHOL, HDL, LDLCALC, TRIG, CHOLHDL, LDLDIRECT in the last 72 hours. Thyroid Function Tests: No results for input(s): TSH, T4TOTAL, FREET4, T3FREE, THYROIDAB in the last 72 hours. Anemia Panel: No results for input(s): VITAMINB12, FOLATE, FERRITIN, TIBC, IRON, RETICCTPCT in the last 72 hours. Urine analysis:    Component Value Date/Time   COLORURINE YELLOW 09/23/2017 0849   APPEARANCEUR TURBID (A) 09/23/2017 0849   LABSPEC 1.021 09/23/2017 0849   PHURINE 9.0 (H) 09/23/2017 0849   GLUCOSEU NEGATIVE 09/23/2017 0849   HGBUR NEGATIVE 09/23/2017 0849   BILIRUBINUR NEGATIVE 09/23/2017 0849   KETONESUR NEGATIVE 09/23/2017 0849   PROTEINUR 100 (A) 09/23/2017 0849   UROBILINOGEN 0.2 06/14/2013 0356   NITRITE NEGATIVE 09/23/2017 0849   LEUKOCYTESUR LARGE (A) 09/23/2017 0849   Sepsis Labs: @LABRCNTIP (procalcitonin:4,lacticidven:4)  )No results found for this or any previous visit (from the past 240 hour(s)).    Studies: No results found.  Scheduled Meds: . bisacodyl  5 mg Oral QHS  . brimonidine  1 drop Both Eyes TID  . escitalopram  10 mg Oral Daily  . feeding supplement (ENSURE ENLIVE)  237 mL Oral TID  . fluticasone  1 spray Each Nare Daily  . gi cocktail  30 mL  Oral Once  . heparin subcutaneous  5,000 Units Subcutaneous Q8H  . lactulose  30 g Oral TID  . latanoprost  1 drop Both Eyes QHS  . magnesium oxide  400 mg Oral BID  . metoCLOPramide  5 mg Oral TID AC & HS  . mirtazapine  30 mg Oral QHS  . pantoprazole  40 mg Oral BID  . potassium chloride  20 mEq Oral Daily  . potassium chloride  40 mEq Oral Once  . senna-docusate  1 tablet Oral Daily    Continuous Infusions: . sodium chloride    . valproate sodium       LOS: 1 day     Tanya Memos, MD [facility] Pager [phone]  If 7PM-7AM, please contact night-coverage [email or web address] Password [email or web address] 09/24/2017, 8:07 AM

## 2017-09-24 NOTE — Progress Notes (Addendum)
Subjective: No further seizures overnight tolerating Depakote well.  Daughter tells me that patient does pocket her food and often times will spit it out if no is looking.  This goes for her pills in addition.  Exam: Vitals:   09/24/17 1010 09/24/17 1049  BP: 127/65 (!) 100/54  Pulse: 93 76  Resp: 19 18  Temp: (!) 101.6 F (38.7 C) 97.7 F (36.5 C)  SpO2: 97% 95%    HEENT-  Normocephalic, no lesions, without obvious abnormality.  Normal external eye and conjunctiva.  Normal TM's bilaterally.  Normal auditory canals and external ears. Normal external nose, mucus membranes and septum.  Normal pharynx. Cardiovascular- S1, S2 normal, pulses palpable throughout   Lungs- chest clear, no wheezing, rales, normal symmetric air entry    Neuro:  Patient is awake and again does not want to take part in exam nor take part with examiner.  17 of discussion was had with the daughter. CN:  Patient tracks me in the room, blinks to threat bilaterally, currently has grits with medications in her mouth which she is pocketing and looking for a place to spit the food out.  Patient's face appears symmetrical.  Extraocular muscles are intact. Motor:  Does not follow commands but she is moving all extremities antigravity spontaneously and purposefully Sensation: Withdraws from noxious stimuli    Medications:  Scheduled: . bisacodyl  5 mg Oral QHS  . brimonidine  1 drop Both Eyes TID  . escitalopram  10 mg Oral Daily  . feeding supplement (ENSURE ENLIVE)  237 mL Oral TID  . fluticasone  1 spray Each Nare Daily  . gi cocktail  30 mL Oral Once  . heparin subcutaneous  5,000 Units Subcutaneous Q8H  . lactulose  30 g Oral TID  . latanoprost  1 drop Both Eyes QHS  . magnesium oxide  400 mg Oral BID  . metoCLOPramide  5 mg Oral TID AC & HS  . mirtazapine  30 mg Oral QHS  . pantoprazole  40 mg Oral BID  . potassium chloride  20 mEq Oral Daily  . senna-docusate  1 tablet Oral Daily   Continuous: .  sodium chloride 75 mL/hr at 09/24/17 0815  . valproate sodium 500 mg (09/24/17 1000)   PIR:JJOACZYSAYTKZ **OR** acetaminophen, hydrALAZINE, ondansetron **OR** ondansetron (ZOFRAN) IV  Pertinent Labs/Diagnostics: EEG obtained yesterday showed mild diffuse slowing with no epileptiform activity  UA was turbid with large amounts of leukocytes, urinalysis showed many bacteria  Ct Head Wo Contrast  Result Date: 09/22/2017 CLINICAL DATA:  Altered level of consciousness EXAM: CT HEAD WITHOUT CONTRAST TECHNIQUE: Contiguous axial images were obtained from the base of the skull through the vertex without intravenous contrast. COMPARISON:  02/25/2017, 02/22/2017, 06/14/2013 FINDINGS: Brain: Motion degradation. No definite acute territorial infarction, hemorrhage or mass. Old left cerebellar infarct. Atrophy and moderate small vessel ischemic changes of the white matter. Probable old lacunar infarcts in the thalamus. Stable ventricle size. Old right temporal lobe infarct. Vascular: No hyperdense vessels.  Carotid vessel calcification Skull: No fracture Sinuses/Orbits: No acute finding. Other: None IMPRESSION: 1. Motion degraded study 2. No definite CT evidence for acute intracranial abnormality 3. Atrophy with small vessel ischemic changes of the white matter. Old right temporal and left cerebellar infarcts. Electronically Signed   By: Donavan Foil M.D.   On: 09/22/2017 21:23   Dg Abdomen Acute W/chest  Result Date: 09/22/2017 CLINICAL DATA:  83 y/o  F; intermittent chest pain. EXAM: DG ABDOMEN ACUTE W/ 1V CHEST  COMPARISON:  08/28/2017 chest radiograph. FINDINGS: Stable cardiomegaly. Aortic atherosclerosis with calcification. Hazy opacities at lung bases probably represent atelectasis. Rounded opacity arising from left hemidiaphragm. Bowel super position over the liver. The colon is mildly dilated with large volume of stool. Extensive degenerative changes of the spine. No acute osseous abnormality is evident.  IMPRESSION: 1. Hazy opacification of lung bases, probably atelectasis and small effusions. 2. Mild dilatation of the colon and large volume of stool, probably constipation/ fecal impaction. 3. Rounded opacity arising from left hemidiaphragm, question hiatal or diaphragmatic hernia. 4. Cardiomegaly and aortic atherosclerosis. Electronically Signed   By: Kristine Garbe M.D.   On: 09/22/2017 21:01     Tanya Quill PA-C [facility] [phone]  Impression: 83 year old female with advanced dementia and new onset seizures.  Given her advanced dementia and risk for seizures patient has been started on Depakote and has been handling the medication well.   Recommendations: 1) continue Depakote at 500 mg twice daily.  Facility will have to be alert as far as patient's pocketing her medications and possibly spitting them out. 2) patient will need a Depakote level in approximately 3-4 days 3) patient will need close follow-up with outpatient neurology.  This can be done by ordering a ambulatory neurology follow-up with [facility] or [facility]  09/24/2017, 12:24 PM  Attending Neurohospitalist Addendum D/W APP. Agree with plan. Outpatient f/u Depakote 500 BID Seizure precautions.  --- Amie Portland, MD [facility] [phone]  If 7pm to 7am, please call on call as listed on AMION.

## 2017-09-24 NOTE — Progress Notes (Signed)
Initial Nutrition Assessment  DOCUMENTATION CODES:   Not applicable  INTERVENTION:  Continue Ensure Enlive po TID, each supplement provides 350 kcal and 20 grams of protein.  Encourage adequate PO intake.   Recommend obtaining new weight to fully assess weight trends.  NUTRITION DIAGNOSIS:   Increased nutrient needs related to chronic illness as evidenced by estimated needs.  GOAL:   Patient will meet greater than or equal to 90% of their needs  MONITOR:   PO intake, Supplement acceptance, Weight trends, Labs, Skin, I & O's  REASON FOR ASSESSMENT:   Consult Assessment of nutrition requirement/status  ASSESSMENT:   83 yo F from SNF with PMH significant for severe dementia, subdural hemorrhage, chronic diastolic CHF, CKD, gout, was brought in due to new onset seizures witnessed at SNF by daughter. Generalized tonic clonic x2. Recent admission at [facility] after hospitalization 2-3 weeks for constipation self reported by daughter and discharged 1 day prior to presentation.  Daughter at bedside. Pt unable to respond to questions asked. Meal completion this AM at breakfast ~25%  Daughter reports she provides pt with whatever she would like to eat whenever she would like to eat to maximize PO intake. She reports pt would have her "good" and "bad" days when eating at meals. She reports she mostly eats small portions at meals, however unable to quantity amount eaten. Daughter reports pt additionally consume Ensure throughout the day, however unable to quantify amount as well. She does report pt with weight loss, however unable to report amount loss. Noted no new weight recorded. Recommend obtaining new weight to fully assess weight trends. Pt currently has Ensure ordered and has been consuming them.   NUTRITION - FOCUSED PHYSICAL EXAM:    Most Recent Value  Orbital Region  Unable to assess  Upper Arm Region  Mild depletion  Thoracic and Lumbar Region  No depletion  Buccal Region   Moderate depletion  Temple Region  Moderate depletion  Clavicle Bone Region  Severe depletion  Clavicle and Acromion Bone Region  Severe depletion  Scapular Bone Region  Unable to assess  Dorsal Hand  Unable to assess  Patellar Region  No depletion  Anterior Thigh Region  Mild depletion  Posterior Calf Region  No depletion  Edema (RD Assessment)  None  Hair  Reviewed  Eyes  Reviewed  Mouth  Reviewed  Skin  Reviewed  Nails  Reviewed     Fat depletion likely associated with the natural aging process. Unable to diagnosis malnutrition at this time.   Labs and medications reviewed.   Diet Order:  Diet Heart Room service appropriate? Yes; Fluid consistency: Thin  EDUCATION NEEDS:   Not appropriate for education at this time  Skin:  Skin Assessment: Reviewed RN Assessment  Last BM:  11/28  Height:   Ht Readings from Last 1 Encounters:  08/28/17 5\' 6"  (1.676 m)    Weight:   Wt Readings from Last 1 Encounters:  08/28/17 170 lb (77.1 kg)    Ideal Body Weight:  59 kg  BMI:  There is no height or weight on file to calculate BMI.  Estimated Nutritional Needs:   Kcal:  1650-1850  Protein:  75-85 grams  Fluid:  1.6-1.8 L/day    Corrin Parker, MS, RD, LDN Pager # [phone] After hours/ weekend pager # [phone]

## 2017-09-24 NOTE — Clinical Social Work Note (Signed)
Clinical Social Work Assessment  Patient Details  Name: Tanya Smith MRN: [id number] Date of Birth: [birth year]  Date of referral:  09/24/17               Reason for consult:  Facility Placement                Permission sought to share information with:  Facility Art therapist granted to share information::  No(pt disoriented- spoke with HCPOA)  Name::     Tanya Smith  Agency::  [facility]/ SNF  Relationship::  dtr  Contact Information:     Housing/Transportation Living arrangements for the past 2 months:  [place] of Information:  Adult Children Patient Interpreter Needed:  None Criminal Activity/Legal Involvement Pertinent to Current Situation/Hospitalization:  No - Comment as needed Significant Relationships:  Adult Children Lives with:  Facility Resident Do you feel safe going back to the place where you live?  Yes Need for family participation in patient care:  Yes (Comment)(decision making)  Care giving concerns:  Pt lives at ALF/memory care- dtr is concerned they are not capable of offering enough medical supervision given current medical concerns and recurrent issues.    Social Worker assessment / plan:  CSW spoke with pt dtr at bedside concerning DC plan.  Pt dtr is comfortable with [facility] except they have been unwilling to monitor patients bowel movements and patient has become impacted with serious issues recently.  CSW discussed LTC SNF and possible barriers to transitioning to this level of care and lack of options they would likely have.  Pt dtr possibly interested in [place] which is near home and which she used to work at.  Employment status:  Retired Forensic scientist:  [employer] PT Recommendations:  [name] / Referral to community resources:  [name]  Patient/Family's Response to care:  Pt dtr unsure what she wants to do at this time but agreeable to return to  [facility] if she has not made decision regarding SNF referral.  Patient/Family's Understanding of and Emotional Response to Diagnosis, Current Treatment, and Prognosis:  Pt dtr very well informed on pt condition- just worried that she will decline due to lack of medical supervision at ALF.  Emotional Assessment Appearance:  Appears stated age Attitude/Demeanor/Rapport:    Affect (typically observed):  Pleasant Orientation:  Fluctuating Orientation (Suspected and/or reported Sundowners)(disoriented x4) Alcohol / Substance use:  Not Applicable Psych involvement (Current and /or in the community):  No (Comment)  Discharge Needs  Concerns to be addressed:  Care Coordination Readmission within the last 30 days:  No Current discharge risk:  Physical Impairment, Chronically ill Barriers to Discharge:  Continued Medical Work up   Tanya Ny, LCSW 09/24/2017, 12:26 PM

## 2017-09-25 DIAGNOSIS — N183 Chronic kidney disease, stage 3 (moderate): Secondary | ICD-10-CM

## 2017-09-25 DIAGNOSIS — S065X9A Traumatic subdural hemorrhage with loss of consciousness of unspecified duration, initial encounter: Secondary | ICD-10-CM

## 2017-09-25 DIAGNOSIS — I10 Essential (primary) hypertension: Secondary | ICD-10-CM

## 2017-09-25 LAB — BASIC METABOLIC PANEL
ANION GAP: 8 (ref 5–15)
BUN: 23 mg/dL — AB (ref 6–20)
CO2: 23 mmol/L (ref 22–32)
Calcium: 8.6 mg/dL — ABNORMAL LOW (ref 8.9–10.3)
Chloride: 107 mmol/L (ref 101–111)
Creatinine, Ser: 2.12 mg/dL — ABNORMAL HIGH (ref 0.44–1.00)
GFR calc Af Amer: 23 mL/min — ABNORMAL LOW (ref >60)
GFR, EST NON AFRICAN AMERICAN: 20 mL/min — AB (ref >60)
Glucose, Bld: 83 mg/dL (ref 65–99)
POTASSIUM: 3.7 mmol/L (ref 3.5–5.1)
SODIUM: 138 mmol/L (ref 135–145)

## 2017-09-25 MED ORDER — DIVALPROEX SODIUM 500 MG PO DR TAB
500.0000 mg | DELAYED_RELEASE_TABLET | Freq: Two times a day (BID) | ORAL | Status: DC
  Administered 2017-09-25: 500 mg via ORAL
  Filled 2017-09-25 (×2): qty 1

## 2017-09-25 MED ORDER — SENNOSIDES-DOCUSATE SODIUM 8.6-50 MG PO TABS
1.0000 | ORAL_TABLET | Freq: Two times a day (BID) | ORAL | Status: DC
  Administered 2017-09-25 – 2017-09-27 (×4): 1 via ORAL
  Filled 2017-09-25 (×4): qty 1

## 2017-09-25 NOTE — Progress Notes (Addendum)
PROGRESS NOTE  Tanya Smith BPZ:[id number] DOB: 09-01-1930 DOA: 09/22/2017 PCP: Lavone Orn, MD  HPI/Recap of past 24 hours: Tanya Smith is a 83 yo F from SNF with PMH significant for severe dementia, subdural hemorrhage, chronic diastolic CHF, CKD, gout, was brought in due to new onset seizures witnessed at SNF by daughter. Generalized tonic clonic x2. Recent admission at [facility] after hospitalization 2-3 weeks for constipation self reported by daughter and discharged 1 day prior to presentation.  No acute events overnight. Neurology following. Depakote 500 mg BID.  Patient seen and examined at her bedside. Patient is somnolent. Baseline severe dementia. Unable to provide review of systems. Patient given enema this afternoon with some stool.  Assessment/Plan: Principal Problem:   Seizures ([place]) Active Problems:   Hypertension   Diabetes mellitus (HCC)   Dementia   Chronic kidney disease   Diastolic dysfunction   SDH (subdural hematoma) (HCC)  New onset seizures  -unclear source -no recurrence reported during this admission -Neurology following -depakote po 500 mg BID -depakote level in 3 days -CMP am  AKI on ckd 3 -hypotensive overnight; avoid hypotension/nephrotoxic meds -cr 2.12 from 2.49 from 2.26 from 2.08 baseline cr 1.8 -IV fluid hydration -monitor urine output -strict I&o's, daily weight   Chronic constipation -xray showing retained stools -enema, lactulose, sennokot bid, reglan -follow up with GI outpatient  Chronic diastolic CHF -appears stable -2D echo 12/24/12 LVEF 60-65% -allergic Ace I  HTN -Hypotension has resolved -Hold hctz due to AKI and recent hypotension -Allergic to Ace I  Severe dementia -Reorient as needed -Fall precaution in place  Gout -No acute issues -hold allopurinol due to AKI and recent seizures  Anxiety/depression -escitalopram, mirtazapine -withdrawal sx if abrupt d/c of mirtazapine  Ambulatory dysfunction -PT to evaluate  and treat -PT recommends SNF   Code Status: Full  Family Communication: Daughter 09/25/17 phone conversation while in the patient's room.  Disposition Plan: Will stay another midnight to monitor symptoms, to induce bowel movement. Started on new medication Depakote 09/23/17. Continue IV fluid hydration due to AKI. Possible discharge tomorrow to SNF.   Consultants:  Neurology  Procedures:  EEG  Antimicrobials:  Not indicated at this time  DVT prophylaxis:  heparin 5000 U sq TID   Objective: Vitals:   09/25/17 0558 09/25/17 0952 09/25/17 1433 09/25/17 1719  BP: (!) 170/81 (!) 154/68 (!) 163/73 (!) 159/97  Pulse: 65 61 67 63  Resp: 18 16 16 16   Temp: 98.3 F (36.8 C) 98.3 F (36.8 C) 98 F (36.7 C) 97.6 F (36.4 C)  TempSrc: Oral Oral Axillary Oral  SpO2: 100% 100% 100% 100%    Intake/Output Summary (Last 24 hours) at 09/25/2017 1812 Last data filed at 09/25/2017 1434 Gross per 24 hour  Intake 1000 ml  Output 1000 ml  Net 0 ml   There were no vitals filed for this visit.  Exam:   General:  83 yo AAFD WD WN NAD. Somnolent. baseline severe dementia.   Cardiovascular: RRR with no rubs, gallops   Respiratory: CTA with no wheezes or rhonchi   Abdomen: Soft NT ND NBS x4  quadrants  Musculoskeletal: Moves all limbs   Skin: No noted open lesions  Psychiatry: Baseline severe dementia. Mood is appropriate for condition and setting.   Data Reviewed: CBC: Recent Labs  Lab 09/22/17 2017 09/23/17 0354 09/24/17 0402  WBC 5.0 5.9 5.9  NEUTROABS 3.0  --   --   HGB 12.1 10.6* 10.0*  HCT 37.6 33.5* 31.3*  MCV 89.3 90.1 88.4  PLT 278 285 099   Basic Metabolic Panel: Recent Labs  Lab 09/22/17 2017 09/23/17 0354 09/24/17 0402 09/25/17 1349  NA 136 141 139 138  K 4.0 4.3 3.3* 3.7  CL 101 105 106 107  CO2 24 27 25 23   GLUCOSE 105* 71 84 83  BUN 22* 24* 26* 23*  CREATININE 2.08* 2.26* 2.49* 2.12*  CALCIUM 9.0 9.0 8.4* 8.6*  MG 2.2  --   --   --      GFR: CrCl cannot be calculated (Unknown ideal weight.). Liver Function Tests: Recent Labs  Lab 09/22/17 2017  AST 30  ALT 14  ALKPHOS 92  BILITOT 0.3  PROT 7.1  ALBUMIN 3.1*   No results for input(s): LIPASE, AMYLASE in the last 168 hours. No results for input(s): AMMONIA in the last 168 hours. Coagulation Profile: No results for input(s): INR, PROTIME in the last 168 hours. Cardiac Enzymes: No results for input(s): CKTOTAL, CKMB, CKMBINDEX, TROPONINI in the last 168 hours. BNP (last 3 results) No results for input(s): PROBNP in the last 8760 hours. HbA1C: No results for input(s): HGBA1C in the last 72 hours. CBG: Recent Labs  Lab 09/23/17 0752  GLUCAP 87   Lipid Profile: No results for input(s): CHOL, HDL, LDLCALC, TRIG, CHOLHDL, LDLDIRECT in the last 72 hours. Thyroid Function Tests: No results for input(s): TSH, T4TOTAL, FREET4, T3FREE, THYROIDAB in the last 72 hours. Anemia Panel: No results for input(s): VITAMINB12, FOLATE, FERRITIN, TIBC, IRON, RETICCTPCT in the last 72 hours. Urine analysis:    Component Value Date/Time   COLORURINE YELLOW 09/23/2017 0849   APPEARANCEUR TURBID (A) 09/23/2017 0849   LABSPEC 1.021 09/23/2017 0849   PHURINE 9.0 (H) 09/23/2017 0849   GLUCOSEU NEGATIVE 09/23/2017 0849   HGBUR NEGATIVE 09/23/2017 0849   BILIRUBINUR NEGATIVE 09/23/2017 0849   KETONESUR NEGATIVE 09/23/2017 0849   PROTEINUR 100 (A) 09/23/2017 0849   UROBILINOGEN 0.2 06/14/2013 0356   NITRITE NEGATIVE 09/23/2017 0849   LEUKOCYTESUR LARGE (A) 09/23/2017 0849   Sepsis Labs: @LABRCNTIP (procalcitonin:4,lacticidven:4)  )No results found for this or any previous visit (from the past 240 hour(s)).    Studies: No results found.  Scheduled Meds: . brimonidine  1 drop Both Eyes TID  . escitalopram  10 mg Oral Daily  . feeding supplement (ENSURE ENLIVE)  237 mL Oral TID  . fluticasone  1 spray Each Nare Daily  . gi cocktail  30 mL Oral Once  . heparin  subcutaneous  5,000 Units Subcutaneous Q8H  . lactulose  30 g Oral TID  . latanoprost  1 drop Both Eyes QHS  . magnesium oxide  400 mg Oral BID  . metoCLOPramide  5 mg Oral TID AC & HS  . mirtazapine  30 mg Oral QHS  . pantoprazole  40 mg Oral BID  . potassium chloride  20 mEq Oral Daily  . senna-docusate  1 tablet Oral BID    Continuous Infusions: . sodium chloride 75 mL/hr at 09/25/17 1716  . valproate sodium Stopped (09/25/17 1042)     LOS: 2 days     Kayleen Memos, MD [facility] Pager [phone]  If 7PM-7AM, please contact night-coverage [email or web address] Password [email or web address] 09/25/2017, 6:12 PM

## 2017-09-25 NOTE — Care Management Note (Signed)
Case Management Note  Patient Details  Name: Tanya Smith MRN: [id number] Date of Birth: 01-28-1930  Subjective/Objective:  Pt in with seizures. She is from [facility] care.                 Action/Plan: PT recommending SNF. CSW aware. CM following for d/c disposition.  Expected Discharge Date:                  Expected Discharge Plan:  Skilled Nursing Facility  In-House Referral:  Clinical Social Work  Discharge planning Services     Post Acute Care Choice:    Choice offered to:     DME Arranged:    DME Agency:     HH Arranged:    [place] Agency:     Status of Service:  In process, will continue to follow  If discussed at Long Length of Stay Meetings, dates discussed:    Additional Comments:  Pollie Friar, RN 09/25/2017, 11:59 AM

## 2017-09-25 NOTE — Progress Notes (Addendum)
Telemetry has been off since 1st shift 09/24/17 per 1st shift RN- pt. pulling off. Pt. Refusing some of her po meds- Protonix, Magnesium oxide, Lactulose, and Ensure

## 2017-09-25 NOTE — Consult Note (Signed)
   [facility] CM Inpatient Consult   09/25/2017  FOLASADE MOOTY 06/10/1930 [id number]   Patient screened for potential [facility] Care Management services.   Spoke with inpatient RNCM. Patient is from and lives at [facility]. She is a long term resident there.   Discussed that there are no identifiable [facility] Care Management needs at this time.    Marthenia Rolling, MSN-Ed, RN,BSN [facility] Liaison [phone]

## 2017-09-25 NOTE — Plan of Care (Signed)
  Education: Knowledge of General Education information will improve 09/25/2017 0515 - Progressing by Anson Fret, RN Note POC reviewed with pt.- unsure how much she understands.

## 2017-09-25 NOTE — Progress Notes (Signed)
Paged Hospitalist for telesitter- no sitter available this am.

## 2017-09-26 LAB — BASIC METABOLIC PANEL
Anion gap: 9 (ref 5–15)
BUN: 21 mg/dL — AB (ref 6–20)
CO2: 23 mmol/L (ref 22–32)
CREATININE: 1.82 mg/dL — AB (ref 0.44–1.00)
Calcium: 8.5 mg/dL — ABNORMAL LOW (ref 8.9–10.3)
Chloride: 108 mmol/L (ref 101–111)
GFR calc Af Amer: 28 mL/min — ABNORMAL LOW (ref >60)
GFR, EST NON AFRICAN AMERICAN: 24 mL/min — AB (ref >60)
GLUCOSE: 67 mg/dL (ref 65–99)
POTASSIUM: 4.1 mmol/L (ref 3.5–5.1)
Sodium: 140 mmol/L (ref 135–145)

## 2017-09-26 MED ORDER — CIPROFLOXACIN HCL 500 MG PO TABS: 500.0000 mg | ORAL_TABLET | Freq: Every day | ORAL | 0 refills | Status: DC

## 2017-09-26 MED ORDER — VALPROATE SODIUM 250 MG/5ML PO SOLN
250.0000 mg | Freq: Four times a day (QID) | ORAL | Status: DC
  Administered 2017-09-26 – 2017-09-27 (×4): 250 mg via ORAL
  Filled 2017-09-26 (×5): qty 5

## 2017-09-26 MED ORDER — PANTOPRAZOLE SODIUM 40 MG PO PACK
40.0000 mg | PACK | Freq: Two times a day (BID) | ORAL | Status: DC
  Administered 2017-09-26 (×2): 40 mg via ORAL
  Filled 2017-09-26: qty 20

## 2017-09-26 MED ORDER — OMEPRAZOLE MAGNESIUM 20 MG PO TBEC: 20.0000 mg | DELAYED_RELEASE_TABLET | Freq: Every day | ORAL | 1 refills | Status: DC

## 2017-09-26 MED ORDER — CIPROFLOXACIN HCL 500 MG PO TABS
500.0000 mg | ORAL_TABLET | Freq: Every day | ORAL | Status: DC
  Administered 2017-09-26 – 2017-09-27 (×2): 500 mg via ORAL
  Filled 2017-09-26 (×2): qty 1

## 2017-09-26 NOTE — Clinical Social Work Note (Addendum)
Patient's daughter wants her to return to [facility] until they are researched SNF's further. CSW provided SNF list for review. CSW left voicemail at facility and spoke with clinical liaison for that company. She will reach out to their regional manager regarding patient returning. MD notified.  Dayton Scrape, [place]  1:46 pm [facility] cannot take patient back and then easily discharge her to [facility] on Monday. They said the patient's PCP would have to complete a new FL2 and it would be a longer process before she could go to skilled. Patient's family concerned that she really needs a higher level of care than the ALF can provide. Patient's daughter is agreeable to discontinuing sitter and states she will stay with her if getting her into SNF is what needs to be done. [facility] admissions coordinator will be at the facility at 8:00 am to facilitate getting her there tomorrow. MD aware and agreeable.  Dayton Scrape, [place]

## 2017-09-26 NOTE — Discharge Summary (Addendum)
Discharge Summary  Tanya Smith ZOX:[id number] DOB: [birth year]  PCP: Lavone Orn, MD  Admit date: 09/22/2017 Discharge date: 09/26/2017  Time spent: 25 minutes  Addendum: the patient's daughter reconsidered her decision to have the patient transferred to assisted living memory care. Patient's discharge postponed until tomorrow 09/27/17.  Recommendations for Outpatient Follow-up:  1. Follow up with PCP within a week 2. Follow up with GI within a week 3. Take your medications as prescribed 4. Continue PT as tolerated 5. Fall precaution  6. PT and provider recommend SNF however, patient daughter declines SNF.  Discharge Diagnoses:  Active Hospital Problems   Diagnosis Date Noted  . Seizures ([place]) 09/22/2017  . SDH (subdural hematoma) ([place]) 02/22/2017  . Hypertension 09/30/2012  . Diastolic dysfunction 81/19/1478  . Dementia 09/30/2012  . Diabetes mellitus ([place]) 09/30/2012  . Chronic kidney disease 09/30/2012    Resolved Hospital Problems  No resolved problems to display.    Discharge Condition: Stable  Diet recommendation: Resume previous diet  Vitals:   09/26/17 0522 09/26/17 1024  BP: (!) 114/42 138/69  Pulse: (!) 53 80  Resp: 18 18  Temp: 97.7 F (36.5 C) 98.6 F (37 C)  SpO2: 100% 100%    History of present illness:  Tanya Smith is a 83 yo F from memory facility, assisted living with PMH significant for severe dementia, subdural hemorrhage, chronic diastolic CHF, CKD, gout, was brought in due to new onset seizures witnessed at memory facility by daughter. Generalized tonic clonic x 2. Recent admission at [facility] after hospitalization 2-3 weeks for constipation self reported by daughter and discharged 1 day prior to presentation.  Neurology following. Depakote 500 mg BID liquid form due to the patient holding food in her mouth/spitting out her pills.  CT abdomen/pelvis revealed retained stools. Fleet enema given yesterday with fecal matter expelled.  Positive  U/A 09/23/17 appears asymptomatic. No leukocytosis or sign of active infective process. Will give po cipro 500 mg daily x 3 days.  AKI on CKD 3 resolved. Cr 2.49 on presentation, 1.82 (09/26/17) at baseline.  On the day of discharge the patient was hemodynamically stable. She has a baseline severe dementia. Did not appear in any distress.   Hospital Course:  Principal Problem:   Seizures ([place]) Active Problems:   Hypertension   Diabetes mellitus ([place])   Dementia   Chronic kidney disease   Diastolic dysfunction   SDH (subdural hematoma) (HCC)   New onset seizures -unclear source -no recurrence reported during this admission -Neurology following -depakote po 500 mg BID liquid form as the patient holds pills or spit them out. -depakote level on Monday 09/28/17  AKI on ckd 3, resolved -Most likely multifactorial 2/2 to dehydration vs others -avoid hypotension/nephrotoxic meds -cr. 1.8 from cr 2.12 from 2.49 from 2.26 from 2.08  -baseline cr 1.8 -stay hydrated  Chronic constipation -xray showing retained stools 09/22/17 -enema, lactulose, sennokot bid, reglan -follow up with GI outpatient  Chronic diastolic CHF -appears stable -2D echo 12/24/12 LVEF 60-65% -allergic Ace I  HTN -Hypotension has resolved -Hold hctz due to AKI and recent hypotension -Allergic to Ace I  Severe dementia -Reorient as needed -Fall precaution  Gout -No acute issues -hold allopurinol due to AKI and recent seizures  Anxiety/depression -escitalopram, mirtazapine -withdrawal sx if abrupt d/c of mirtazapine  Ambulatory dysfunction -PT to evaluate and treat -PT recommends SNF   Procedures:  None  Consultations:  None  Discharge Exam: BP 138/69 (BP Location: Right Arm)   Pulse 80  Temp 98.6 F (37 C) (Oral)   Resp 18   SpO2 100%    General:  83 yo AAFD WD WN NAD. Somnolent. baseline severe dementia.   Cardiovascular: RRR with no rubs, gallops   Respiratory:  CTA with no wheezes or rhonchi   Abdomen: Soft NT ND NBS x4  quadrants  Musculoskeletal: Moves all limbs   Skin: No noted open lesions  Psychiatry: Baseline severe dementia. Mood is appropriate for condition and setting.  Discharge Instructions You were cared for by a hospitalist during your hospital stay. If you have any questions about your discharge medications or the care you received while you were in the hospital after you are discharged, you can call the unit and asked to speak with the hospitalist on call if the hospitalist that took care of you is not available. Once you are discharged, your primary care physician will handle any further medical issues. Please note that NO REFILLS for any discharge medications will be authorized once you are discharged, as it is imperative that you return to your primary care physician (or establish a relationship with a primary care physician if you do not have one) for your aftercare needs so that they can reassess your need for medications and monitor your lab values.   Allergies as of 09/26/2017      Reactions   Ace Inhibitors Other (See Comments)   Furosemide Other (See Comments)   Unknown. Per MAR. Can take brand name Lasix.    Sertraline Other (See Comments)   Unknown; per physician's orders    Latex Rash   Other Rash   Soap & deodorant.   Tape Rash      Medication List    STOP taking these medications   allopurinol 300 MG tablet Commonly known as:  ZYLOPRIM   ibuprofen 400 MG tablet Commonly known as:  ADVIL,MOTRIN   PROTONIX PO     TAKE these medications   acetaminophen 500 MG tablet Commonly known as:  TYLENOL Take 500 mg by mouth every 4 (four) hours as needed for pain or fever.   bisacodyl 5 MG EC tablet Generic drug:  bisacodyl Take 5 mg by mouth at bedtime.   brimonidine 0.1 % Soln Commonly known as:  ALPHAGAN P Place 1 drop into both eyes daily.   cetirizine 10 MG tablet Commonly known as:  ZYRTEC Take 10  mg by mouth daily.   ciprofloxacin 500 MG tablet Commonly known as:  CIPRO Take 1 tablet (500 mg total) by mouth daily with breakfast. Start taking on:  09/27/2017   escitalopram 5 MG tablet Commonly known as:  LEXAPRO Take 10 mg by mouth daily.   fluconazole 150 MG tablet Commonly known as:  DIFLUCAN Take 1 tablet by mouth on first day of antibiotic and then take 1 tablet by mouth on last day of antibiotic   fluticasone 50 MCG/ACT nasal spray Commonly known as:  FLONASE Place 1 spray into both nostrils daily.   furosemide 20 MG tablet Commonly known as:  LASIX Take 1 tablet (20 mg total) by mouth daily. x3 days starting 08/19/17   hydrochlorothiazide 12.5 MG tablet Commonly known as:  HYDRODIURIL Take 1 tablet (12.5 mg total) by mouth daily. What changed:  how much to take   lactulose 10 GM/15ML solution Commonly known as:  CHRONULAC Take 30 g by mouth 3 (three) times daily.   magnesium hydroxide 400 MG/5ML suspension Commonly known as:  MILK OF MAGNESIA Take 30 mLs by mouth at  bedtime as needed for constipation.   magnesium oxide 400 MG tablet Commonly known as:  MAG-OX Take 400 mg by mouth 2 (two) times daily.   metoCLOPramide 5 MG/5ML solution Commonly known as:  REGLAN Take 5 mg by mouth 4 (four) times daily.   mirtazapine 30 MG tablet Commonly known as:  REMERON Take 30 mg by mouth at bedtime.   NUTRITIONAL DRINK Liqd Take 1 Container by mouth 3 (three) times daily. 0800, 1400, 2000   omeprazole 20 MG tablet Commonly known as:  PRILOSEC OTC Take 1 tablet (20 mg total) by mouth daily.   potassium chloride 20 MEQ packet Commonly known as:  KLOR-CON Take 20 mEq by mouth daily.   senna-docusate 8.6-50 MG tablet Commonly known as:  Senokot-S Take 1 tablet by mouth daily.   Travoprost (BAK Free) 0.004 % Soln ophthalmic solution Commonly known as:  TRAVATAN Place 1 drop into both eyes daily.      Allergies  Allergen Reactions  . Ace Inhibitors  Other (See Comments)  . Furosemide Other (See Comments)    Unknown. Per MAR. Can take brand name Lasix.   . Sertraline Other (See Comments)    Unknown; per physician's orders   . Latex Rash  . Other Rash    Soap & deodorant.  . Tape Rash   Follow-up Information    Lavone Orn, MD Follow up.   Specialty:  Internal Medicine Contact information: [address], [address] [place] [address] [phone]            The results of significant diagnostics from this hospitalization (including imaging, microbiology, ancillary and laboratory) are listed below for reference.    Significant Diagnostic Studies: Ct Head Wo Contrast  Result Date: 09/22/2017 CLINICAL DATA:  Altered level of consciousness EXAM: CT HEAD WITHOUT CONTRAST TECHNIQUE: Contiguous axial images were obtained from the base of the skull through the vertex without intravenous contrast. COMPARISON:  02/25/2017, 02/22/2017, 06/14/2013 FINDINGS: Brain: Motion degradation. No definite acute territorial infarction, hemorrhage or mass. Old left cerebellar infarct. Atrophy and moderate small vessel ischemic changes of the white matter. Probable old lacunar infarcts in the thalamus. Stable ventricle size. Old right temporal lobe infarct. Vascular: No hyperdense vessels.  Carotid vessel calcification Skull: No fracture Sinuses/Orbits: No acute finding. Other: None IMPRESSION: 1. Motion degraded study 2. No definite CT evidence for acute intracranial abnormality 3. Atrophy with small vessel ischemic changes of the white matter. Old right temporal and left cerebellar infarcts. Electronically Signed   By: Donavan Foil M.D.   On: 09/22/2017 21:23   Dg Chest Port 1 View  Result Date: 08/28/2017 CLINICAL DATA:  83 year old female with chest pain. EXAM: PORTABLE CHEST 1 VIEW COMPARISON:  Chest radiograph dated 08/18/2017 FINDINGS: There is stable cardiomegaly. Mild central vascular prominence. There is no focal consolidation,  pleural effusion, or pneumothorax. There is atherosclerotic calcification of the aortic arch. A hiatal hernia may be present. There is osteopenia with degenerative changes of the spine and right shoulder. No acute osseous pathology. IMPRESSION: Cardiomegaly with possible mild vascular congestion. No focal consolidation. Electronically Signed   By: Anner Crete M.D.   On: 08/28/2017 23:49   Dg Abdomen Acute W/chest  Result Date: 09/22/2017 CLINICAL DATA:  83 y/o  F; intermittent chest pain. EXAM: DG ABDOMEN ACUTE W/ 1V CHEST COMPARISON:  08/28/2017 chest radiograph. FINDINGS: Stable cardiomegaly. Aortic atherosclerosis with calcification. Hazy opacities at lung bases probably represent atelectasis. Rounded opacity arising from left hemidiaphragm. Bowel super position over  the liver. The colon is mildly dilated with large volume of stool. Extensive degenerative changes of the spine. No acute osseous abnormality is evident. IMPRESSION: 1. Hazy opacification of lung bases, probably atelectasis and small effusions. 2. Mild dilatation of the colon and large volume of stool, probably constipation/ fecal impaction. 3. Rounded opacity arising from left hemidiaphragm, question hiatal or diaphragmatic hernia. 4. Cardiomegaly and aortic atherosclerosis. Electronically Signed   By: Kristine Garbe M.D.   On: 09/22/2017 21:01    Microbiology: No results found for this or any previous visit (from the past 240 hour(s)).   Labs: Basic Metabolic Panel: Recent Labs  Lab 09/22/17 2017 09/23/17 0354 09/24/17 0402 09/25/17 1349 09/26/17 0617  NA 136 141 139 138 140  K 4.0 4.3 3.3* 3.7 4.1  CL 101 105 106 107 108  CO2 24 27 25 23 23   GLUCOSE 105* 71 84 83 67  BUN 22* 24* 26* 23* 21*  CREATININE 2.08* 2.26* 2.49* 2.12* 1.82*  CALCIUM 9.0 9.0 8.4* 8.6* 8.5*  MG 2.2  --   --   --   --    Liver Function Tests: Recent Labs  Lab 09/22/17 2017  AST 30  ALT 14  ALKPHOS 92  BILITOT 0.3  PROT 7.1    ALBUMIN 3.1*   No results for input(s): LIPASE, AMYLASE in the last 168 hours. No results for input(s): AMMONIA in the last 168 hours. CBC: Recent Labs  Lab 09/22/17 2017 09/23/17 0354 09/24/17 0402  WBC 5.0 5.9 5.9  NEUTROABS 3.0  --   --   HGB 12.1 10.6* 10.0*  HCT 37.6 33.5* 31.3*  MCV 89.3 90.1 88.4  PLT 278 285 249   Cardiac Enzymes: No results for input(s): CKTOTAL, CKMB, CKMBINDEX, TROPONINI in the last 168 hours. BNP: BNP (last 3 results) Recent Labs    08/18/17 1302  BNP 314.2*    ProBNP (last 3 results) No results for input(s): PROBNP in the last 8760 hours.  CBG: Recent Labs  Lab 09/23/17 0752  GLUCAP 87       Signed:  Kayleen Memos, MD [facility] 09/26/2017, 12:41 PM

## 2017-09-26 NOTE — NC FL2 (Signed)
[place] LEVEL OF CARE SCREENING TOOL     IDENTIFICATION  Patient Name: Tanya Smith Birthdate: Aug 14, 1930 Sex: female Admission Date (Current Location): 09/22/2017  [facility] and [place] Number:  Herbalist and Address:  The [place]. [facility], [place] [address], [place], [place] [address]      Provider Number: [id number]  Attending Physician Name and Address:  Kayleen Memos, DO  Relative Name and Phone Number:       Current Level of Care: Hospital Recommended Level of Care: [place] Prior Approval Number:    Date Approved/Denied:   PASRR Number: [id number]  Discharge Plan: SNF    Current Diagnoses: Patient Active Problem List   Diagnosis Date Noted  . Seizures ([place]) 09/22/2017  . SDH (subdural hematoma) ([place]) 02/22/2017  . Moderate dementia 05/08/2014  . Chest pain 12/23/2012  . Hypokalemia 12/23/2012  . Hypertension 09/30/2012  . Diabetes mellitus ([place]) 09/30/2012  . Dehydration 09/30/2012  . Weakness generalized 09/30/2012  . Acute kidney injury ([place]) 09/30/2012  . UTI (urinary tract infection) 09/30/2012  . Candidiasis of vagina 09/30/2012  . Dementia 09/30/2012  . Chronic kidney disease 09/30/2012  . Diastolic dysfunction 66/59/9357    Orientation RESPIRATION BLADDER Height & Weight    Oriented to self    Normal Incontinent, External catheter Weight:  170 lb Height:   5\' 6"   BEHAVIORAL SYMPTOMS/MOOD NEUROLOGICAL BOWEL NUTRITION STATUS   Move around a lot  Seizures, dementia Continent Heart healthy  AMBULATORY STATUS COMMUNICATION OF NEEDS Skin   Limited Assist Verbally Normal                       Personal Care Assistance Level of Assistance  Bathing, Dressing Bathing Assistance: Limited assistance   Dressing Assistance: Limited assistance     Functional Limitations Info             SPECIAL CARE FACTORS FREQUENCY  PT (By licensed PT)     PT Frequency: 5 x week              Contractures      Additional Factors Info  Code Status, Allergies Code Status Info: FULL Allergies Info: Ace Inhibitors, Furosemide, Sertraline, Latex, Other, Tape           Current Medications (09/26/2017):  This is the current hospital active medication list Current Facility-Administered Medications  Medication Dose Route Frequency Provider Last Rate Last Dose  . 0.9 %  sodium chloride infusion   Intravenous Continuous Kayleen Memos, DO 75 mL/hr at 09/26/17 0540    . acetaminophen (TYLENOL) tablet 650 mg  650 mg Oral Q6H PRN Rise Patience, MD       Or  . acetaminophen (TYLENOL) suppository 650 mg  650 mg Rectal Q6H PRN Rise Patience, MD      . bisacodyl (DULCOLAX) suppository 10 mg  10 mg Rectal Daily PRN Schorr, Rhetta Mura, NP   10 mg at 09/25/17 1224  . brimonidine (ALPHAGAN) 0.15 % ophthalmic solution 1 drop  1 drop Both Eyes TID Rise Patience, MD   1 drop at 09/26/17 1028  . ciprofloxacin (CIPRO) tablet 500 mg  500 mg Oral Q breakfast Irene Pap N, DO   500 mg at 09/26/17 0820  . escitalopram (LEXAPRO) tablet 10 mg  10 mg Oral Daily Rise Patience, MD   10 mg at 09/26/17 1025  . feeding supplement (ENSURE ENLIVE) (ENSURE ENLIVE) liquid 237  mL  237 mL Oral TID Rise Patience, MD   237 mL at 09/26/17 0840  . fluticasone (FLONASE) 50 MCG/ACT nasal spray 1 spray  1 spray Each Nare Daily Rise Patience, MD   1 spray at 09/26/17 1028  . gi cocktail (Maalox,Lidocaine,Donnatal)  30 mL Oral Once Irene Pap N, DO      . heparin injection 5,000 Units  5,000 Units Subcutaneous Q8H Irene Pap N, DO   5,000 Units at 09/26/17 4287  . hydrALAZINE (APRESOLINE) injection 10 mg  10 mg Intravenous Q4H PRN Rise Patience, MD      . lactulose (CHRONULAC) 10 GM/15ML solution 30 g  30 g Oral TID Rise Patience, MD   30 g at 09/26/17 1027  . latanoprost (XALATAN) 0.005 % ophthalmic solution 1 drop  1 drop Both Eyes QHS Rise Patience, MD    1 drop at 09/25/17 2244  . magnesium oxide (MAG-OX) tablet 400 mg  400 mg Oral BID Rise Patience, MD   400 mg at 09/26/17 1026  . metoCLOPramide (REGLAN) tablet 5 mg  5 mg Oral TID AC & HS Rise Patience, MD   5 mg at 09/26/17 0820  . mirtazapine (REMERON) tablet 30 mg  30 mg Oral QHS Rise Patience, MD   30 mg at 09/25/17 2234  . ondansetron (ZOFRAN) tablet 4 mg  4 mg Oral Q6H PRN Rise Patience, MD       Or  . ondansetron [facility]) injection 4 mg  4 mg Intravenous Q6H PRN Rise Patience, MD      . pantoprazole sodium (PROTONIX) 40 mg/20 mL oral suspension 40 mg  40 mg Oral BID Rise Patience, MD   40 mg at 09/26/17 1343  . potassium chloride (K-DUR,KLOR-CON) CR tablet 20 mEq  20 mEq Oral Daily Rise Patience, MD   20 mEq at 09/26/17 1026  . senna-docusate (Senokot-S) tablet 1 tablet  1 tablet Oral BID Irene Pap N, DO   1 tablet at 09/26/17 1026  . Valproate Sodium (DEPAKENE) solution 250 mg  250 mg Oral Q6H Hall, Carole N, DO   250 mg at 09/26/17 1343     Discharge Medications: Please see discharge summary for a list of discharge medications.  Relevant Imaging Results:  Relevant Lab Results:   Additional Information SS#: [id number]  Candie Chroman, LCSW

## 2017-09-26 NOTE — Progress Notes (Signed)
Pt had pulled telemetry box off, pt was going to d/c today, now tomorrow (see SW notes).    Pt keeps pulling telemetry box off, contacted MD x2 to request for d/c telemetry order, and d/c IVF, daughter at bedside and assisting with po intake and meds as pt will take.  MD responded back and telemetry d/c'd--CMT [place] notified, IVF line d/c'd, NSL.

## 2017-09-26 NOTE — Discharge Instructions (Signed)
Chronic Kidney Disease, Adult Chronic kidney disease (CKD) happens when the kidneys are damaged during a time of 3 or more months. The kidneys are two organs that do many important jobs in the body. These jobs include:  Removing wastes and extra fluids from the blood.  Making hormones that maintain the amount of fluid in your tissues and blood vessels.  Making sure that the body has the right amount of fluids and chemicals.  Most of the time, this condition does not go away, but it can usually be controlled. Steps must be taken to slow down the kidney damage or stop it from getting worse. Otherwise, the kidneys may stop working. Follow these instructions at home:  Follow your diet as told by your doctor. You may need to avoid alcohol, salty foods (sodium), and foods that are high in potassium, calcium, and protein.  Take over-the-counter and prescription medicines only as told by your doctor. Do not take any new medicines unless your doctor says you can do that. These include vitamins and minerals. ? Medicines and nutritional supplements can make kidney damage worse. ? Your doctor may need to change how much medicine you take.  Do not use any tobacco products. These include cigarettes, chewing tobacco, and e-cigarettes. If you need help quitting, ask your doctor.  Keep all follow-up visits as told by your doctor. This is important.  Check your blood pressure. Tell your doctor if there are changes to your blood pressure.  Get to a healthy weight. Stay at that weight. If you need help with this, ask your doctor.  Start or continue an exercise plan. Try to exercise at least 30 minutes a day, 5 days a week.  Stay up-to-date with your shots (immunizations) as told by your doctor. Contact a doctor if:  Your symptoms get worse.  You have new symptoms. Get help right away if:  You have symptoms of end-stage kidney disease. These include: ? Headaches. ? Skin that is darker or lighter  than normal. ? Numbness in your hands or feet. ? Easy bruising. ? Having hiccups often. ? Chest pain. ? Shortness of breath. ? Stopping of menstrual periods in women.  You have a fever.  You are making very little pee (urine).  You have pain or bleeding when you pee (urinate). This information is not intended to replace advice given to you by your health care provider. Make sure you discuss any questions you have with your health care provider. Document Released: 01/07/2010 Document Revised: 03/20/2016 Document Reviewed: 06/11/2012 Elsevier Interactive Patient Education  2017 Elsevier Inc.  Acute Kidney Injury, Adult Acute kidney injury is a sudden worsening of kidney function. The kidneys are organs that have several jobs. They filter the blood to remove waste products and extra fluid. They also maintain a healthy balance of minerals and hormones in the body, which helps control blood pressure and keep bones strong. With this condition, your kidneys do not do their jobs as well as they should. This condition ranges from mild to severe. Over time it may develop into long-lasting (chronic) kidney disease. Early detection and treatment may prevent acute kidney injury from developing into a chronic condition. What are the causes? Common causes of this condition include:  A problem with blood flow to the kidneys. This may be caused by: ? Low blood pressure (hypotension) or shock. ? Blood loss. ? Heart and blood vessel (cardiovascular) disease. ? Severe burns. ? Liver disease.  Direct damage to the kidneys. This may be  caused by: ? Certain medicines. ? A kidney infection. ? Poisoning. ? Being around or in contact with toxic substances. ? A surgical wound. ? A hard, direct hit to the kidney area.  A sudden blockage of urine flow. This may be caused by: ? Cancer. ? Kidney stones. ? An enlarged prostate in males.  What are the signs or symptoms? Symptoms of this condition may not  be obvious until the condition becomes severe. Symptoms of this condition can include:  Tiredness (lethargy), or difficulty staying awake.  Nausea or vomiting.  Swelling (edema) of the face, legs, ankles, or feet.  Problems with urination, such as: ? Abdominal pain, or pain along the side of your stomach (flank). ? Decreased urine production. ? Decrease in the force of urine flow.  Muscle twitches and cramps, especially in the legs.  Confusion or trouble concentrating.  Loss of appetite.  Fever.  How is this diagnosed? This condition may be diagnosed with tests, including:  Blood tests.  Urine tests.  Imaging tests.  A test in which a sample of tissue is removed from the kidneys to be examined under a microscope (kidney biopsy).  How is this treated? Treatment for this condition depends on the cause and how severe the condition is. In mild cases, treatment may not be needed. The kidneys may heal on their own. In more severe cases, treatment will involve:  Treating the cause of the kidney injury. This may involve changing any medicines you are taking or adjusting your dosage.  Fluids. You may need specialized IV fluids to balance your body's needs.  Having a catheter placed to drain urine and prevent blockages.  Preventing problems from occurring. This may mean avoiding certain medicines or procedures that can cause further injury to the kidneys.  In some cases treatment may also require:  A procedure to remove toxic wastes from the body (dialysis or continuous renal replacement therapy - CRRT).  Surgery. This may be done to repair a torn kidney, or to remove the blockage from the urinary system.  Follow these instructions at home: Medicines  Take over-the-counter and prescription medicines only as told by your health care provider.  Do not take any new medicines without your health care provider's approval. Many medicines can worsen your kidney damage.  Do not  take any vitamin and mineral supplements without your health care provider's approval. Many nutritional supplements can worsen your kidney damage. Lifestyle  If your health care provider prescribed changes to your diet, follow them. You may need to decrease the amount of protein you eat.  Achieve and maintain a healthy weight. If you need help with this, ask your health care provider.  Start or continue an exercise plan. Try to exercise at least 30 minutes a day, 5 days a week.  Do not use any tobacco products, such as cigarettes, chewing tobacco, and e-cigarettes. If you need help quitting, ask your health care provider. General instructions  Keep track of your blood pressure. Report changes in your blood pressure as told by your health care provider.  Stay up to date with immunizations. Ask your health care provider which immunizations you need.  Keep all follow-up visits as told by your health care provider. This is important. Where to find more information:  American Association of Kidney Patients: BombTimer.gl  National Kidney Foundation: www.kidney.[place]: https://mathis.com/  Life Options Rehabilitation Program: ? www.lifeoptions.org ? www.kidneyschool.org Contact a health care provider if:  Your symptoms get worse.  You  develop new symptoms. Get help right away if:  You develop symptoms of worsening kidney disease, which include: ? Headaches. ? Abnormally dark or light skin. ? Easy bruising. ? Frequent hiccups. ? Chest pain. ? Shortness of breath. ? End of menstruation in women. ? Seizures. ? Confusion or altered mental status. ? Abdominal or back pain. ? Itchiness.  You have a fever.  Your body is producing less urine.  You have pain or bleeding when you urinate. Summary  Acute kidney injury is a sudden worsening of kidney function.  Acute kidney injury can be caused by problems with blood flow to the kidneys, direct damage to the  kidneys, and sudden blockage of urine flow.  Symptoms of this condition may not be obvious until it becomes severe. Symptoms may include edema, lethargy, confusion, nausea or vomiting, and problems passing urine.  This condition can usually be diagnosed with blood tests, urine tests, and imaging tests. Sometimes a kidney biopsy is done to diagnose this condition.  Treatment for this condition often involves treating the underlying cause. It is treated with fluids, medicines, dialysis, diet changes, or surgery. This information is not intended to replace advice given to you by your health care provider. Make sure you discuss any questions you have with your health care provider. Document Released: 04/28/2011 Document Revised: 10/03/2016 Document Reviewed: 10/03/2016 Elsevier Interactive Patient Education  2017 [employer].

## 2017-09-26 NOTE — NC FL2 (Addendum)
[place] LEVEL OF CARE SCREENING TOOL     IDENTIFICATION  Patient Name: Tanya Smith DOBIS Birthdate: 03-Jan-1930 Sex: female Admission Date (Current Location): 09/22/2017  [facility] and [place] Number:  Herbalist and Address:  The [place]. [facility], [place] [address], [place],  [address]      Provider Number: [id number]  Attending Physician Name and Address:  Kayleen Memos, DO  Relative Name and Phone Number:       Current Level of Care: Hospital Recommended Level of Care: [place] Prior Approval Number:    Date Approved/Denied:   PASRR Number: [id number]  Discharge Plan: ALF Memory Care with PT. Daughter prefers to return rather than SNF.   Current Diagnoses: Patient Active Problem List   Diagnosis Date Noted  . Seizures ([place]) 09/22/2017  . SDH (subdural hematoma) ([place]) 02/22/2017  . Moderate dementia 05/08/2014  . Chest pain 12/23/2012  . Hypokalemia 12/23/2012  . Hypertension 09/30/2012  . Diabetes mellitus ([place]) 09/30/2012  . Dehydration 09/30/2012  . Weakness generalized 09/30/2012  . Acute kidney injury ([place]) 09/30/2012  . UTI (urinary tract infection) 09/30/2012  . Candidiasis of vagina 09/30/2012  . Dementia 09/30/2012  . Chronic kidney disease 09/30/2012  . Diastolic dysfunction 36/62/9476    Orientation RESPIRATION BLADDER Height & Weight    Oriented to person    Normal Incontinent Weight:  170 lb Height:   5\' 6"   BEHAVIORAL SYMPTOMS/MOOD NEUROLOGICAL BOWEL NUTRITION STATUS   Uncooperative, flat affect  Seizures, Dementia Continent According to discharge summary, "resume previous diet."  AMBULATORY STATUS COMMUNICATION OF NEEDS Skin   Limited Assist Verbally Normal                       Personal Care Assistance Level of Assistance  Bathing, Dressing Bathing Assistance: Limited assistance   Dressing Assistance: Limited assistance     Functional Limitations Info              SPECIAL CARE FACTORS FREQUENCY  PT (By licensed PT)     PT Frequency: 3 x week            Contractures  None    Additional Factors Info  Code Status, Allergies Code Status Info: FULL Allergies Info: Ace Inhibitors, Furosemide, Sertraline, Latex, Other, Tape           Current Medications (09/26/2017):  This is the current hospital active medication list Current Facility-Administered Medications  Medication Dose Route Frequency Provider Last Rate Last Dose  . 0.9 %  sodium chloride infusion   Intravenous Continuous Kayleen Memos, DO 75 mL/hr at 09/26/17 0540    . acetaminophen (TYLENOL) tablet 650 mg  650 mg Oral Q6H PRN Rise Patience, MD       Or  . acetaminophen (TYLENOL) suppository 650 mg  650 mg Rectal Q6H PRN Rise Patience, MD      . bisacodyl (DULCOLAX) suppository 10 mg  10 mg Rectal Daily PRN Schorr, Rhetta Mura, NP   10 mg at 09/25/17 1224  . brimonidine (ALPHAGAN) 0.15 % ophthalmic solution 1 drop  1 drop Both Eyes TID Rise Patience, MD   1 drop at 09/26/17 1028  . ciprofloxacin (CIPRO) tablet 500 mg  500 mg Oral Q breakfast Irene Pap N, DO   500 mg at 09/26/17 0820  . escitalopram (LEXAPRO) tablet 10 mg  10 mg Oral Daily Rise Patience, MD   10 mg at 09/26/17  1025  . feeding supplement (ENSURE ENLIVE) (ENSURE ENLIVE) liquid 237 mL  237 mL Oral TID Rise Patience, MD   237 mL at 09/26/17 0840  . fluticasone (FLONASE) 50 MCG/ACT nasal spray 1 spray  1 spray Each Nare Daily Rise Patience, MD   1 spray at 09/26/17 1028  . gi cocktail (Maalox,Lidocaine,Donnatal)  30 mL Oral Once Irene Pap N, DO      . heparin injection 5,000 Units  5,000 Units Subcutaneous Q8H Irene Pap N, DO   5,000 Units at 09/26/17 5809  . hydrALAZINE (APRESOLINE) injection 10 mg  10 mg Intravenous Q4H PRN Rise Patience, MD      . lactulose (CHRONULAC) 10 GM/15ML solution 30 g  30 g Oral TID Rise Patience, MD   30 g at 09/26/17 1027  .  latanoprost (XALATAN) 0.005 % ophthalmic solution 1 drop  1 drop Both Eyes QHS Rise Patience, MD   1 drop at 09/25/17 2244  . magnesium oxide (MAG-OX) tablet 400 mg  400 mg Oral BID Rise Patience, MD   400 mg at 09/26/17 1026  . metoCLOPramide (REGLAN) tablet 5 mg  5 mg Oral TID AC & HS Rise Patience, MD   5 mg at 09/26/17 0820  . mirtazapine (REMERON) tablet 30 mg  30 mg Oral QHS Rise Patience, MD   30 mg at 09/25/17 2234  . ondansetron (ZOFRAN) tablet 4 mg  4 mg Oral Q6H PRN Rise Patience, MD       Or  . ondansetron [facility]) injection 4 mg  4 mg Intravenous Q6H PRN Rise Patience, MD      . pantoprazole sodium (PROTONIX) 40 mg/20 mL oral suspension 40 mg  40 mg Oral BID Rise Patience, MD      . potassium chloride (K-DUR,KLOR-CON) CR tablet 20 mEq  20 mEq Oral Daily Rise Patience, MD   20 mEq at 09/26/17 1026  . senna-docusate (Senokot-S) tablet 1 tablet  1 tablet Oral BID Irene Pap N, DO   1 tablet at 09/26/17 1026  . Valproate Sodium (DEPAKENE) solution 250 mg  250 mg Oral Q6H Kayleen Memos, DO         Discharge Medications: STOP taking these medications   allopurinol 300 MG tablet Commonly known as:  ZYLOPRIM   ibuprofen 400 MG tablet Commonly known as:  ADVIL,MOTRIN   PROTONIX PO     TAKE these medications   acetaminophen 500 MG tablet Commonly known as:  TYLENOL Take 500 mg by mouth every 4 (four) hours as needed for pain or fever.   bisacodyl 5 MG EC tablet Generic drug:  bisacodyl Take 5 mg by mouth at bedtime.   brimonidine 0.1 % Soln Commonly known as:  ALPHAGAN P Place 1 drop into both eyes daily.   cetirizine 10 MG tablet Commonly known as:  ZYRTEC Take 10 mg by mouth daily.   ciprofloxacin 500 MG tablet Commonly known as:  CIPRO Take 1 tablet (500 mg total) by mouth daily with breakfast. Start taking on:  09/27/2017   escitalopram 5 MG tablet Commonly known as:  LEXAPRO Take 10 mg by mouth  daily.   fluconazole 150 MG tablet Commonly known as:  DIFLUCAN Take 1 tablet by mouth on first day of antibiotic and then take 1 tablet by mouth on last day of antibiotic   fluticasone 50 MCG/ACT nasal spray Commonly known as:  FLONASE Place 1 spray into both nostrils  daily.   furosemide 20 MG tablet Commonly known as:  LASIX Take 1 tablet (20 mg total) by mouth daily. x3 days starting 08/19/17   hydrochlorothiazide 12.5 MG tablet Commonly known as:  HYDRODIURIL Take 1 tablet (12.5 mg total) by mouth daily. What changed:  how much to take   lactulose 10 GM/15ML solution Commonly known as:  CHRONULAC Take 30 g by mouth 3 (three) times daily.   magnesium hydroxide 400 MG/5ML suspension Commonly known as:  MILK OF MAGNESIA Take 30 mLs by mouth at bedtime as needed for constipation.   magnesium oxide 400 MG tablet Commonly known as:  MAG-OX Take 400 mg by mouth 2 (two) times daily.   metoCLOPramide 5 MG/5ML solution Commonly known as:  REGLAN Take 5 mg by mouth 4 (four) times daily.   mirtazapine 30 MG tablet Commonly known as:  REMERON Take 30 mg by mouth at bedtime.   NUTRITIONAL DRINK Liqd Take 1 Container by mouth 3 (three) times daily. 0800, 1400, 2000   omeprazole 20 MG tablet Commonly known as:  PRILOSEC OTC Take 1 tablet (20 mg total) by mouth daily.   potassium chloride 20 MEQ packet Commonly known as:  KLOR-CON Take 20 mEq by mouth daily.   senna-docusate 8.6-50 MG tablet Commonly known as:  Senokot-S Take 1 tablet by mouth daily.   Travoprost (BAK Free) 0.004 % Soln ophthalmic solution Commonly known as:  TRAVATAN Place 1 drop into both eyes daily.                                                               Relevant Imaging Results:  Relevant Lab Results:   Additional Information SS#: [id number]  Candie Chroman, LCSW

## 2017-09-27 DIAGNOSIS — F0281 Dementia in other diseases classified elsewhere with behavioral disturbance: Secondary | ICD-10-CM | POA: Diagnosis not present

## 2017-09-27 DIAGNOSIS — S065X0A Traumatic subdural hemorrhage without loss of consciousness, initial encounter: Secondary | ICD-10-CM | POA: Diagnosis not present

## 2017-09-27 DIAGNOSIS — F0280 Dementia in other diseases classified elsewhere without behavioral disturbance: Secondary | ICD-10-CM | POA: Diagnosis not present

## 2017-09-27 DIAGNOSIS — I5032 Chronic diastolic (congestive) heart failure: Secondary | ICD-10-CM | POA: Diagnosis not present

## 2017-09-27 DIAGNOSIS — M6281 Muscle weakness (generalized): Secondary | ICD-10-CM | POA: Diagnosis not present

## 2017-09-27 DIAGNOSIS — S065X9A Traumatic subdural hemorrhage with loss of consciousness of unspecified duration, initial encounter: Secondary | ICD-10-CM | POA: Diagnosis not present

## 2017-09-27 DIAGNOSIS — I10 Essential (primary) hypertension: Secondary | ICD-10-CM | POA: Diagnosis not present

## 2017-09-27 DIAGNOSIS — K5901 Slow transit constipation: Secondary | ICD-10-CM | POA: Diagnosis not present

## 2017-09-27 DIAGNOSIS — S065X0S Traumatic subdural hemorrhage without loss of consciousness, sequela: Secondary | ICD-10-CM | POA: Diagnosis not present

## 2017-09-27 DIAGNOSIS — I129 Hypertensive chronic kidney disease with stage 1 through stage 4 chronic kidney disease, or unspecified chronic kidney disease: Secondary | ICD-10-CM | POA: Diagnosis not present

## 2017-09-27 DIAGNOSIS — E119 Type 2 diabetes mellitus without complications: Secondary | ICD-10-CM | POA: Diagnosis not present

## 2017-09-27 DIAGNOSIS — N183 Chronic kidney disease, stage 3 (moderate): Secondary | ICD-10-CM | POA: Diagnosis not present

## 2017-09-27 DIAGNOSIS — F0390 Unspecified dementia without behavioral disturbance: Secondary | ICD-10-CM | POA: Diagnosis not present

## 2017-09-27 DIAGNOSIS — N179 Acute kidney failure, unspecified: Secondary | ICD-10-CM | POA: Diagnosis not present

## 2017-09-27 DIAGNOSIS — N184 Chronic kidney disease, stage 4 (severe): Secondary | ICD-10-CM | POA: Diagnosis not present

## 2017-09-27 DIAGNOSIS — G308 Other Alzheimer's disease: Secondary | ICD-10-CM | POA: Diagnosis not present

## 2017-09-27 DIAGNOSIS — I519 Heart disease, unspecified: Secondary | ICD-10-CM | POA: Diagnosis not present

## 2017-09-27 DIAGNOSIS — R41841 Cognitive communication deficit: Secondary | ICD-10-CM | POA: Diagnosis not present

## 2017-09-27 DIAGNOSIS — G40909 Epilepsy, unspecified, not intractable, without status epilepticus: Secondary | ICD-10-CM | POA: Diagnosis not present

## 2017-09-27 DIAGNOSIS — I5030 Unspecified diastolic (congestive) heart failure: Secondary | ICD-10-CM | POA: Diagnosis not present

## 2017-09-27 NOTE — Progress Notes (Addendum)
Patient/family educated and discharge information given. Patient stable. No new concerns  Family verbalized understanding of discharge instructions

## 2017-09-27 NOTE — Progress Notes (Signed)
Report given to Maudie Mercury at [facility]. No new concerns

## 2017-09-27 NOTE — Discharge Summary (Signed)
Discharge Summary  Tanya Smith WGY:[id number] DOB: 1930/04/20  PCP: Lavone Orn, MD  Admit date: 09/22/2017 Discharge date: 09/27/2017  Time spent: 25 minutes  Update: Patient stayed overnight as the patient's daughter made the decision to have the patient transferred to SNF instead of assisted living memory center. No acute events overnight. Plan as stated below.  Recommendations for Outpatient Follow-up:  1. Follow up with PCP within a week 2. Follow up with GI within a week 3. Take your medications as prescribed 4. Continue PT as tolerated 5. Fall precaution  6. PT and provider recommend SNF however, patient daughter declines SNF.  Discharge Diagnoses:  Active Hospital Problems   Diagnosis Date Noted  . Seizures ([place]) 09/22/2017  . SDH (subdural hematoma) ([place]) 02/22/2017  . Hypertension 09/30/2012  . Diastolic dysfunction 77/93/9030  . Dementia 09/30/2012  . Diabetes mellitus ([place]) 09/30/2012  . Chronic kidney disease 09/30/2012    Resolved Hospital Problems  No resolved problems to display.    Discharge Condition: Stable  Diet recommendation: Resume previous diet  Vitals:   09/26/17 2100 09/27/17 0408  BP: (!) 153/80 122/62  Pulse: 71 61  Resp: 18 18  Temp: 98.4 F (36.9 C) 98.8 F (37.1 C)  SpO2: 97% 100%    History of present illness:  Ms. Lecker is a 83 yo F from memory facility, assisted living with PMH significant for severe dementia, subdural hemorrhage, chronic diastolic CHF, CKD, gout, was brought in due to new onset seizures witnessed at memory facility by daughter. Generalized tonic clonic x 2. Recent admission at [facility] after hospitalization 2-3 weeks for constipation self reported by daughter and discharged 1 day prior to presentation.  Neurology following. Depakote 500 mg BID liquid form due to the patient holding food in her mouth/spitting out her pills.  CT abdomen/pelvis revealed retained stools. Fleet enema given yesterday with fecal  matter expelled.  Positive U/A 09/23/17 appears asymptomatic. No leukocytosis or sign of active infective process. Will give po cipro 500 mg daily x 3 days.  AKI on CKD 3 resolved. Cr 2.49 on presentation, 1.82 (09/26/17) at baseline.  On the day of discharge the patient was hemodynamically stable. She has a baseline severe dementia. Did not appear in any distress.   Hospital Course:  Principal Problem:   Seizures ([place]) Active Problems:   Hypertension   Diabetes mellitus ([place])   Dementia   Chronic kidney disease   Diastolic dysfunction   SDH (subdural hematoma) ([facility])   New onset seizures -unclear source -no recurrence reported during this admission -Neurology following -depakote po 500 mg BID liquid form as the patient holds pills or spit them out. -depakote level on Monday 09/28/17  AKI on ckd 3, resolved -Most likely multifactorial 2/2 to dehydration vs others -avoid hypotension/nephrotoxic meds -cr. 1.8 from cr 2.12 from 2.49 from 2.26 from 2.08  -baseline cr 1.8 -stay hydrated  Chronic constipation -xray showing retained stools 09/22/17 -enema, lactulose, sennokot bid, reglan -follow up with GI outpatient  Chronic diastolic CHF -appears stable -2D echo 12/24/12 LVEF 60-65% -allergic Ace I  HTN -Hypotension has resolved -Hold hctz due to AKI and recent hypotension -Allergic to Ace I  Severe dementia -Reorient as needed -Fall precaution  Gout -No acute issues -hold allopurinol due to AKI and recent seizures  Anxiety/depression -escitalopram, mirtazapine -withdrawal sx if abrupt d/c of mirtazapine  Ambulatory dysfunction -PT to evaluate and treat -PT recommends SNF   Procedures:  None  Consultations:  None  Discharge Exam: BP 122/62 (  BP Location: Left Arm)   Pulse 61   Temp 98.8 F (37.1 C) (Oral)   Resp 18   Ht 5' (1.524 m)   Wt 74.8 kg (165 lb)   SpO2 100%   BMI 32.22 kg/m    General:  83 yo AAFD WD WN NAD. Somnolent.  baseline severe dementia.   Cardiovascular: RRR with no rubs, gallops   Respiratory: CTA with no wheezes or rhonchi   Abdomen: Soft NT ND NBS x4  quadrants  Musculoskeletal: Moves all limbs   Skin: No noted open lesions  Psychiatry: Baseline severe dementia. Mood is appropriate for condition and setting.  Discharge Instructions You were cared for by a hospitalist during your hospital stay. If you have any questions about your discharge medications or the care you received while you were in the hospital after you are discharged, you can call the unit and asked to speak with the hospitalist on call if the hospitalist that took care of you is not available. Once you are discharged, your primary care physician will handle any further medical issues. Please note that NO REFILLS for any discharge medications will be authorized once you are discharged, as it is imperative that you return to your primary care physician (or establish a relationship with a primary care physician if you do not have one) for your aftercare needs so that they can reassess your need for medications and monitor your lab values.   Allergies as of 09/27/2017      Reactions   Ace Inhibitors Other (See Comments)   Furosemide Other (See Comments)   Unknown. Per MAR. Can take brand name Lasix.    Sertraline Other (See Comments)   Unknown; per physician's orders    Latex Rash   Other Rash   Soap & deodorant.   Tape Rash      Medication List    STOP taking these medications   allopurinol 300 MG tablet Commonly known as:  ZYLOPRIM   ibuprofen 400 MG tablet Commonly known as:  ADVIL,MOTRIN   PROTONIX PO     TAKE these medications   acetaminophen 500 MG tablet Commonly known as:  TYLENOL Take 500 mg by mouth every 4 (four) hours as needed for pain or fever.   bisacodyl 5 MG EC tablet Generic drug:  bisacodyl Take 5 mg by mouth at bedtime.   brimonidine 0.1 % Soln Commonly known as:  ALPHAGAN P Place 1 drop  into both eyes daily.   cetirizine 10 MG tablet Commonly known as:  ZYRTEC Take 10 mg by mouth daily.   ciprofloxacin 500 MG tablet Commonly known as:  CIPRO Take 1 tablet (500 mg total) by mouth daily with breakfast.   escitalopram 5 MG tablet Commonly known as:  LEXAPRO Take 10 mg by mouth daily.   fluconazole 150 MG tablet Commonly known as:  DIFLUCAN Take 1 tablet by mouth on first day of antibiotic and then take 1 tablet by mouth on last day of antibiotic   fluticasone 50 MCG/ACT nasal spray Commonly known as:  FLONASE Place 1 spray into both nostrils daily.   furosemide 20 MG tablet Commonly known as:  LASIX Take 1 tablet (20 mg total) by mouth daily. x3 days starting 08/19/17   hydrochlorothiazide 12.5 MG tablet Commonly known as:  HYDRODIURIL Take 1 tablet (12.5 mg total) by mouth daily. What changed:  how much to take   lactulose 10 GM/15ML solution Commonly known as:  CHRONULAC Take 30 g by mouth 3 (  three) times daily.   magnesium hydroxide 400 MG/5ML suspension Commonly known as:  MILK OF MAGNESIA Take 30 mLs by mouth at bedtime as needed for constipation.   magnesium oxide 400 MG tablet Commonly known as:  MAG-OX Take 400 mg by mouth 2 (two) times daily.   metoCLOPramide 5 MG/5ML solution Commonly known as:  REGLAN Take 5 mg by mouth 4 (four) times daily.   mirtazapine 30 MG tablet Commonly known as:  REMERON Take 30 mg by mouth at bedtime.   NUTRITIONAL DRINK Liqd Take 1 Container by mouth 3 (three) times daily. 0800, 1400, 2000   omeprazole 20 MG tablet Commonly known as:  PRILOSEC OTC Take 1 tablet (20 mg total) by mouth daily.   potassium chloride 20 MEQ packet Commonly known as:  KLOR-CON Take 20 mEq by mouth daily.   senna-docusate 8.6-50 MG tablet Commonly known as:  Senokot-S Take 1 tablet by mouth daily.   Travoprost (BAK Free) 0.004 % Soln ophthalmic solution Commonly known as:  TRAVATAN Place 1 drop into both eyes daily.        Allergies  Allergen Reactions  . Ace Inhibitors Other (See Comments)  . Furosemide Other (See Comments)    Unknown. Per MAR. Can take brand name Lasix.   . Sertraline Other (See Comments)    Unknown; per physician's orders   . Latex Rash  . Other Rash    Soap & deodorant.  . Tape Rash    Contact information for follow-up providers    Lavone Orn, MD Follow up.   Specialty:  Internal Medicine Contact information: [address], [address] [place] [address] [phone]            Contact information for after-discharge care    Destination    [facility] .   Service:  Skilled Nursing Contact information: [address] [place] [phone]                   The results of significant diagnostics from this hospitalization (including imaging, microbiology, ancillary and laboratory) are listed below for reference.    Significant Diagnostic Studies: Ct Head Wo Contrast  Result Date: 09/22/2017 CLINICAL DATA:  Altered level of consciousness EXAM: CT HEAD WITHOUT CONTRAST TECHNIQUE: Contiguous axial images were obtained from the base of the skull through the vertex without intravenous contrast. COMPARISON:  02/25/2017, 02/22/2017, 06/14/2013 FINDINGS: Brain: Motion degradation. No definite acute territorial infarction, hemorrhage or mass. Old left cerebellar infarct. Atrophy and moderate small vessel ischemic changes of the white matter. Probable old lacunar infarcts in the thalamus. Stable ventricle size. Old right temporal lobe infarct. Vascular: No hyperdense vessels.  Carotid vessel calcification Skull: No fracture Sinuses/Orbits: No acute finding. Other: None IMPRESSION: 1. Motion degraded study 2. No definite CT evidence for acute intracranial abnormality 3. Atrophy with small vessel ischemic changes of the white matter. Old right temporal and left cerebellar infarcts. Electronically Signed   By: Donavan Foil M.D.    On: 09/22/2017 21:23   Dg Chest Port 1 View  Result Date: 08/28/2017 CLINICAL DATA:  83 year old female with chest pain. EXAM: PORTABLE CHEST 1 VIEW COMPARISON:  Chest radiograph dated 08/18/2017 FINDINGS: There is stable cardiomegaly. Mild central vascular prominence. There is no focal consolidation, pleural effusion, or pneumothorax. There is atherosclerotic calcification of the aortic arch. A hiatal hernia may be present. There is osteopenia with degenerative changes of the spine and right shoulder. No acute osseous pathology. IMPRESSION: Cardiomegaly with possible  mild vascular congestion. No focal consolidation. Electronically Signed   By: Anner Crete M.D.   On: 08/28/2017 23:49   Dg Abdomen Acute W/chest  Result Date: 09/22/2017 CLINICAL DATA:  83 y/o  F; intermittent chest pain. EXAM: DG ABDOMEN ACUTE W/ 1V CHEST COMPARISON:  08/28/2017 chest radiograph. FINDINGS: Stable cardiomegaly. Aortic atherosclerosis with calcification. Hazy opacities at lung bases probably represent atelectasis. Rounded opacity arising from left hemidiaphragm. Bowel super position over the liver. The colon is mildly dilated with large volume of stool. Extensive degenerative changes of the spine. No acute osseous abnormality is evident. IMPRESSION: 1. Hazy opacification of lung bases, probably atelectasis and small effusions. 2. Mild dilatation of the colon and large volume of stool, probably constipation/ fecal impaction. 3. Rounded opacity arising from left hemidiaphragm, question hiatal or diaphragmatic hernia. 4. Cardiomegaly and aortic atherosclerosis. Electronically Signed   By: Kristine Garbe M.D.   On: 09/22/2017 21:01    Microbiology: No results found for this or any previous visit (from the past 240 hour(s)).   Labs: Basic Metabolic Panel: Recent Labs  Lab 09/22/17 2017 09/23/17 0354 09/24/17 0402 09/25/17 1349 09/26/17 0617  NA 136 141 139 138 140  K 4.0 4.3 3.3* 3.7 4.1  CL 101 105  106 107 108  CO2 24 27 25 23 23   GLUCOSE 105* 71 84 83 67  BUN 22* 24* 26* 23* 21*  CREATININE 2.08* 2.26* 2.49* 2.12* 1.82*  CALCIUM 9.0 9.0 8.4* 8.6* 8.5*  MG 2.2  --   --   --   --    Liver Function Tests: Recent Labs  Lab 09/22/17 2017  AST 30  ALT 14  ALKPHOS 92  BILITOT 0.3  PROT 7.1  ALBUMIN 3.1*   No results for input(s): LIPASE, AMYLASE in the last 168 hours. No results for input(s): AMMONIA in the last 168 hours. CBC: Recent Labs  Lab 09/22/17 2017 09/23/17 0354 09/24/17 0402  WBC 5.0 5.9 5.9  NEUTROABS 3.0  --   --   HGB 12.1 10.6* 10.0*  HCT 37.6 33.5* 31.3*  MCV 89.3 90.1 88.4  PLT 278 285 249   Cardiac Enzymes: No results for input(s): CKTOTAL, CKMB, CKMBINDEX, TROPONINI in the last 168 hours. BNP: BNP (last 3 results) Recent Labs    08/18/17 1302  BNP 314.2*    ProBNP (last 3 results) No results for input(s): PROBNP in the last 8760 hours.  CBG: Recent Labs  Lab 09/23/17 0752  GLUCAP 87       Signed:  Kayleen Memos, MD [facility] 09/27/2017, 9:12 AM

## 2017-09-27 NOTE — Clinical Social Work Placement (Signed)
   CLINICAL SOCIAL WORK PLACEMENT  NOTE  Date:  09/27/2017  Patient Details  Name: Tanya Smith MRN: [id number] Date of Birth: 07/06/1930  Clinical Social Work is seeking post-discharge placement for this patient at the [place] level of care (*CSW will initial, date and re-position this form in  chart as items are completed):  Yes   Patient/family provided with [facility] Work Department's list of facilities offering this level of care within the geographic area requested by the patient (or if unable, by the patient's family).  Yes   Patient/family informed of their freedom to choose among providers that offer the needed level of care, that participate in Medicare, Medicaid or managed care program needed by the patient, have an available bed and are willing to accept the patient.  Yes   Patient/family informed of [facility]'s ownership interest in [facility] and [facility], as well as of the fact that they are under no obligation to receive care at these facilities.  PASRR submitted to EDS on       PASRR number received on       Existing PASRR number confirmed on (SNF PASARR was on unfinished FL2. Unsure of date confirmed.)     FL2 transmitted to all facilities in geographic area requested by pt/family on       FL2 transmitted to all facilities within larger geographic area on       Patient informed that his/her managed care company has contracts with or will negotiate with certain facilities, including the following:        Yes   Patient/family informed of bed offers received.  Patient chooses bed at [facility]     Physician recommends and patient chooses bed at      Patient to be transferred to [facility] on 09/27/17.  Patient to be transferred to facility by Daughter will transport by car     Patient family notified on 09/27/17 of transfer.  Name of family member notified:        PHYSICIAN Please prepare prescriptions      Additional Comment:    _______________________________________________ Candie Chroman, LCSW 09/27/2017, 10:28 AM

## 2017-09-27 NOTE — Clinical Social Work Note (Addendum)
CSW facilitated patient discharge including contacting patient family and facility to confirm patient discharge plans. Clinical information faxed to facility and family agreeable with plan. Patient's daughter and niece will transport patient by car to [place]. She can leave at 2:12 pm (24 hours since sitter order discontinued). RN to call report prior to discharge [phone]).  CSW will sign off for now as social work intervention is no longer needed. Please consult [place] again if new needs arise.  Dayton Scrape, [place]

## 2017-09-30 DIAGNOSIS — I10 Essential (primary) hypertension: Secondary | ICD-10-CM | POA: Diagnosis not present

## 2017-09-30 DIAGNOSIS — S065X0S Traumatic subdural hemorrhage without loss of consciousness, sequela: Secondary | ICD-10-CM | POA: Diagnosis not present

## 2017-09-30 DIAGNOSIS — F0281 Dementia in other diseases classified elsewhere with behavioral disturbance: Secondary | ICD-10-CM | POA: Diagnosis not present

## 2017-09-30 DIAGNOSIS — I5032 Chronic diastolic (congestive) heart failure: Secondary | ICD-10-CM | POA: Diagnosis not present

## 2017-10-02 DIAGNOSIS — R41841 Cognitive communication deficit: Secondary | ICD-10-CM | POA: Diagnosis not present

## 2017-10-02 DIAGNOSIS — R1319 Other dysphagia: Secondary | ICD-10-CM | POA: Diagnosis not present

## 2017-10-02 DIAGNOSIS — R262 Difficulty in walking, not elsewhere classified: Secondary | ICD-10-CM | POA: Diagnosis not present

## 2017-10-02 DIAGNOSIS — M6281 Muscle weakness (generalized): Secondary | ICD-10-CM | POA: Diagnosis not present

## 2017-10-02 DIAGNOSIS — F0280 Dementia in other diseases classified elsewhere without behavioral disturbance: Secondary | ICD-10-CM | POA: Diagnosis not present

## 2017-10-07 DIAGNOSIS — R41841 Cognitive communication deficit: Secondary | ICD-10-CM | POA: Diagnosis not present

## 2017-10-07 DIAGNOSIS — R262 Difficulty in walking, not elsewhere classified: Secondary | ICD-10-CM | POA: Diagnosis not present

## 2017-10-07 DIAGNOSIS — F0280 Dementia in other diseases classified elsewhere without behavioral disturbance: Secondary | ICD-10-CM | POA: Diagnosis not present

## 2017-10-07 DIAGNOSIS — R1319 Other dysphagia: Secondary | ICD-10-CM | POA: Diagnosis not present

## 2017-10-07 DIAGNOSIS — M6281 Muscle weakness (generalized): Secondary | ICD-10-CM | POA: Diagnosis not present

## 2017-10-08 DIAGNOSIS — I509 Heart failure, unspecified: Secondary | ICD-10-CM | POA: Diagnosis not present

## 2017-10-08 DIAGNOSIS — R41841 Cognitive communication deficit: Secondary | ICD-10-CM | POA: Diagnosis not present

## 2017-10-08 DIAGNOSIS — R1319 Other dysphagia: Secondary | ICD-10-CM | POA: Diagnosis not present

## 2017-10-08 DIAGNOSIS — I6200 Nontraumatic subdural hemorrhage, unspecified: Secondary | ICD-10-CM | POA: Diagnosis not present

## 2017-10-08 DIAGNOSIS — F0391 Unspecified dementia with behavioral disturbance: Secondary | ICD-10-CM | POA: Diagnosis not present

## 2017-10-08 DIAGNOSIS — E46 Unspecified protein-calorie malnutrition: Secondary | ICD-10-CM | POA: Diagnosis not present

## 2017-10-08 DIAGNOSIS — R262 Difficulty in walking, not elsewhere classified: Secondary | ICD-10-CM | POA: Diagnosis not present

## 2017-10-08 DIAGNOSIS — F0280 Dementia in other diseases classified elsewhere without behavioral disturbance: Secondary | ICD-10-CM | POA: Diagnosis not present

## 2017-10-08 DIAGNOSIS — N182 Chronic kidney disease, stage 2 (mild): Secondary | ICD-10-CM | POA: Diagnosis not present

## 2017-10-08 DIAGNOSIS — R569 Unspecified convulsions: Secondary | ICD-10-CM | POA: Diagnosis not present

## 2017-10-08 DIAGNOSIS — E1122 Type 2 diabetes mellitus with diabetic chronic kidney disease: Secondary | ICD-10-CM | POA: Diagnosis not present

## 2017-10-08 DIAGNOSIS — M6281 Muscle weakness (generalized): Secondary | ICD-10-CM | POA: Diagnosis not present

## 2017-10-08 DIAGNOSIS — I10 Essential (primary) hypertension: Secondary | ICD-10-CM | POA: Diagnosis not present

## 2017-10-09 DIAGNOSIS — R41841 Cognitive communication deficit: Secondary | ICD-10-CM | POA: Diagnosis not present

## 2017-10-09 DIAGNOSIS — M6281 Muscle weakness (generalized): Secondary | ICD-10-CM | POA: Diagnosis not present

## 2017-10-09 DIAGNOSIS — R1319 Other dysphagia: Secondary | ICD-10-CM | POA: Diagnosis not present

## 2017-10-09 DIAGNOSIS — R262 Difficulty in walking, not elsewhere classified: Secondary | ICD-10-CM | POA: Diagnosis not present

## 2017-10-09 DIAGNOSIS — F0280 Dementia in other diseases classified elsewhere without behavioral disturbance: Secondary | ICD-10-CM | POA: Diagnosis not present

## 2017-10-10 DIAGNOSIS — R41841 Cognitive communication deficit: Secondary | ICD-10-CM | POA: Diagnosis not present

## 2017-10-10 DIAGNOSIS — M6281 Muscle weakness (generalized): Secondary | ICD-10-CM | POA: Diagnosis not present

## 2017-10-10 DIAGNOSIS — R1319 Other dysphagia: Secondary | ICD-10-CM | POA: Diagnosis not present

## 2017-10-10 DIAGNOSIS — F0280 Dementia in other diseases classified elsewhere without behavioral disturbance: Secondary | ICD-10-CM | POA: Diagnosis not present

## 2017-10-10 DIAGNOSIS — R262 Difficulty in walking, not elsewhere classified: Secondary | ICD-10-CM | POA: Diagnosis not present

## 2017-10-12 DIAGNOSIS — F0280 Dementia in other diseases classified elsewhere without behavioral disturbance: Secondary | ICD-10-CM | POA: Diagnosis not present

## 2017-10-12 DIAGNOSIS — R262 Difficulty in walking, not elsewhere classified: Secondary | ICD-10-CM | POA: Diagnosis not present

## 2017-10-12 DIAGNOSIS — R41841 Cognitive communication deficit: Secondary | ICD-10-CM | POA: Diagnosis not present

## 2017-10-12 DIAGNOSIS — M6281 Muscle weakness (generalized): Secondary | ICD-10-CM | POA: Diagnosis not present

## 2017-10-12 DIAGNOSIS — R1319 Other dysphagia: Secondary | ICD-10-CM | POA: Diagnosis not present

## 2017-10-13 DIAGNOSIS — F0280 Dementia in other diseases classified elsewhere without behavioral disturbance: Secondary | ICD-10-CM | POA: Diagnosis not present

## 2017-10-13 DIAGNOSIS — R262 Difficulty in walking, not elsewhere classified: Secondary | ICD-10-CM | POA: Diagnosis not present

## 2017-10-13 DIAGNOSIS — M6281 Muscle weakness (generalized): Secondary | ICD-10-CM | POA: Diagnosis not present

## 2017-10-13 DIAGNOSIS — R1319 Other dysphagia: Secondary | ICD-10-CM | POA: Diagnosis not present

## 2017-10-13 DIAGNOSIS — R41841 Cognitive communication deficit: Secondary | ICD-10-CM | POA: Diagnosis not present

## 2017-10-14 DIAGNOSIS — R41841 Cognitive communication deficit: Secondary | ICD-10-CM | POA: Diagnosis not present

## 2017-10-14 DIAGNOSIS — F0280 Dementia in other diseases classified elsewhere without behavioral disturbance: Secondary | ICD-10-CM | POA: Diagnosis not present

## 2017-10-14 DIAGNOSIS — R1319 Other dysphagia: Secondary | ICD-10-CM | POA: Diagnosis not present

## 2017-10-14 DIAGNOSIS — R262 Difficulty in walking, not elsewhere classified: Secondary | ICD-10-CM | POA: Diagnosis not present

## 2017-10-14 DIAGNOSIS — M6281 Muscle weakness (generalized): Secondary | ICD-10-CM | POA: Diagnosis not present

## 2017-10-15 DIAGNOSIS — R1319 Other dysphagia: Secondary | ICD-10-CM | POA: Diagnosis not present

## 2017-10-15 DIAGNOSIS — R41841 Cognitive communication deficit: Secondary | ICD-10-CM | POA: Diagnosis not present

## 2017-10-15 DIAGNOSIS — R262 Difficulty in walking, not elsewhere classified: Secondary | ICD-10-CM | POA: Diagnosis not present

## 2017-10-15 DIAGNOSIS — M6281 Muscle weakness (generalized): Secondary | ICD-10-CM | POA: Diagnosis not present

## 2017-10-15 DIAGNOSIS — F0390 Unspecified dementia without behavioral disturbance: Secondary | ICD-10-CM | POA: Diagnosis not present

## 2017-10-15 DIAGNOSIS — F0280 Dementia in other diseases classified elsewhere without behavioral disturbance: Secondary | ICD-10-CM | POA: Diagnosis not present

## 2017-10-15 DIAGNOSIS — G40909 Epilepsy, unspecified, not intractable, without status epilepticus: Secondary | ICD-10-CM | POA: Diagnosis not present

## 2017-10-16 DIAGNOSIS — R1319 Other dysphagia: Secondary | ICD-10-CM | POA: Diagnosis not present

## 2017-10-16 DIAGNOSIS — R262 Difficulty in walking, not elsewhere classified: Secondary | ICD-10-CM | POA: Diagnosis not present

## 2017-10-16 DIAGNOSIS — M6281 Muscle weakness (generalized): Secondary | ICD-10-CM | POA: Diagnosis not present

## 2017-10-16 DIAGNOSIS — F0280 Dementia in other diseases classified elsewhere without behavioral disturbance: Secondary | ICD-10-CM | POA: Diagnosis not present

## 2017-10-16 DIAGNOSIS — R41841 Cognitive communication deficit: Secondary | ICD-10-CM | POA: Diagnosis not present

## 2017-10-24 DIAGNOSIS — F329 Major depressive disorder, single episode, unspecified: Secondary | ICD-10-CM | POA: Diagnosis not present

## 2017-10-24 DIAGNOSIS — F419 Anxiety disorder, unspecified: Secondary | ICD-10-CM | POA: Diagnosis not present

## 2017-10-24 DIAGNOSIS — F0390 Unspecified dementia without behavioral disturbance: Secondary | ICD-10-CM | POA: Diagnosis not present

## 2017-10-24 DIAGNOSIS — I5032 Chronic diastolic (congestive) heart failure: Secondary | ICD-10-CM | POA: Diagnosis not present

## 2017-10-24 DIAGNOSIS — I130 Hypertensive heart and chronic kidney disease with heart failure and stage 1 through stage 4 chronic kidney disease, or unspecified chronic kidney disease: Secondary | ICD-10-CM | POA: Diagnosis not present

## 2017-10-24 DIAGNOSIS — E1122 Type 2 diabetes mellitus with diabetic chronic kidney disease: Secondary | ICD-10-CM | POA: Diagnosis not present

## 2017-10-24 DIAGNOSIS — M109 Gout, unspecified: Secondary | ICD-10-CM | POA: Diagnosis not present

## 2017-10-24 DIAGNOSIS — K5909 Other constipation: Secondary | ICD-10-CM | POA: Diagnosis not present

## 2017-10-24 DIAGNOSIS — Z7951 Long term (current) use of inhaled steroids: Secondary | ICD-10-CM | POA: Diagnosis not present

## 2017-10-24 DIAGNOSIS — R569 Unspecified convulsions: Secondary | ICD-10-CM | POA: Diagnosis not present

## 2017-10-24 DIAGNOSIS — Z9181 History of falling: Secondary | ICD-10-CM | POA: Diagnosis not present

## 2017-10-24 DIAGNOSIS — N183 Chronic kidney disease, stage 3 (moderate): Secondary | ICD-10-CM | POA: Diagnosis not present

## 2017-10-29 DIAGNOSIS — F0390 Unspecified dementia without behavioral disturbance: Secondary | ICD-10-CM | POA: Diagnosis not present

## 2017-10-29 DIAGNOSIS — N183 Chronic kidney disease, stage 3 (moderate): Secondary | ICD-10-CM | POA: Diagnosis not present

## 2017-10-29 DIAGNOSIS — R569 Unspecified convulsions: Secondary | ICD-10-CM | POA: Diagnosis not present

## 2017-10-29 DIAGNOSIS — I5032 Chronic diastolic (congestive) heart failure: Secondary | ICD-10-CM | POA: Diagnosis not present

## 2017-10-29 DIAGNOSIS — E1122 Type 2 diabetes mellitus with diabetic chronic kidney disease: Secondary | ICD-10-CM | POA: Diagnosis not present

## 2017-10-29 DIAGNOSIS — I130 Hypertensive heart and chronic kidney disease with heart failure and stage 1 through stage 4 chronic kidney disease, or unspecified chronic kidney disease: Secondary | ICD-10-CM | POA: Diagnosis not present

## 2017-10-30 DIAGNOSIS — F0390 Unspecified dementia without behavioral disturbance: Secondary | ICD-10-CM | POA: Diagnosis not present

## 2017-10-30 DIAGNOSIS — I130 Hypertensive heart and chronic kidney disease with heart failure and stage 1 through stage 4 chronic kidney disease, or unspecified chronic kidney disease: Secondary | ICD-10-CM | POA: Diagnosis not present

## 2017-10-30 DIAGNOSIS — R569 Unspecified convulsions: Secondary | ICD-10-CM | POA: Diagnosis not present

## 2017-10-30 DIAGNOSIS — I5032 Chronic diastolic (congestive) heart failure: Secondary | ICD-10-CM | POA: Diagnosis not present

## 2017-10-30 DIAGNOSIS — E1122 Type 2 diabetes mellitus with diabetic chronic kidney disease: Secondary | ICD-10-CM | POA: Diagnosis not present

## 2017-10-30 DIAGNOSIS — N183 Chronic kidney disease, stage 3 (moderate): Secondary | ICD-10-CM | POA: Diagnosis not present

## 2017-11-03 DIAGNOSIS — R569 Unspecified convulsions: Secondary | ICD-10-CM | POA: Diagnosis not present

## 2017-11-03 DIAGNOSIS — N183 Chronic kidney disease, stage 3 (moderate): Secondary | ICD-10-CM | POA: Diagnosis not present

## 2017-11-03 DIAGNOSIS — F0390 Unspecified dementia without behavioral disturbance: Secondary | ICD-10-CM | POA: Diagnosis not present

## 2017-11-03 DIAGNOSIS — I5032 Chronic diastolic (congestive) heart failure: Secondary | ICD-10-CM | POA: Diagnosis not present

## 2017-11-03 DIAGNOSIS — I130 Hypertensive heart and chronic kidney disease with heart failure and stage 1 through stage 4 chronic kidney disease, or unspecified chronic kidney disease: Secondary | ICD-10-CM | POA: Diagnosis not present

## 2017-11-03 DIAGNOSIS — E1122 Type 2 diabetes mellitus with diabetic chronic kidney disease: Secondary | ICD-10-CM | POA: Diagnosis not present

## 2017-11-11 DIAGNOSIS — R569 Unspecified convulsions: Secondary | ICD-10-CM | POA: Diagnosis not present

## 2017-11-11 DIAGNOSIS — F0390 Unspecified dementia without behavioral disturbance: Secondary | ICD-10-CM | POA: Diagnosis not present

## 2017-11-11 DIAGNOSIS — I130 Hypertensive heart and chronic kidney disease with heart failure and stage 1 through stage 4 chronic kidney disease, or unspecified chronic kidney disease: Secondary | ICD-10-CM | POA: Diagnosis not present

## 2017-11-11 DIAGNOSIS — I5032 Chronic diastolic (congestive) heart failure: Secondary | ICD-10-CM | POA: Diagnosis not present

## 2017-11-11 DIAGNOSIS — N183 Chronic kidney disease, stage 3 (moderate): Secondary | ICD-10-CM | POA: Diagnosis not present

## 2017-11-11 DIAGNOSIS — E1122 Type 2 diabetes mellitus with diabetic chronic kidney disease: Secondary | ICD-10-CM | POA: Diagnosis not present

## 2017-11-18 DIAGNOSIS — I130 Hypertensive heart and chronic kidney disease with heart failure and stage 1 through stage 4 chronic kidney disease, or unspecified chronic kidney disease: Secondary | ICD-10-CM | POA: Diagnosis not present

## 2017-11-18 DIAGNOSIS — E1122 Type 2 diabetes mellitus with diabetic chronic kidney disease: Secondary | ICD-10-CM | POA: Diagnosis not present

## 2017-11-18 DIAGNOSIS — F0390 Unspecified dementia without behavioral disturbance: Secondary | ICD-10-CM | POA: Diagnosis not present

## 2017-11-18 DIAGNOSIS — R569 Unspecified convulsions: Secondary | ICD-10-CM | POA: Diagnosis not present

## 2017-11-18 DIAGNOSIS — I5032 Chronic diastolic (congestive) heart failure: Secondary | ICD-10-CM | POA: Diagnosis not present

## 2017-11-18 DIAGNOSIS — N183 Chronic kidney disease, stage 3 (moderate): Secondary | ICD-10-CM | POA: Diagnosis not present

## 2017-11-20 DIAGNOSIS — F0390 Unspecified dementia without behavioral disturbance: Secondary | ICD-10-CM | POA: Diagnosis not present

## 2017-11-20 DIAGNOSIS — N183 Chronic kidney disease, stage 3 (moderate): Secondary | ICD-10-CM | POA: Diagnosis not present

## 2017-11-20 DIAGNOSIS — R569 Unspecified convulsions: Secondary | ICD-10-CM | POA: Diagnosis not present

## 2017-11-20 DIAGNOSIS — E1122 Type 2 diabetes mellitus with diabetic chronic kidney disease: Secondary | ICD-10-CM | POA: Diagnosis not present

## 2017-11-20 DIAGNOSIS — I5032 Chronic diastolic (congestive) heart failure: Secondary | ICD-10-CM | POA: Diagnosis not present

## 2017-11-20 DIAGNOSIS — I130 Hypertensive heart and chronic kidney disease with heart failure and stage 1 through stage 4 chronic kidney disease, or unspecified chronic kidney disease: Secondary | ICD-10-CM | POA: Diagnosis not present

## 2017-11-21 DIAGNOSIS — I5032 Chronic diastolic (congestive) heart failure: Secondary | ICD-10-CM | POA: Diagnosis not present

## 2017-11-21 DIAGNOSIS — R569 Unspecified convulsions: Secondary | ICD-10-CM | POA: Diagnosis not present

## 2017-11-21 DIAGNOSIS — I130 Hypertensive heart and chronic kidney disease with heart failure and stage 1 through stage 4 chronic kidney disease, or unspecified chronic kidney disease: Secondary | ICD-10-CM | POA: Diagnosis not present

## 2017-11-21 DIAGNOSIS — E1122 Type 2 diabetes mellitus with diabetic chronic kidney disease: Secondary | ICD-10-CM | POA: Diagnosis not present

## 2017-11-21 DIAGNOSIS — N183 Chronic kidney disease, stage 3 (moderate): Secondary | ICD-10-CM | POA: Diagnosis not present

## 2017-11-21 DIAGNOSIS — F0390 Unspecified dementia without behavioral disturbance: Secondary | ICD-10-CM | POA: Diagnosis not present

## 2017-11-25 DIAGNOSIS — N183 Chronic kidney disease, stage 3 (moderate): Secondary | ICD-10-CM | POA: Diagnosis not present

## 2017-11-25 DIAGNOSIS — R569 Unspecified convulsions: Secondary | ICD-10-CM | POA: Diagnosis not present

## 2017-11-25 DIAGNOSIS — I5032 Chronic diastolic (congestive) heart failure: Secondary | ICD-10-CM | POA: Diagnosis not present

## 2017-11-25 DIAGNOSIS — I130 Hypertensive heart and chronic kidney disease with heart failure and stage 1 through stage 4 chronic kidney disease, or unspecified chronic kidney disease: Secondary | ICD-10-CM | POA: Diagnosis not present

## 2017-11-25 DIAGNOSIS — E1122 Type 2 diabetes mellitus with diabetic chronic kidney disease: Secondary | ICD-10-CM | POA: Diagnosis not present

## 2017-11-25 DIAGNOSIS — F0390 Unspecified dementia without behavioral disturbance: Secondary | ICD-10-CM | POA: Diagnosis not present

## 2017-12-03 DIAGNOSIS — E1122 Type 2 diabetes mellitus with diabetic chronic kidney disease: Secondary | ICD-10-CM | POA: Diagnosis not present

## 2017-12-03 DIAGNOSIS — F0390 Unspecified dementia without behavioral disturbance: Secondary | ICD-10-CM | POA: Diagnosis not present

## 2017-12-03 DIAGNOSIS — R569 Unspecified convulsions: Secondary | ICD-10-CM | POA: Diagnosis not present

## 2017-12-03 DIAGNOSIS — I130 Hypertensive heart and chronic kidney disease with heart failure and stage 1 through stage 4 chronic kidney disease, or unspecified chronic kidney disease: Secondary | ICD-10-CM | POA: Diagnosis not present

## 2017-12-03 DIAGNOSIS — N183 Chronic kidney disease, stage 3 (moderate): Secondary | ICD-10-CM | POA: Diagnosis not present

## 2017-12-03 DIAGNOSIS — I5032 Chronic diastolic (congestive) heart failure: Secondary | ICD-10-CM | POA: Diagnosis not present

## 2018-02-17 DIAGNOSIS — I5030 Unspecified diastolic (congestive) heart failure: Secondary | ICD-10-CM | POA: Diagnosis not present

## 2018-02-17 DIAGNOSIS — F3342 Major depressive disorder, recurrent, in full remission: Secondary | ICD-10-CM | POA: Diagnosis not present

## 2018-02-17 DIAGNOSIS — Z0000 Encounter for general adult medical examination without abnormal findings: Secondary | ICD-10-CM | POA: Diagnosis not present

## 2018-02-17 DIAGNOSIS — E119 Type 2 diabetes mellitus without complications: Secondary | ICD-10-CM | POA: Diagnosis not present

## 2018-02-17 DIAGNOSIS — N183 Chronic kidney disease, stage 3 (moderate): Secondary | ICD-10-CM | POA: Diagnosis not present

## 2018-02-17 DIAGNOSIS — F329 Major depressive disorder, single episode, unspecified: Secondary | ICD-10-CM | POA: Diagnosis not present

## 2018-02-17 DIAGNOSIS — Z1389 Encounter for screening for other disorder: Secondary | ICD-10-CM | POA: Diagnosis not present

## 2018-02-17 DIAGNOSIS — F482 Pseudobulbar affect: Secondary | ICD-10-CM | POA: Diagnosis not present

## 2018-02-17 DIAGNOSIS — F0390 Unspecified dementia without behavioral disturbance: Secondary | ICD-10-CM | POA: Diagnosis not present

## 2018-02-17 DIAGNOSIS — M109 Gout, unspecified: Secondary | ICD-10-CM | POA: Diagnosis not present

## 2018-02-17 DIAGNOSIS — Z7189 Other specified counseling: Secondary | ICD-10-CM | POA: Diagnosis not present

## 2018-02-17 DIAGNOSIS — K219 Gastro-esophageal reflux disease without esophagitis: Secondary | ICD-10-CM | POA: Diagnosis not present

## 2018-02-17 DIAGNOSIS — G40909 Epilepsy, unspecified, not intractable, without status epilepticus: Secondary | ICD-10-CM | POA: Diagnosis not present

## 2018-04-22 DIAGNOSIS — F0390 Unspecified dementia without behavioral disturbance: Secondary | ICD-10-CM | POA: Diagnosis not present

## 2018-04-22 DIAGNOSIS — F3342 Major depressive disorder, recurrent, in full remission: Secondary | ICD-10-CM | POA: Diagnosis not present

## 2018-04-22 DIAGNOSIS — I5030 Unspecified diastolic (congestive) heart failure: Secondary | ICD-10-CM | POA: Diagnosis not present

## 2018-04-22 DIAGNOSIS — N183 Chronic kidney disease, stage 3 (moderate): Secondary | ICD-10-CM | POA: Diagnosis not present

## 2018-04-22 DIAGNOSIS — L750 Bromhidrosis: Secondary | ICD-10-CM | POA: Diagnosis not present

## 2018-04-22 DIAGNOSIS — F482 Pseudobulbar affect: Secondary | ICD-10-CM | POA: Diagnosis not present

## 2018-04-22 DIAGNOSIS — G40909 Epilepsy, unspecified, not intractable, without status epilepticus: Secondary | ICD-10-CM | POA: Diagnosis not present

## 2018-04-22 DIAGNOSIS — K219 Gastro-esophageal reflux disease without esophagitis: Secondary | ICD-10-CM | POA: Diagnosis not present

## 2018-08-18 DIAGNOSIS — I5030 Unspecified diastolic (congestive) heart failure: Secondary | ICD-10-CM | POA: Diagnosis not present

## 2018-08-18 DIAGNOSIS — Z23 Encounter for immunization: Secondary | ICD-10-CM | POA: Diagnosis not present

## 2018-08-18 DIAGNOSIS — F0390 Unspecified dementia without behavioral disturbance: Secondary | ICD-10-CM | POA: Diagnosis not present

## 2018-11-03 ENCOUNTER — Emergency Department (HOSPITAL_COMMUNITY): Payer: Medicare Other

## 2018-11-03 ENCOUNTER — Emergency Department (HOSPITAL_COMMUNITY)
Admission: EM | Admit: 2018-11-03 | Discharge: 2018-11-03 | Disposition: A | Discharge status: Home / Self Care | Payer: Medicare Other | Attending: Emergency Medicine | Admitting: Emergency Medicine

## 2018-11-03 ENCOUNTER — Other Ambulatory Visit: Payer: Self-pay

## 2018-11-03 DIAGNOSIS — Z91040 Latex allergy status: Secondary | ICD-10-CM | POA: Diagnosis not present

## 2018-11-03 DIAGNOSIS — I10 Essential (primary) hypertension: Secondary | ICD-10-CM | POA: Diagnosis not present

## 2018-11-03 DIAGNOSIS — Z87891 Personal history of nicotine dependence: Secondary | ICD-10-CM | POA: Diagnosis not present

## 2018-11-03 DIAGNOSIS — I959 Hypotension, unspecified: Secondary | ICD-10-CM | POA: Diagnosis not present

## 2018-11-03 DIAGNOSIS — N3000 Acute cystitis without hematuria: Secondary | ICD-10-CM | POA: Diagnosis not present

## 2018-11-03 DIAGNOSIS — Z79899 Other long term (current) drug therapy: Secondary | ICD-10-CM | POA: Insufficient documentation

## 2018-11-03 DIAGNOSIS — I110 Hypertensive heart disease with heart failure: Secondary | ICD-10-CM | POA: Diagnosis not present

## 2018-11-03 DIAGNOSIS — I509 Heart failure, unspecified: Secondary | ICD-10-CM | POA: Insufficient documentation

## 2018-11-03 DIAGNOSIS — I4510 Unspecified right bundle-branch block: Secondary | ICD-10-CM | POA: Diagnosis not present

## 2018-11-03 DIAGNOSIS — R4182 Altered mental status, unspecified: Secondary | ICD-10-CM | POA: Diagnosis not present

## 2018-11-03 DIAGNOSIS — F0390 Unspecified dementia without behavioral disturbance: Secondary | ICD-10-CM | POA: Diagnosis not present

## 2018-11-03 DIAGNOSIS — R509 Fever, unspecified: Secondary | ICD-10-CM | POA: Diagnosis not present

## 2018-11-03 DIAGNOSIS — R404 Transient alteration of awareness: Secondary | ICD-10-CM | POA: Diagnosis not present

## 2018-11-03 LAB — COMPREHENSIVE METABOLIC PANEL
ALT: 28 U/L (ref 0–44)
AST: 27 U/L (ref 15–41)
Albumin: 3.3 g/dL — ABNORMAL LOW (ref 3.5–5.0)
Alkaline Phosphatase: 80 U/L (ref 38–126)
Anion gap: 11 (ref 5–15)
BUN: 42 mg/dL — AB (ref 8–23)
CHLORIDE: 111 mmol/L (ref 98–111)
CO2: 17 mmol/L — AB (ref 22–32)
Calcium: 9 mg/dL (ref 8.9–10.3)
Creatinine, Ser: 1.69 mg/dL — ABNORMAL HIGH (ref 0.44–1.00)
GFR calc Af Amer: 31 mL/min — ABNORMAL LOW (ref >60)
GFR calc non Af Amer: 27 mL/min — ABNORMAL LOW (ref >60)
Glucose, Bld: 92 mg/dL (ref 70–99)
Potassium: 4.4 mmol/L (ref 3.5–5.1)
SODIUM: 139 mmol/L (ref 135–145)
Total Bilirubin: 0.6 mg/dL (ref 0.3–1.2)
Total Protein: 7 g/dL (ref 6.5–8.1)

## 2018-11-03 LAB — URINALYSIS, ROUTINE W REFLEX MICROSCOPIC
Bilirubin Urine: NEGATIVE
GLUCOSE, UA: NEGATIVE mg/dL
Ketones, ur: NEGATIVE mg/dL
Nitrite: POSITIVE — AB
PROTEIN: 30 mg/dL — AB
SPECIFIC GRAVITY, URINE: 1.011 (ref 1.005–1.030)
pH: 6 (ref 5.0–8.0)

## 2018-11-03 LAB — CBC
HEMATOCRIT: 37.6 % (ref 36.0–46.0)
Hemoglobin: 11.5 g/dL — ABNORMAL LOW (ref 12.0–15.0)
MCH: 27.9 pg (ref 26.0–34.0)
MCHC: 30.6 g/dL (ref 30.0–36.0)
MCV: 91.3 fL (ref 80.0–100.0)
Platelets: 183 10*3/uL (ref 150–400)
RBC: 4.12 MIL/uL (ref 3.87–5.11)
RDW: 14 % (ref 11.5–15.5)
WBC: 7 10*3/uL (ref 4.0–10.5)
nRBC: 0 % (ref 0.0–0.2)

## 2018-11-03 LAB — INFLUENZA PANEL BY PCR (TYPE A & B)
Influenza A By PCR: NEGATIVE
Influenza B By PCR: NEGATIVE

## 2018-11-03 MED ORDER — SODIUM CHLORIDE 0.9 % IV BOLUS (SEPSIS)
500.0000 mL | Freq: Once | INTRAVENOUS | Status: AC
Start: 2018-11-03 — End: 2018-11-03
  Administered 2018-11-03: 500 mL via INTRAVENOUS

## 2018-11-03 MED ORDER — SODIUM CHLORIDE 0.9 % IV SOLN
1000.0000 mL | INTRAVENOUS | Status: DC
  Administered 2018-11-03: 1000 mL via INTRAVENOUS

## 2018-11-03 MED ORDER — CEPHALEXIN 250 MG PO CAPS: 250.0000 mg | ORAL_CAPSULE | Freq: Three times a day (TID) | ORAL | 0 refills | Status: DC

## 2018-11-03 MED ORDER — SODIUM CHLORIDE 0.9 % IV SOLN
1.0000 g | Freq: Once | INTRAVENOUS | Status: AC
  Administered 2018-11-03: 1 g via INTRAVENOUS
  Filled 2018-11-03: qty 10

## 2018-11-03 NOTE — ED Triage Notes (Signed)
Pt presents to ED with EMS from home for AMS. Pt non verbal at baseline but family states she has been less active, not getting up off the couch since she sat down earlier today with decline in mental status in the last 2-3 hours. Hx TIA, not anticoagulated. No focal neuro deficits. Pt warm to touch.

## 2018-11-03 NOTE — Discharge Instructions (Addendum)
Take the antibiotics as prescribed, follow-up with your primary care doctor to make sure the infection clears, return for fever, worsening symptoms

## 2018-11-03 NOTE — ED Notes (Signed)
Attempt x3 for second set of blood cultures, unsuccessful.

## 2018-11-03 NOTE — ED Notes (Signed)
Patient transported to CT 
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
## 2018-11-04 ENCOUNTER — Telehealth (HOSPITAL_BASED_OUTPATIENT_CLINIC_OR_DEPARTMENT_OTHER): Payer: Self-pay | Admitting: Emergency Medicine

## 2018-11-04 ENCOUNTER — Observation Stay (HOSPITAL_COMMUNITY)
Admission: EM | Admit: 2018-11-04 | Discharge: 2018-11-05 | Disposition: A | Discharge status: Home / Self Care | Payer: Medicare Other | Attending: Internal Medicine | Admitting: Internal Medicine

## 2018-11-04 DIAGNOSIS — N184 Chronic kidney disease, stage 4 (severe): Secondary | ICD-10-CM | POA: Diagnosis not present

## 2018-11-04 DIAGNOSIS — H42 Glaucoma in diseases classified elsewhere: Secondary | ICD-10-CM | POA: Diagnosis not present

## 2018-11-04 DIAGNOSIS — Z87891 Personal history of nicotine dependence: Secondary | ICD-10-CM | POA: Insufficient documentation

## 2018-11-04 DIAGNOSIS — Z91040 Latex allergy status: Secondary | ICD-10-CM | POA: Insufficient documentation

## 2018-11-04 DIAGNOSIS — B9620 Unspecified Escherichia coli [E. coli] as the cause of diseases classified elsewhere: Secondary | ICD-10-CM | POA: Insufficient documentation

## 2018-11-04 DIAGNOSIS — R410 Disorientation, unspecified: Secondary | ICD-10-CM | POA: Diagnosis not present

## 2018-11-04 DIAGNOSIS — I130 Hypertensive heart and chronic kidney disease with heart failure and stage 1 through stage 4 chronic kidney disease, or unspecified chronic kidney disease: Secondary | ICD-10-CM | POA: Diagnosis not present

## 2018-11-04 DIAGNOSIS — G40909 Epilepsy, unspecified, not intractable, without status epilepticus: Secondary | ICD-10-CM | POA: Diagnosis not present

## 2018-11-04 DIAGNOSIS — E7800 Pure hypercholesterolemia, unspecified: Secondary | ICD-10-CM | POA: Insufficient documentation

## 2018-11-04 DIAGNOSIS — M109 Gout, unspecified: Secondary | ICD-10-CM | POA: Diagnosis not present

## 2018-11-04 DIAGNOSIS — Z888 Allergy status to other drugs, medicaments and biological substances status: Secondary | ICD-10-CM | POA: Insufficient documentation

## 2018-11-04 DIAGNOSIS — N390 Urinary tract infection, site not specified: Secondary | ICD-10-CM | POA: Diagnosis not present

## 2018-11-04 DIAGNOSIS — G309 Alzheimer's disease, unspecified: Secondary | ICD-10-CM | POA: Diagnosis not present

## 2018-11-04 DIAGNOSIS — I5032 Chronic diastolic (congestive) heart failure: Secondary | ICD-10-CM | POA: Diagnosis not present

## 2018-11-04 DIAGNOSIS — F0390 Unspecified dementia without behavioral disturbance: Secondary | ICD-10-CM | POA: Diagnosis present

## 2018-11-04 DIAGNOSIS — F419 Anxiety disorder, unspecified: Secondary | ICD-10-CM | POA: Insufficient documentation

## 2018-11-04 DIAGNOSIS — Z79899 Other long term (current) drug therapy: Secondary | ICD-10-CM | POA: Diagnosis not present

## 2018-11-04 DIAGNOSIS — E1122 Type 2 diabetes mellitus with diabetic chronic kidney disease: Secondary | ICD-10-CM | POA: Insufficient documentation

## 2018-11-04 DIAGNOSIS — N189 Chronic kidney disease, unspecified: Secondary | ICD-10-CM | POA: Diagnosis present

## 2018-11-04 DIAGNOSIS — F329 Major depressive disorder, single episode, unspecified: Secondary | ICD-10-CM | POA: Diagnosis not present

## 2018-11-04 DIAGNOSIS — E119 Type 2 diabetes mellitus without complications: Secondary | ICD-10-CM

## 2018-11-04 DIAGNOSIS — E1139 Type 2 diabetes mellitus with other diabetic ophthalmic complication: Secondary | ICD-10-CM | POA: Diagnosis not present

## 2018-11-04 DIAGNOSIS — R7881 Bacteremia: Principal | ICD-10-CM | POA: Insufficient documentation

## 2018-11-04 DIAGNOSIS — I10 Essential (primary) hypertension: Secondary | ICD-10-CM | POA: Diagnosis present

## 2018-11-04 LAB — CBC WITH DIFFERENTIAL/PLATELET
Abs Immature Granulocytes: 0.03 10*3/uL (ref 0.00–0.07)
Basophils Absolute: 0 10*3/uL (ref 0.0–0.1)
Basophils Relative: 0 %
Eosinophils Absolute: 0 10*3/uL (ref 0.0–0.5)
Eosinophils Relative: 0 %
HCT: 40.8 % (ref 36.0–46.0)
Hemoglobin: 12.6 g/dL (ref 12.0–15.0)
Immature Granulocytes: 0 %
Lymphocytes Relative: 16 %
Lymphs Abs: 1.3 10*3/uL (ref 0.7–4.0)
MCH: 28.3 pg (ref 26.0–34.0)
MCHC: 30.9 g/dL (ref 30.0–36.0)
MCV: 91.5 fL (ref 80.0–100.0)
Monocytes Absolute: 0.6 10*3/uL (ref 0.1–1.0)
Monocytes Relative: 7 %
Neutro Abs: 6.4 10*3/uL (ref 1.7–7.7)
Neutrophils Relative %: 77 %
Platelets: 211 10*3/uL (ref 150–400)
RBC: 4.46 MIL/uL (ref 3.87–5.11)
RDW: 14.2 % (ref 11.5–15.5)
WBC: 8.4 10*3/uL (ref 4.0–10.5)
nRBC: 0 % (ref 0.0–0.2)

## 2018-11-04 LAB — COMPREHENSIVE METABOLIC PANEL
ALT: 29 U/L (ref 0–44)
AST: 29 U/L (ref 15–41)
Albumin: 3.5 g/dL (ref 3.5–5.0)
Alkaline Phosphatase: 90 U/L (ref 38–126)
Anion gap: 10 (ref 5–15)
BUN: 37 mg/dL — ABNORMAL HIGH (ref 8–23)
CO2: 21 mmol/L — ABNORMAL LOW (ref 22–32)
Calcium: 9.3 mg/dL (ref 8.9–10.3)
Chloride: 108 mmol/L (ref 98–111)
Creatinine, Ser: 1.85 mg/dL — ABNORMAL HIGH (ref 0.44–1.00)
GFR calc Af Amer: 28 mL/min — ABNORMAL LOW (ref >60)
GFR calc non Af Amer: 24 mL/min — ABNORMAL LOW (ref >60)
Glucose, Bld: 116 mg/dL — ABNORMAL HIGH (ref 70–99)
Potassium: 3.8 mmol/L (ref 3.5–5.1)
Sodium: 139 mmol/L (ref 135–145)
Total Bilirubin: 0.4 mg/dL (ref 0.3–1.2)
Total Protein: 8 g/dL (ref 6.5–8.1)

## 2018-11-04 LAB — BLOOD CULTURE ID PANEL (REFLEXED)
ACINETOBACTER BAUMANNII: NOT DETECTED
CANDIDA ALBICANS: NOT DETECTED
CANDIDA GLABRATA: NOT DETECTED
CANDIDA KRUSEI: NOT DETECTED
CANDIDA PARAPSILOSIS: NOT DETECTED
Candida tropicalis: NOT DETECTED
Carbapenem resistance: NOT DETECTED
ENTEROBACTER CLOACAE COMPLEX: NOT DETECTED
ESCHERICHIA COLI: DETECTED — AB
Enterobacteriaceae species: DETECTED — AB
Enterococcus species: NOT DETECTED
Haemophilus influenzae: NOT DETECTED
KLEBSIELLA PNEUMONIAE: NOT DETECTED
Klebsiella oxytoca: NOT DETECTED
Listeria monocytogenes: NOT DETECTED
Neisseria meningitidis: NOT DETECTED
PROTEUS SPECIES: NOT DETECTED
Pseudomonas aeruginosa: NOT DETECTED
Serratia marcescens: NOT DETECTED
Staphylococcus aureus (BCID): NOT DETECTED
Staphylococcus species: NOT DETECTED
Streptococcus agalactiae: NOT DETECTED
Streptococcus pneumoniae: NOT DETECTED
Streptococcus pyogenes: NOT DETECTED
Streptococcus species: NOT DETECTED

## 2018-11-04 LAB — I-STAT CG4 LACTIC ACID, ED: Lactic Acid, Venous: 1.15 mmol/L (ref 0.5–1.9)

## 2018-11-04 MED ORDER — SENNOSIDES-DOCUSATE SODIUM 8.6-50 MG PO TABS
1.0000 | ORAL_TABLET | Freq: Every day | ORAL | Status: DC
  Administered 2018-11-05: 1 via ORAL
  Filled 2018-11-04: qty 1

## 2018-11-04 MED ORDER — BRIMONIDINE TARTRATE 0.15 % OP SOLN
1.0000 [drp] | Freq: Two times a day (BID) | OPHTHALMIC | Status: DC
  Administered 2018-11-04 – 2018-11-05 (×2): 1 [drp] via OPHTHALMIC
  Filled 2018-11-04: qty 5

## 2018-11-04 MED ORDER — SODIUM CHLORIDE 0.9 % IV SOLN
1.0000 g | Freq: Once | INTRAVENOUS | Status: AC
  Administered 2018-11-04: 1 g via INTRAVENOUS
  Filled 2018-11-04: qty 10

## 2018-11-04 MED ORDER — BISACODYL 10 MG RE SUPP: 10.0000 mg | Freq: Every day | RECTAL | Status: DC | PRN

## 2018-11-04 MED ORDER — FLUTICASONE PROPIONATE 50 MCG/ACT NA SUSP
1.0000 | Freq: Every day | NASAL | Status: DC
  Administered 2018-11-05: 1 via NASAL
  Filled 2018-11-04: qty 16

## 2018-11-04 MED ORDER — SODIUM CHLORIDE 0.9 % IV SOLN
INTRAVENOUS | Status: DC
  Administered 2018-11-04 – 2018-11-05 (×2): via INTRAVENOUS

## 2018-11-04 MED ORDER — DOCUSATE SODIUM 100 MG PO CAPS: 100.0000 mg | ORAL_CAPSULE | Freq: Two times a day (BID) | ORAL | Status: DC | PRN

## 2018-11-04 MED ORDER — NUTRITIONAL DRINK PO LIQD: 1.0000 | Freq: Three times a day (TID) | ORAL | Status: DC

## 2018-11-04 MED ORDER — ESCITALOPRAM OXALATE 10 MG PO TABS
10.0000 mg | ORAL_TABLET | Freq: Every day | ORAL | Status: DC
  Administered 2018-11-05: 10 mg via ORAL
  Filled 2018-11-04: qty 1

## 2018-11-04 MED ORDER — ONDANSETRON HCL 4 MG/2ML IJ SOLN: 4.0000 mg | Freq: Four times a day (QID) | INTRAMUSCULAR | Status: DC | PRN

## 2018-11-04 MED ORDER — ENSURE ENLIVE PO LIQD
237.0000 mL | Freq: Three times a day (TID) | ORAL | Status: DC
  Administered 2018-11-04 – 2018-11-05 (×2): 237 mL via ORAL

## 2018-11-04 MED ORDER — MIRTAZAPINE 30 MG PO TABS
30.0000 mg | ORAL_TABLET | Freq: Every day | ORAL | Status: DC
  Administered 2018-11-04: 30 mg via ORAL
  Filled 2018-11-04: qty 1

## 2018-11-04 MED ORDER — BISACODYL 5 MG PO TBEC: 5.0000 mg | DELAYED_RELEASE_TABLET | Freq: Every day | ORAL | Status: DC

## 2018-11-04 MED ORDER — SODIUM CHLORIDE 0.9 % IV SOLN
1.0000 g | INTRAVENOUS | Status: DC
  Filled 2018-11-04: qty 10

## 2018-11-04 MED ORDER — ONDANSETRON HCL 4 MG PO TABS: 4.0000 mg | ORAL_TABLET | Freq: Four times a day (QID) | ORAL | Status: DC | PRN

## 2018-11-04 MED ORDER — DIVALPROEX SODIUM 125 MG PO CSDR
125.0000 mg | DELAYED_RELEASE_CAPSULE | Freq: Two times a day (BID) | ORAL | Status: DC
  Administered 2018-11-04 – 2018-11-05 (×2): 125 mg via ORAL
  Filled 2018-11-04 (×2): qty 1

## 2018-11-04 MED ORDER — HEPARIN SODIUM (PORCINE) 5000 UNIT/ML IJ SOLN
5000.0000 [IU] | Freq: Three times a day (TID) | INTRAMUSCULAR | Status: DC
  Administered 2018-11-04 – 2018-11-05 (×2): 5000 [IU] via SUBCUTANEOUS
  Filled 2018-11-04 (×2): qty 1
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
## 2018-11-04 NOTE — ED Notes (Signed)
Per MD Tegeler- OK to proceed w/ IV abx without repeat urine. Family reports it will be traumatic for her to be cath'd again and she is unable to urinate independently, will attempt purewick for sample

## 2018-11-04 NOTE — ED Triage Notes (Signed)
Pt here with daughter after receiving phone call to inform the pt that she tested positive for e.coli and to come back to the ER. VSS.

## 2018-11-04 NOTE — ED Notes (Signed)
Pt on the bedside commode with help of the daughter, pt and family are aware a sample needs to be collected.
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
## 2018-11-05 DIAGNOSIS — R7881 Bacteremia: Principal | ICD-10-CM

## 2018-11-05 DIAGNOSIS — G308 Other Alzheimer's disease: Secondary | ICD-10-CM

## 2018-11-05 DIAGNOSIS — F0280 Dementia in other diseases classified elsewhere without behavioral disturbance: Secondary | ICD-10-CM | POA: Diagnosis not present

## 2018-11-05 DIAGNOSIS — N184 Chronic kidney disease, stage 4 (severe): Secondary | ICD-10-CM | POA: Diagnosis not present

## 2018-11-05 DIAGNOSIS — I10 Essential (primary) hypertension: Secondary | ICD-10-CM | POA: Diagnosis not present

## 2018-11-05 LAB — CBC
HCT: 32.4 % — ABNORMAL LOW (ref 36.0–46.0)
Hemoglobin: 10.1 g/dL — ABNORMAL LOW (ref 12.0–15.0)
MCH: 28.1 pg (ref 26.0–34.0)
MCHC: 31.2 g/dL (ref 30.0–36.0)
MCV: 90 fL (ref 80.0–100.0)
Platelets: 173 10*3/uL (ref 150–400)
RBC: 3.6 MIL/uL — ABNORMAL LOW (ref 3.87–5.11)
RDW: 14 % (ref 11.5–15.5)
WBC: 6 10*3/uL (ref 4.0–10.5)
nRBC: 0 % (ref 0.0–0.2)

## 2018-11-05 LAB — BASIC METABOLIC PANEL
Anion gap: 8 (ref 5–15)
BUN: 34 mg/dL — ABNORMAL HIGH (ref 8–23)
CO2: 21 mmol/L — ABNORMAL LOW (ref 22–32)
Calcium: 8.8 mg/dL — ABNORMAL LOW (ref 8.9–10.3)
Chloride: 112 mmol/L — ABNORMAL HIGH (ref 98–111)
Creatinine, Ser: 1.79 mg/dL — ABNORMAL HIGH (ref 0.44–1.00)
GFR calc Af Amer: 29 mL/min — ABNORMAL LOW (ref >60)
GFR calc non Af Amer: 25 mL/min — ABNORMAL LOW (ref >60)
Glucose, Bld: 87 mg/dL (ref 70–99)
Potassium: 4 mmol/L (ref 3.5–5.1)
Sodium: 141 mmol/L (ref 135–145)

## 2018-11-05 MED ORDER — CEFDINIR 300 MG PO CAPS: 300.0000 mg | ORAL_CAPSULE | Freq: Every day | ORAL | 0 refills | Status: DC

## 2018-11-05 MED ORDER — SODIUM CHLORIDE 0.9 % IV SOLN
2.0000 g | INTRAVENOUS | Status: DC
  Administered 2018-11-05: 2 g via INTRAVENOUS
  Filled 2018-11-05: qty 20

## 2018-11-05 NOTE — Discharge Summary (Addendum)
PATIENT DETAILS Name: Tanya Smith Age: 85 y.o. Sex: female Date of Birth: 1930-01-25 MRN: [id number]. Admitting Physician: Janora Norlander, MD JOI:NOMVEHM, Jenny Reichmann, MD  Admit Date: 11/04/2018 Discharge date: 11/05/2018  Recommendations for Outpatient Follow-up:  1. Follow up with PCP in 1-2 weeks 2. Please obtain BMP/CBC in one week 3. Please repeat blood cultures once patient completes a course of antimicrobial therapy  Admitted From:  Home  Disposition: [place]: No  Equipment/Devices: None  Discharge Condition: Stable  CODE STATUS: FULL CODE  Diet recommendation:  Heart Healthy   Brief Summary: See H&P, Labs, Consult and Test reports for all details in brief, patient is a hypertension, dementia, stage IV CKD, chronic diastolic heart failure-who presented to the ED on 1/8 with confusion, foul-smelling urine.  She was given Rocephin-monitored in the emergency room and felt markedly better and was discharged home on Keflex.  Her blood cultures drawn on 1/8 subsequently came back positive, she was then brought back and admitted to the hospitalist service.  Brief Hospital Course: E. coli bacteremia: Likely from UTI-she has no other foci of infection apparent clinically.  Her abdominal exam is benign, LFTs are within normal limits.  Daughter does acknowledge foul-smelling urine.  Patient was admitted and started on IV Rocephin-urine cultures are positive for E. coli-sensitivities are pending.  Patient has advanced dementia-and has very delirious episodes that are worse at night.  Daughter wants to take her home (option of staying in the hospital till tomorrow discussed)-I will place the patient on [place] and discharge her home on 9 more days of therapy.  Patient is stable-no fever-no leukocytosis-and is easily able to take oral medications.  This MD will follow sensitivities and get in touch with the patient's daughter.  Family is well aware that if in the unlikely event  that sensitivities turn out to be resistant to oral antimicrobial therapy, patient will need to come back to the hospital for further continued IV antibiotic treatment.  Family is accepting of this scenario.  Family will be obviously providing 24/7 care and monitoring while at home.  CKD stage IV: Creatinine close to usual baseline-follow periodically  Hypertension: Blood pressure controlled-continue usual antihypertensives  Dementia with delirium: Patient currently in mittens-apparently got confused more than her usual baseline at night.  Daughter acknowledges worsening confusion at home as well but not to this degree while in this hospital.  See above-given advanced dementia-delirium-best to keep patient in familiar surroundings.  Procedures/Studies: None  Discharge Diagnoses:  Principal Problem:   Bacteremia Active Problems:   Hypertension   Diabetes mellitus ([place])   UTI (urinary tract infection)   Dementia ([place])   Chronic kidney disease   Discharge Instructions:  Activity:  As tolerated   Discharge Instructions    Diet - low sodium heart healthy   Complete by:  As directed    Discharge instructions   Complete by:  As directed    Follow with Primary MD  Lavone Orn, MD in 1 week  Please ask your primary care practitioner to repeat blood cultures once you have completed a course of antimicrobial therapy  Please get a complete blood count and chemistry panel checked by your Primary MD at your next visit, and again as instructed by your Primary MD.  Get Medicines reviewed and adjusted: Please take all your medications with you for your next visit with your Primary MD  Laboratory/radiological data: Please request your Primary MD to go over all hospital tests and procedure/radiological  results at the follow up, please ask your Primary MD to get all Hospital records sent to his/her office.  In some cases, they will be blood work, cultures and biopsy results pending at the  time of your discharge. Please request that your primary care M.D. follows up on these results.  Also Note the following: If you experience worsening of your admission symptoms, develop shortness of breath, life threatening emergency, suicidal or homicidal thoughts you must seek medical attention immediately by calling 911 or calling your MD immediately  if symptoms less severe.  You must read complete instructions/literature along with all the possible adverse reactions/side effects for all the Medicines you take and that have been prescribed to you. Take any new Medicines after you have completely understood and accpet all the possible adverse reactions/side effects.   Do not drive when taking Pain medications or sleeping medications (Benzodaizepines)  Do not take more than prescribed Pain, Sleep and Anxiety Medications. It is not advisable to combine anxiety,sleep and pain medications without talking with your primary care practitioner  Special Instructions: If you have smoked or chewed Tobacco  in the last 2 yrs please stop smoking, stop any regular Alcohol  and or any Recreational drug use.  Wear Seat belts while driving.  Please note: You were cared for by a hospitalist during your hospital stay. Once you are discharged, your primary care physician will handle any further medical issues. Please note that NO REFILLS for any discharge medications will be authorized once you are discharged, as it is imperative that you return to your primary care physician (or establish a relationship with a primary care physician if you do not have one) for your post hospital discharge needs so that they can reassess your need for medications and monitor your lab values.   Increase activity slowly   Complete by:  As directed      Allergies as of 11/05/2018      Reactions   Ace Inhibitors Other (See Comments)   Furosemide Other (See Comments)   Unknown. Per MAR. Can take brand name Lasix.    Sertraline  Other (See Comments)   Unknown; per physician's orders    Latex Rash   Other Rash   Soap & deodorant.   Tape Rash      Medication List    STOP taking these medications   cephALEXin 250 MG capsule Commonly known as:  KEFLEX   ciprofloxacin 500 MG tablet Commonly known as:  CIPRO   furosemide 20 MG tablet Commonly known as:  LASIX     TAKE these medications   bisacodyl 5 MG EC tablet Generic drug:  bisacodyl Take 5 mg by mouth at bedtime.   brimonidine 0.1 % Soln Commonly known as:  ALPHAGAN P Place 1 drop into both eyes 2 (two) times daily.   cefdinir 300 MG capsule Commonly known as:  OMNICEF Take 1 capsule (300 mg total) by mouth daily.   cetirizine 10 MG tablet Commonly known as:  ZYRTEC Take 10 mg by mouth daily.   divalproex 125 MG capsule Commonly known as:  DEPAKOTE SPRINKLE Take 125 mg by mouth 2 (two) times daily.   escitalopram 20 MG tablet Commonly known as:  LEXAPRO Take 10 mg by mouth daily.   fluconazole 150 MG tablet Commonly known as:  DIFLUCAN Take 1 tablet by mouth on first day of antibiotic and then take 1 tablet by mouth on last day of antibiotic   fluticasone 50 MCG/ACT nasal spray Commonly  known as:  FLONASE Place 1 spray into both nostrils daily.   hydrochlorothiazide 12.5 MG tablet Commonly known as:  HYDRODIURIL Take 1 tablet (12.5 mg total) by mouth daily. What changed:  how much to take   mirtazapine 30 MG tablet Commonly known as:  REMERON Take 30 mg by mouth at bedtime.   NUTRITIONAL DRINK Liqd Take 1 Container by mouth 3 (three) times daily. 0800, 1400, 2000   senna-docusate 8.6-50 MG tablet Commonly known as:  Senokot-S Take 1 tablet by mouth daily.      Follow-up Information    Lavone Orn, MD. Schedule an appointment as soon as possible for a visit in 1 week(s).   Specialty:  Internal Medicine Contact information: [address], [address] [place] [address] [phone]          Allergies    Allergen Reactions  . Ace Inhibitors Other (See Comments)  . Furosemide Other (See Comments)    Unknown. Per MAR. Can take brand name Lasix.   . Sertraline Other (See Comments)    Unknown; per physician's orders   . Latex Rash  . Other Rash    Soap & deodorant.  . Tape Rash    Consultations:   None  Other Procedures/Studies: Dg Chest 2 View  Result Date: 11/03/2018 CLINICAL DATA:  Fever EXAM: CHEST - 2 VIEW COMPARISON:  09/22/2017 chest radiograph. FINDINGS: Stable cardiomediastinal silhouette with top-normal heart size. No pneumothorax. No pleural effusion. Lungs appear clear, with no acute consolidative airspace disease and no pulmonary edema. IMPRESSION: No active cardiopulmonary disease. Electronically Signed   By: Ilona Sorrel M.D.   On: 11/03/2018 20:10   Ct Head Wo Contrast  Result Date: 11/03/2018 CLINICAL DATA:  Altered mental status. Patient nonverbal at baseline but less active. EXAM: CT HEAD WITHOUT CONTRAST TECHNIQUE: Contiguous axial images were obtained from the base of the skull through the vertex without intravenous contrast. COMPARISON:  09/22/2017 FINDINGS: BRAIN: The study is slightly motion degraded similar to prior limiting assessment. There is sulcal and ventricular prominence consistent with superficial and central atrophy. Chronic right temporal lobe and small left cerebellar infarcts. No intraparenchymal hemorrhage, mass effect nor midline shift. Periventricular and subcortical white matter hypodensities consistent with chronic moderate small vessel ischemic disease are identified. No acute large vascular territory infarcts. No abnormal extra-axial fluid collections. Basal cisterns are not effaced and midline. VASCULAR: No hyperdense vessels.  No unexpected calcifications. SKULL: No skull fracture. No significant scalp soft tissue swelling. SINUSES/ORBITS: The mastoid air-cells are clear. The included paranasal sinuses are well-aerated.The included ocular globes and  orbital contents are non-suspicious. OTHER: None. IMPRESSION: Atrophy with chronic moderate small vessel ischemic disease. 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 Electronically Signed   By: Ashley Royalty M.D.   On: 11/03/2018 19:57      TODAY-DAY OF DISCHARGE:  Subjective:   Tanya Smith today remains pleasantly confused  Objective:   Blood pressure (!) 186/77, pulse 65, temperature 98.4 F (36.9 C), temperature source Oral, resp. rate 19, SpO2 100 %.  Intake/Output Summary (Last 24 hours) at 11/05/2018 0938 Last data filed at 11/05/2018 0603 Gross per 24 hour  Intake 652.23 ml  Output 1 ml  Net 651.23 ml   There were no vitals filed for this visit.  Exam: Awake Alert, but confused, no new F.N deficits, Normal affect [place].AT,PERRAL Supple Neck,No JVD, No cervical lymphadenopathy appriciated.  Symmetrical Chest Kaus movement, Good air movement bilaterally, CTAB RRR,No Gallops,Rubs or new Murmurs, No Parasternal Heave +  ve B.Sounds, Abd Soft, Non tender, No organomegaly appriciated, No rebound -guarding or rigidity. No Cyanosis, Clubbing or edema, No new Rash or bruise   PERTINENT RADIOLOGIC STUDIES: Dg Chest 2 View  Result Date: 11/03/2018 CLINICAL DATA:  Fever EXAM: CHEST - 2 VIEW COMPARISON:  09/22/2017 chest radiograph. FINDINGS: Stable cardiomediastinal silhouette with top-normal heart size. No pneumothorax. No pleural effusion. Lungs appear clear, with no acute consolidative airspace disease and no pulmonary edema. IMPRESSION: No active cardiopulmonary disease. Electronically Signed   By: Ilona Sorrel M.D.   On: 11/03/2018 20:10   Ct Head Wo Contrast  Result Date: 11/03/2018 CLINICAL DATA:  Altered mental status. Patient nonverbal at baseline but less active. EXAM: CT HEAD WITHOUT CONTRAST TECHNIQUE: Contiguous axial images were obtained from the base of the skull through the vertex without intravenous contrast. COMPARISON:  09/22/2017 FINDINGS: BRAIN: The study is  slightly motion degraded similar to prior limiting assessment. There is sulcal and ventricular prominence consistent with superficial and central atrophy. Chronic right temporal lobe and small left cerebellar infarcts. No intraparenchymal hemorrhage, mass effect nor midline shift. Periventricular and subcortical white matter hypodensities consistent with chronic moderate small vessel ischemic disease are identified. No acute large vascular territory infarcts. No abnormal extra-axial fluid collections. Basal cisterns are not effaced and midline. VASCULAR: No hyperdense vessels.  No unexpected calcifications. SKULL: No skull fracture. No significant scalp soft tissue swelling. SINUSES/ORBITS: The mastoid air-cells are clear. The included paranasal sinuses are well-aerated.The included ocular globes and orbital contents are non-suspicious. OTHER: None. IMPRESSION: Atrophy with chronic moderate small vessel ischemic disease. 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 Electronically Signed   By: Ashley Royalty M.D.   On: 11/03/2018 19:57     PERTINENT LAB RESULTS: CBC: Recent Labs    11/04/18 1731 11/05/18 0644  WBC 8.4 6.0  HGB 12.6 10.1*  HCT 40.8 32.4*  PLT 211 173   CMET CMP     Component Value Date/Time   NA 141 11/05/2018 0644   K 4.0 11/05/2018 0644   CL 112 (H) 11/05/2018 0644   CO2 21 (L) 11/05/2018 0644   GLUCOSE 87 11/05/2018 0644   BUN 34 (H) 11/05/2018 0644   CREATININE 1.79 (H) 11/05/2018 0644   CALCIUM 8.8 (L) 11/05/2018 0644   PROT 8.0 11/04/2018 1731   ALBUMIN 3.5 11/04/2018 1731   AST 29 11/04/2018 1731   ALT 29 11/04/2018 1731   ALKPHOS 90 11/04/2018 1731   BILITOT 0.4 11/04/2018 1731   GFRNONAA 25 (L) 11/05/2018 0644   GFRAA 29 (L) 11/05/2018 0644    GFR Estimated Creatinine Clearance: 20.9 mL/min (A) (by C-G formula based on SCr of 1.79 mg/dL (H)). No results for input(s): LIPASE, AMYLASE in the last 72 hours. No results for input(s): CKTOTAL, CKMB,  CKMBINDEX, TROPONINI in the last 72 hours. Invalid input(s): POCBNP No results for input(s): DDIMER in the last 72 hours. No results for input(s): HGBA1C in the last 72 hours. No results for input(s): CHOL, HDL, LDLCALC, TRIG, CHOLHDL, LDLDIRECT in the last 72 hours. No results for input(s): TSH, T4TOTAL, T3FREE, THYROIDAB in the last 72 hours.  Invalid input(s): FREET3 No results for input(s): VITAMINB12, FOLATE, FERRITIN, TIBC, IRON, RETICCTPCT in the last 72 hours. Coags: No results for input(s): INR in the last 72 hours.  Invalid input(s): PT Microbiology: Recent Results (from the past 240 hour(s))  Blood culture (routine x 2)     Status: Abnormal (Preliminary result)   Collection Time: 11/03/18  8:21 PM  Result Value Ref Range Status   Specimen Description BLOOD BLOOD LEFT FOREARM  Final   Special Requests   Final    BOTTLES DRAWN AEROBIC ONLY Blood Culture results may not be optimal due to an inadequate volume of blood received in culture bottles   Culture  Setup Time   Final    GRAM NEGATIVE RODS AEROBIC BOTTLE ONLY Organism ID to follow CRITICAL RESULT CALLED TO, READ BACK BY AND VERIFIED WITH: Rosemarie Ax RN 12:40 11/04/18 ([email or web address])    Culture (A)  Final    ESCHERICHIA COLI SUSCEPTIBILITIES TO FOLLOW Performed at [facility], [place] [address] [place], [place] [address]    Report Status PENDING  Incomplete  Blood Culture ID Panel (Reflexed)     Status: Abnormal   Collection Time: 11/03/18  8:21 PM  Result Value Ref Range Status   Enterococcus species NOT DETECTED NOT DETECTED Final   Listeria monocytogenes NOT DETECTED NOT DETECTED Final   Staphylococcus species NOT DETECTED NOT DETECTED Final   Staphylococcus aureus (BCID) NOT DETECTED NOT DETECTED Final   Streptococcus species NOT DETECTED NOT DETECTED Final   Streptococcus agalactiae NOT DETECTED NOT DETECTED Final   Streptococcus pneumoniae NOT DETECTED NOT DETECTED Final   Streptococcus pyogenes NOT DETECTED  NOT DETECTED Final   Acinetobacter baumannii NOT DETECTED NOT DETECTED Final   Enterobacteriaceae species DETECTED (A) NOT DETECTED Final    Comment: Enterobacteriaceae represent a large family of gram-negative bacteria, not a single organism. CRITICAL RESULT CALLED TO, READ BACK BY AND VERIFIED WITH: Rosemarie Ax RN 12:40 11/04/18 ([email or web address])    Enterobacter cloacae complex NOT DETECTED NOT DETECTED Final   Escherichia coli DETECTED (A) NOT DETECTED Final    Comment: CRITICAL RESULT CALLED TO, READ BACK BY AND VERIFIED WITH: Rosemarie Ax RN 12:40 11/04/18 ([email or web address])    Klebsiella oxytoca NOT DETECTED NOT DETECTED Final   Klebsiella pneumoniae NOT DETECTED NOT DETECTED Final   Proteus species NOT DETECTED NOT DETECTED Final   Serratia marcescens NOT DETECTED NOT DETECTED Final   Carbapenem resistance NOT DETECTED NOT DETECTED Final   Haemophilus influenzae NOT DETECTED NOT DETECTED Final   Neisseria meningitidis NOT DETECTED NOT DETECTED Final   Pseudomonas aeruginosa NOT DETECTED NOT DETECTED Final   Candida albicans NOT DETECTED NOT DETECTED Final   Candida glabrata NOT DETECTED NOT DETECTED Final   Candida krusei NOT DETECTED NOT DETECTED Final   Candida parapsilosis NOT DETECTED NOT DETECTED Final   Candida tropicalis NOT DETECTED NOT DETECTED Final    Comment: Performed at [facility], [place] [address], [place], [place] [address]  Urine Culture     Status: Abnormal (Preliminary result)   Collection Time: 11/03/18  9:58 PM  Result Value Ref Range Status   Specimen Description URINE, RANDOM  Final   Special Requests   Final    NONE Performed at [facility], [place] [address] [place], [place] [address]    Culture >=100,000 COLONIES/mL GRAM NEGATIVE RODS (A)  Final   Report Status PENDING  Incomplete  Blood culture (routine x 2)     Status: None (Preliminary result)   Collection Time: 11/04/18  6:00 PM  Result Value Ref Range Status   Specimen Description BLOOD LEFT FOREARM  Final     Special Requests   Final    BOTTLES DRAWN AEROBIC AND ANAEROBIC Blood Culture results may not be optimal due to an inadequate volume of blood received in culture bottles   Culture   Final  NO GROWTH < 12 HOURS Performed at [facility] [address], [place], [place] [address]    Report Status PENDING  Incomplete  Blood culture (routine x 2)     Status: None (Preliminary result)   Collection Time: 11/04/18  6:17 PM  Result Value Ref Range Status   Specimen Description BLOOD RIGHT ANTECUBITAL  Final   Special Requests   Final    BOTTLES DRAWN AEROBIC AND ANAEROBIC Blood Culture adequate volume   Culture   Final    NO GROWTH < 12 HOURS Performed at [facility], [place] [address] [place], [place] [address]    Report Status PENDING  Incomplete    FURTHER DISCHARGE INSTRUCTIONS:  Get Medicines reviewed and adjusted: Please take all your medications with you for your next visit with your Primary MD  Laboratory/radiological data: Please request your Primary MD to go over all hospital tests and procedure/radiological results at the follow up, please ask your Primary MD to get all Hospital records sent to his/her office.  In some cases, they will be blood work, cultures and biopsy results pending at the time of your discharge. Please request that your primary care M.D. goes through all the records of your hospital data and follows up on these results.  Also Note the following: If you experience worsening of your admission symptoms, develop shortness of breath, life threatening emergency, suicidal or homicidal thoughts you must seek medical attention immediately by calling 911 or calling your MD immediately  if symptoms less severe.  You must read complete instructions/literature along with all the possible adverse reactions/side effects for all the Medicines you take and that have been prescribed to you. Take any new Medicines after you have completely understood and accpet  all the possible adverse reactions/side effects.   Do not drive when taking Pain medications or sleeping medications (Benzodaizepines)  Do not take more than prescribed Pain, Sleep and Anxiety Medications. It is not advisable to combine anxiety,sleep and pain medications without talking with your primary care practitioner  Special Instructions: If you have smoked or chewed Tobacco  in the last 2 yrs please stop smoking, stop any regular Alcohol  and or any Recreational drug use.  Wear Seat belts while driving.  Please note: You were cared for by a hospitalist during your hospital stay. Once you are discharged, your primary care physician will handle any further medical issues. Please note that NO REFILLS for any discharge medications will be authorized once you are discharged, as it is imperative that you return to your primary care physician (or establish a relationship with a primary care physician if you do not have one) for your post hospital discharge needs so that they can reassess your need for medications and monitor your lab values.  Total Time spent coordinating discharge including counseling, education and face to face time equals 35 minutes.  SignedOren Binet 11/05/2018 9:38 AM

## 2018-11-05 NOTE — Care Management CC44 (Signed)
Condition Code 44 Documentation Completed  Patient Details  Name: Tanya Smith MRN: [id number] Date of Birth: 05-17-1930   Condition Code 44 given:  Yes Patient signature on Condition Code 44 notice:  Yes Documentation of 2 MD's agreement:  Yes Code 44 added to claim:  Yes    Sharin Mons, RN 11/05/2018, 10:25 AM

## 2018-11-05 NOTE — Care Management Obs Status (Signed)
[facility] NOTIFICATION   Patient Details  Name: Tanya Smith MRN: [id number] Date of Birth: Sep 28, 1930   Medicare Observation Status Notification Given:  Yes    Sharin Mons, RN 11/05/2018, 10:25 AM

## 2018-11-05 NOTE — Progress Notes (Signed)
PHARMACY NOTE:  ANTIMICROBIAL RENAL DOSAGE ADJUSTMENT  Current antimicrobial regimen includes a mismatch between antimicrobial dosage and estimated renal function.  As per policy approved by the Pharmacy & Therapeutics and Medical Executive Committees, the antimicrobial dosage will be adjusted accordingly.  Current antimicrobial dosage:  Ceftriaxone 1 gm every 24 hours  Indication:Pyelonephritiis complicated by E. Coli Bacteremia  Renal Function:  Estimated Creatinine Clearance: 20.9 mL/min (A) (by C-G formula based on SCr of 1.79 mg/dL (H)). []      On intermittent HD, scheduled: []      On CRRT    Antimicrobial dosage has been changed to: Ceftriaxone 2 gm every 24 hours  Additional comments: Increased dose to appropriately cover bloodstream infection  Thank you for allowing pharmacy to be a part of this patient's care.  Jimmy Footman, PharmD, BCPS, BCIDP Infectious Diseases Clinical Pharmacist Phone: [phone] 11/05/2018 10:07 AM

## 2018-11-05 NOTE — Progress Notes (Signed)
Patient received from ED via bed. Patient is alert and oriented self. Vital signs are stable. Iv in place and running fluid. Skin assessment done with another nurse. Bed in low position and side rail up x3. Call bell in reach.

## 2018-11-06 LAB — URINE CULTURE: Culture: >=100000 — AB

## 2018-11-06 LAB — CULTURE, BLOOD (ROUTINE X 2)

## 2018-11-07 ENCOUNTER — Telehealth: Payer: Self-pay | Admitting: Emergency Medicine

## 2018-11-07 NOTE — Telephone Encounter (Signed)
Post ED Visit - Positive Culture Follow-up  Culture report reviewed by antimicrobial stewardship pharmacist:  []  Elenor Quinones, Pharm.D. []  Heide Guile, Pharm.D., BCPS AQ-ID []  Parks Neptune, Pharm.D., BCPS []  Alycia Rossetti, Pharm.D., BCPS []  [place], [place].D., BCPS, AAHIVP []  Legrand Como, Pharm.D., BCPS, AAHIVP [x]  Salome Arnt, PharmD, BCPS []  Johnnette Gourd, PharmD, BCPS []  Hughes Better, PharmD, BCPS []  Leeroy Cha, PharmD  Positive blood culture Admitted then discharged on Cefedinir, organism sensitive to the same and no further patient follow-up is required at this time.  Georgina Peer RN 11/07/2018, 10:35 AM

## 2018-11-09 LAB — CULTURE, BLOOD (ROUTINE X 2)
Culture: NO GROWTH
Culture: NO GROWTH
Special Requests: ADEQUATE

## 2019-03-01 DIAGNOSIS — N183 Chronic kidney disease, stage 3 (moderate): Secondary | ICD-10-CM | POA: Diagnosis not present

## 2019-03-01 DIAGNOSIS — M109 Gout, unspecified: Secondary | ICD-10-CM | POA: Diagnosis not present

## 2019-03-01 DIAGNOSIS — Z1389 Encounter for screening for other disorder: Secondary | ICD-10-CM | POA: Diagnosis not present

## 2019-03-01 DIAGNOSIS — F3342 Major depressive disorder, recurrent, in full remission: Secondary | ICD-10-CM | POA: Diagnosis not present

## 2019-03-01 DIAGNOSIS — E119 Type 2 diabetes mellitus without complications: Secondary | ICD-10-CM | POA: Diagnosis not present

## 2019-03-01 DIAGNOSIS — Z0000 Encounter for general adult medical examination without abnormal findings: Secondary | ICD-10-CM | POA: Diagnosis not present

## 2019-03-01 DIAGNOSIS — G40909 Epilepsy, unspecified, not intractable, without status epilepticus: Secondary | ICD-10-CM | POA: Diagnosis not present

## 2019-03-01 DIAGNOSIS — I5030 Unspecified diastolic (congestive) heart failure: Secondary | ICD-10-CM | POA: Diagnosis not present

## 2019-03-01 DIAGNOSIS — F0390 Unspecified dementia without behavioral disturbance: Secondary | ICD-10-CM | POA: Diagnosis not present

## 2019-04-27 ENCOUNTER — Emergency Department (HOSPITAL_COMMUNITY): Payer: Medicare Other

## 2019-04-27 ENCOUNTER — Other Ambulatory Visit: Payer: Self-pay

## 2019-04-27 ENCOUNTER — Inpatient Hospital Stay (HOSPITAL_COMMUNITY)
Admission: EM | Admit: 2019-04-27 | Discharge: 2019-04-30 | DRG: 683 | Disposition: A | Discharge status: Hospice | Payer: Medicare Other | Attending: Family Medicine | Admitting: Family Medicine

## 2019-04-27 DIAGNOSIS — R627 Adult failure to thrive: Secondary | ICD-10-CM

## 2019-04-27 DIAGNOSIS — N390 Urinary tract infection, site not specified: Secondary | ICD-10-CM

## 2019-04-27 DIAGNOSIS — F0390 Unspecified dementia without behavioral disturbance: Secondary | ICD-10-CM | POA: Diagnosis present

## 2019-04-27 DIAGNOSIS — M109 Gout, unspecified: Secondary | ICD-10-CM | POA: Diagnosis present

## 2019-04-27 DIAGNOSIS — Z888 Allergy status to other drugs, medicaments and biological substances status: Secondary | ICD-10-CM

## 2019-04-27 DIAGNOSIS — K5900 Constipation, unspecified: Secondary | ICD-10-CM | POA: Diagnosis present

## 2019-04-27 DIAGNOSIS — H9190 Unspecified hearing loss, unspecified ear: Secondary | ICD-10-CM | POA: Diagnosis present

## 2019-04-27 DIAGNOSIS — G8929 Other chronic pain: Secondary | ICD-10-CM | POA: Diagnosis present

## 2019-04-27 DIAGNOSIS — I10 Essential (primary) hypertension: Secondary | ICD-10-CM | POA: Diagnosis present

## 2019-04-27 DIAGNOSIS — R531 Weakness: Secondary | ICD-10-CM

## 2019-04-27 DIAGNOSIS — Z87891 Personal history of nicotine dependence: Secondary | ICD-10-CM

## 2019-04-27 DIAGNOSIS — G309 Alzheimer's disease, unspecified: Secondary | ICD-10-CM | POA: Diagnosis present

## 2019-04-27 DIAGNOSIS — R4020 Unspecified coma: Secondary | ICD-10-CM | POA: Diagnosis not present

## 2019-04-27 DIAGNOSIS — Z6823 Body mass index (BMI) 23.0-23.9, adult: Secondary | ICD-10-CM

## 2019-04-27 DIAGNOSIS — R404 Transient alteration of awareness: Secondary | ICD-10-CM | POA: Diagnosis not present

## 2019-04-27 DIAGNOSIS — M549 Dorsalgia, unspecified: Secondary | ICD-10-CM | POA: Diagnosis present

## 2019-04-27 DIAGNOSIS — N170 Acute kidney failure with tubular necrosis: Principal | ICD-10-CM | POA: Diagnosis present

## 2019-04-27 DIAGNOSIS — R64 Cachexia: Secondary | ICD-10-CM | POA: Diagnosis present

## 2019-04-27 DIAGNOSIS — Z841 Family history of disorders of kidney and ureter: Secondary | ICD-10-CM

## 2019-04-27 DIAGNOSIS — N184 Chronic kidney disease, stage 4 (severe): Secondary | ICD-10-CM | POA: Diagnosis present

## 2019-04-27 DIAGNOSIS — Z20828 Contact with and (suspected) exposure to other viral communicable diseases: Secondary | ICD-10-CM | POA: Diagnosis not present

## 2019-04-27 DIAGNOSIS — N179 Acute kidney failure, unspecified: Secondary | ICD-10-CM | POA: Diagnosis not present

## 2019-04-27 DIAGNOSIS — E1165 Type 2 diabetes mellitus with hyperglycemia: Secondary | ICD-10-CM | POA: Diagnosis not present

## 2019-04-27 DIAGNOSIS — I129 Hypertensive chronic kidney disease with stage 1 through stage 4 chronic kidney disease, or unspecified chronic kidney disease: Secondary | ICD-10-CM | POA: Diagnosis present

## 2019-04-27 DIAGNOSIS — B9620 Unspecified Escherichia coli [E. coli] as the cause of diseases classified elsewhere: Secondary | ICD-10-CM | POA: Diagnosis present

## 2019-04-27 DIAGNOSIS — E860 Dehydration: Secondary | ICD-10-CM | POA: Diagnosis present

## 2019-04-27 DIAGNOSIS — Z1159 Encounter for screening for other viral diseases: Secondary | ICD-10-CM | POA: Diagnosis not present

## 2019-04-27 DIAGNOSIS — F0280 Dementia in other diseases classified elsewhere without behavioral disturbance: Secondary | ICD-10-CM | POA: Diagnosis present

## 2019-04-27 DIAGNOSIS — H409 Unspecified glaucoma: Secondary | ICD-10-CM | POA: Diagnosis present

## 2019-04-27 DIAGNOSIS — F419 Anxiety disorder, unspecified: Secondary | ICD-10-CM | POA: Diagnosis present

## 2019-04-27 DIAGNOSIS — D631 Anemia in chronic kidney disease: Secondary | ICD-10-CM | POA: Diagnosis present

## 2019-04-27 DIAGNOSIS — R0902 Hypoxemia: Secondary | ICD-10-CM | POA: Diagnosis not present

## 2019-04-27 DIAGNOSIS — Z91040 Latex allergy status: Secondary | ICD-10-CM

## 2019-04-27 DIAGNOSIS — R300 Dysuria: Secondary | ICD-10-CM | POA: Diagnosis not present

## 2019-04-27 DIAGNOSIS — Z66 Do not resuscitate: Secondary | ICD-10-CM | POA: Diagnosis present

## 2019-04-27 DIAGNOSIS — E1122 Type 2 diabetes mellitus with diabetic chronic kidney disease: Secondary | ICD-10-CM | POA: Diagnosis present

## 2019-04-27 DIAGNOSIS — F329 Major depressive disorder, single episode, unspecified: Secondary | ICD-10-CM | POA: Diagnosis present

## 2019-04-27 DIAGNOSIS — Z79899 Other long term (current) drug therapy: Secondary | ICD-10-CM

## 2019-04-27 DIAGNOSIS — E7800 Pure hypercholesterolemia, unspecified: Secondary | ICD-10-CM | POA: Diagnosis present

## 2019-04-27 LAB — URINALYSIS, ROUTINE W REFLEX MICROSCOPIC
Bilirubin Urine: NEGATIVE
Glucose, UA: NEGATIVE mg/dL
Ketones, ur: NEGATIVE mg/dL
Nitrite: POSITIVE — AB
Protein, ur: 100 mg/dL — AB
Specific Gravity, Urine: 1.015 (ref 1.005–1.030)
pH: 5 (ref 5.0–8.0)

## 2019-04-27 LAB — BASIC METABOLIC PANEL
Anion gap: 9 (ref 5–15)
BUN: 43 mg/dL — ABNORMAL HIGH (ref 8–23)
CO2: 23 mmol/L (ref 22–32)
Calcium: 9 mg/dL (ref 8.9–10.3)
Chloride: 113 mmol/L — ABNORMAL HIGH (ref 98–111)
Creatinine, Ser: 2.56 mg/dL — ABNORMAL HIGH (ref 0.44–1.00)
GFR calc Af Amer: 19 mL/min — ABNORMAL LOW (ref >60)
GFR calc non Af Amer: 16 mL/min — ABNORMAL LOW (ref >60)
Glucose, Bld: 131 mg/dL — ABNORMAL HIGH (ref 70–99)
Potassium: 3.5 mmol/L (ref 3.5–5.1)
Sodium: 145 mmol/L (ref 135–145)

## 2019-04-27 LAB — CBC WITH DIFFERENTIAL/PLATELET
Abs Immature Granulocytes: 0.03 10*3/uL (ref 0.00–0.07)
Basophils Absolute: 0 10*3/uL (ref 0.0–0.1)
Basophils Relative: 1 %
Eosinophils Absolute: 0.1 10*3/uL (ref 0.0–0.5)
Eosinophils Relative: 1 %
HCT: 39 % (ref 36.0–46.0)
Hemoglobin: 11.7 g/dL — ABNORMAL LOW (ref 12.0–15.0)
Immature Granulocytes: 1 %
Lymphocytes Relative: 23 %
Lymphs Abs: 1.4 10*3/uL (ref 0.7–4.0)
MCH: 28 pg (ref 26.0–34.0)
MCHC: 30 g/dL (ref 30.0–36.0)
MCV: 93.3 fL (ref 80.0–100.0)
Monocytes Absolute: 0.9 10*3/uL (ref 0.1–1.0)
Monocytes Relative: 14 %
Neutro Abs: 3.8 10*3/uL (ref 1.7–7.7)
Neutrophils Relative %: 60 %
Platelets: 190 10*3/uL (ref 150–400)
RBC: 4.18 MIL/uL (ref 3.87–5.11)
RDW: 14.3 % (ref 11.5–15.5)
WBC: 6.2 10*3/uL (ref 4.0–10.5)
nRBC: 0 % (ref 0.0–0.2)

## 2019-04-27 LAB — LACTIC ACID, PLASMA: Lactic Acid, Venous: 1.3 mmol/L (ref 0.5–1.9)

## 2019-04-27 LAB — SARS CORONAVIRUS 2 BY RT PCR (HOSPITAL ORDER, PERFORMED IN ~~LOC~~ HOSPITAL LAB): SARS Coronavirus 2: NEGATIVE

## 2019-04-27 MED ORDER — SODIUM CHLORIDE 0.9 % IV BOLUS
500.0000 mL | Freq: Once | INTRAVENOUS | Status: AC
  Administered 2019-04-27: 500 mL via INTRAVENOUS

## 2019-04-27 MED ORDER — SODIUM CHLORIDE 0.9 % IV SOLN
1.0000 g | Freq: Once | INTRAVENOUS | Status: AC
  Administered 2019-04-27: 22:00:00 1 g via INTRAVENOUS
  Filled 2019-04-27: qty 10

## 2019-04-27 MED ORDER — SODIUM CHLORIDE 0.9 % IV BOLUS: 500.0000 mL | Freq: Once | INTRAVENOUS | Status: DC

## 2019-04-27 MED ORDER — HYDRALAZINE HCL 20 MG/ML IJ SOLN
5.0000 mg | INTRAMUSCULAR | Status: DC | PRN
  Administered 2019-04-27 – 2019-04-28 (×3): 5 mg via INTRAVENOUS
  Filled 2019-04-27 (×3): qty 1

## 2019-04-27 NOTE — ED Notes (Signed)
Patient transported to CT 

## 2019-04-27 NOTE — ED Notes (Signed)
Bed: WA07 Expected date:  Expected time:  Means of arrival:  Comments: [place] now
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
## 2019-04-27 NOTE — ED Triage Notes (Signed)
Per EMS: Pt is a non-verbal dementia patient who has a hx of UTI's. Pt has reportedly been "weaker than normal and not as ambulatory" Pt had a tele-visit with her PCP and they started her on antibiotics for suspected UTI.

## 2019-04-28 ENCOUNTER — Encounter (HOSPITAL_COMMUNITY): Payer: Self-pay | Admitting: Internal Medicine

## 2019-04-28 DIAGNOSIS — M109 Gout, unspecified: Secondary | ICD-10-CM | POA: Diagnosis present

## 2019-04-28 DIAGNOSIS — Z1159 Encounter for screening for other viral diseases: Secondary | ICD-10-CM | POA: Diagnosis not present

## 2019-04-28 DIAGNOSIS — Z888 Allergy status to other drugs, medicaments and biological substances status: Secondary | ICD-10-CM | POA: Diagnosis not present

## 2019-04-28 DIAGNOSIS — F329 Major depressive disorder, single episode, unspecified: Secondary | ICD-10-CM | POA: Diagnosis present

## 2019-04-28 DIAGNOSIS — R531 Weakness: Secondary | ICD-10-CM

## 2019-04-28 DIAGNOSIS — K5900 Constipation, unspecified: Secondary | ICD-10-CM | POA: Diagnosis present

## 2019-04-28 DIAGNOSIS — N390 Urinary tract infection, site not specified: Secondary | ICD-10-CM | POA: Diagnosis present

## 2019-04-28 DIAGNOSIS — F0280 Dementia in other diseases classified elsewhere without behavioral disturbance: Secondary | ICD-10-CM | POA: Diagnosis not present

## 2019-04-28 DIAGNOSIS — I10 Essential (primary) hypertension: Secondary | ICD-10-CM

## 2019-04-28 DIAGNOSIS — H9190 Unspecified hearing loss, unspecified ear: Secondary | ICD-10-CM | POA: Diagnosis present

## 2019-04-28 DIAGNOSIS — N179 Acute kidney failure, unspecified: Secondary | ICD-10-CM

## 2019-04-28 DIAGNOSIS — R627 Adult failure to thrive: Secondary | ICD-10-CM

## 2019-04-28 DIAGNOSIS — E7800 Pure hypercholesterolemia, unspecified: Secondary | ICD-10-CM | POA: Diagnosis present

## 2019-04-28 DIAGNOSIS — G309 Alzheimer's disease, unspecified: Secondary | ICD-10-CM | POA: Diagnosis present

## 2019-04-28 DIAGNOSIS — M549 Dorsalgia, unspecified: Secondary | ICD-10-CM | POA: Diagnosis present

## 2019-04-28 DIAGNOSIS — Z91040 Latex allergy status: Secondary | ICD-10-CM | POA: Diagnosis not present

## 2019-04-28 DIAGNOSIS — I129 Hypertensive chronic kidney disease with stage 1 through stage 4 chronic kidney disease, or unspecified chronic kidney disease: Secondary | ICD-10-CM | POA: Diagnosis present

## 2019-04-28 DIAGNOSIS — Z841 Family history of disorders of kidney and ureter: Secondary | ICD-10-CM | POA: Diagnosis not present

## 2019-04-28 DIAGNOSIS — R64 Cachexia: Secondary | ICD-10-CM | POA: Diagnosis present

## 2019-04-28 DIAGNOSIS — G8929 Other chronic pain: Secondary | ICD-10-CM | POA: Diagnosis present

## 2019-04-28 DIAGNOSIS — Z6823 Body mass index (BMI) 23.0-23.9, adult: Secondary | ICD-10-CM | POA: Diagnosis not present

## 2019-04-28 DIAGNOSIS — H409 Unspecified glaucoma: Secondary | ICD-10-CM | POA: Diagnosis present

## 2019-04-28 DIAGNOSIS — G308 Other Alzheimer's disease: Secondary | ICD-10-CM | POA: Diagnosis not present

## 2019-04-28 DIAGNOSIS — N170 Acute kidney failure with tubular necrosis: Secondary | ICD-10-CM | POA: Diagnosis present

## 2019-04-28 DIAGNOSIS — N184 Chronic kidney disease, stage 4 (severe): Secondary | ICD-10-CM | POA: Diagnosis present

## 2019-04-28 DIAGNOSIS — B9620 Unspecified Escherichia coli [E. coli] as the cause of diseases classified elsewhere: Secondary | ICD-10-CM | POA: Diagnosis present

## 2019-04-28 DIAGNOSIS — Z87891 Personal history of nicotine dependence: Secondary | ICD-10-CM | POA: Diagnosis not present

## 2019-04-28 DIAGNOSIS — F419 Anxiety disorder, unspecified: Secondary | ICD-10-CM | POA: Diagnosis present

## 2019-04-28 DIAGNOSIS — E1122 Type 2 diabetes mellitus with diabetic chronic kidney disease: Secondary | ICD-10-CM | POA: Diagnosis present

## 2019-04-28 LAB — GLUCOSE, CAPILLARY
Glucose-Capillary: 79 mg/dL (ref 70–99)
Glucose-Capillary: 81 mg/dL (ref 70–99)
Glucose-Capillary: 85 mg/dL (ref 70–99)

## 2019-04-28 LAB — CBC WITH DIFFERENTIAL/PLATELET
Abs Immature Granulocytes: 0.03 K/uL (ref 0.00–0.07)
Basophils Absolute: 0 K/uL (ref 0.0–0.1)
Basophils Relative: 0 %
Eosinophils Absolute: 0.1 K/uL (ref 0.0–0.5)
Eosinophils Relative: 1 %
HCT: 37.3 % (ref 36.0–46.0)
Hemoglobin: 11.3 g/dL — ABNORMAL LOW (ref 12.0–15.0)
Immature Granulocytes: 0 %
Lymphocytes Relative: 32 %
Lymphs Abs: 2.1 K/uL (ref 0.7–4.0)
MCH: 28 pg (ref 26.0–34.0)
MCHC: 30.3 g/dL (ref 30.0–36.0)
MCV: 92.6 fL (ref 80.0–100.0)
Monocytes Absolute: 0.8 K/uL (ref 0.1–1.0)
Monocytes Relative: 11 %
Neutro Abs: 3.7 K/uL (ref 1.7–7.7)
Neutrophils Relative %: 56 %
Platelets: 189 K/uL (ref 150–400)
RBC: 4.03 MIL/uL (ref 3.87–5.11)
RDW: 14.1 % (ref 11.5–15.5)
WBC: 6.8 K/uL (ref 4.0–10.5)
nRBC: 0 % (ref 0.0–0.2)

## 2019-04-28 LAB — BASIC METABOLIC PANEL WITH GFR
Anion gap: 9 (ref 5–15)
BUN: 37 mg/dL — ABNORMAL HIGH (ref 8–23)
CO2: 21 mmol/L — ABNORMAL LOW (ref 22–32)
Calcium: 8.6 mg/dL — ABNORMAL LOW (ref 8.9–10.3)
Chloride: 115 mmol/L — ABNORMAL HIGH (ref 98–111)
Creatinine, Ser: 2.17 mg/dL — ABNORMAL HIGH (ref 0.44–1.00)
GFR calc Af Amer: 23 mL/min — ABNORMAL LOW
GFR calc non Af Amer: 20 mL/min — ABNORMAL LOW
Glucose, Bld: 97 mg/dL (ref 70–99)
Potassium: 3.6 mmol/L (ref 3.5–5.1)
Sodium: 145 mmol/L (ref 135–145)

## 2019-04-28 LAB — HEPATIC FUNCTION PANEL
ALT: 16 U/L (ref 0–44)
AST: 24 U/L (ref 15–41)
Albumin: 3 g/dL — ABNORMAL LOW (ref 3.5–5.0)
Alkaline Phosphatase: 82 U/L (ref 38–126)
Bilirubin, Direct: 0.1 mg/dL (ref 0.0–0.2)
Indirect Bilirubin: 0 mg/dL — ABNORMAL LOW (ref 0.3–0.9)
Total Bilirubin: 0.1 mg/dL — ABNORMAL LOW (ref 0.3–1.2)
Total Protein: 6.8 g/dL (ref 6.5–8.1)

## 2019-04-28 LAB — MAGNESIUM: Magnesium: 2 mg/dL (ref 1.7–2.4)

## 2019-04-28 LAB — TSH: TSH: 2.597 u[IU]/mL (ref 0.350–4.500)

## 2019-04-28 MED ORDER — ONDANSETRON HCL 4 MG PO TABS: 4.0000 mg | ORAL_TABLET | Freq: Four times a day (QID) | ORAL | Status: DC | PRN

## 2019-04-28 MED ORDER — ACETAMINOPHEN 325 MG PO TABS
650.0000 mg | ORAL_TABLET | Freq: Four times a day (QID) | ORAL | Status: DC | PRN
  Administered 2019-04-29 – 2019-04-30 (×3): 650 mg via ORAL
  Filled 2019-04-28 (×3): qty 2

## 2019-04-28 MED ORDER — ONDANSETRON HCL 4 MG/2ML IJ SOLN: 4.0000 mg | Freq: Four times a day (QID) | INTRAMUSCULAR | Status: DC | PRN

## 2019-04-28 MED ORDER — ACETAMINOPHEN 650 MG RE SUPP: 650.0000 mg | Freq: Four times a day (QID) | RECTAL | Status: DC | PRN

## 2019-04-28 MED ORDER — LACTATED RINGERS IV SOLN
INTRAVENOUS | Status: AC
  Administered 2019-04-28 (×2): via INTRAVENOUS

## 2019-04-28 MED ORDER — LATANOPROST 0.005 % OP SOLN
1.0000 [drp] | Freq: Every day | OPHTHALMIC | Status: DC
  Administered 2019-04-28 – 2019-04-29 (×2): 1 [drp] via OPHTHALMIC
  Filled 2019-04-28: qty 2.5

## 2019-04-28 MED ORDER — HEPARIN SODIUM (PORCINE) 5000 UNIT/ML IJ SOLN
5000.0000 [IU] | Freq: Three times a day (TID) | INTRAMUSCULAR | Status: DC
  Administered 2019-04-28 – 2019-04-30 (×7): 5000 [IU] via SUBCUTANEOUS
  Filled 2019-04-28 (×7): qty 1

## 2019-04-28 MED ORDER — SODIUM CHLORIDE 0.9 % IV SOLN
1.0000 g | INTRAVENOUS | Status: DC
  Administered 2019-04-28 – 2019-04-29 (×2): 1 g via INTRAVENOUS
  Filled 2019-04-28 (×2): qty 1

## 2019-04-28 MED ORDER — AMLODIPINE BESYLATE 10 MG PO TABS
10.0000 mg | ORAL_TABLET | Freq: Every day | ORAL | Status: DC
  Administered 2019-04-28 – 2019-04-30 (×3): 10 mg via ORAL
  Filled 2019-04-28 (×3): qty 1

## 2019-04-28 MED ORDER — AMLODIPINE BESYLATE 5 MG PO TABS: 5.0000 mg | ORAL_TABLET | Freq: Every day | ORAL | Status: DC

## 2019-04-28 MED ORDER — BRIMONIDINE TARTRATE 0.15 % OP SOLN
1.0000 [drp] | Freq: Every day | OPHTHALMIC | Status: DC
  Administered 2019-04-28 – 2019-04-30 (×3): 1 [drp] via OPHTHALMIC
  Filled 2019-04-28: qty 5

## 2019-04-28 NOTE — Plan of Care (Signed)
  Problem: Education: Goal: Knowledge of General Education information will improve Description: Including pain rating scale, medication(s)/side effects and non-pharmacologic comfort measures Outcome: Progressing   Problem: Clinical Measurements: Goal: Ability to maintain clinical measurements within normal limits will improve Outcome: Progressing   Problem: Coping: Goal: Level of anxiety will decrease Outcome: Progressing   Problem: Elimination: Goal: Will not experience complications related to urinary retention Outcome: Progressing   Problem: Safety: Goal: Ability to remain free from injury will improve Outcome: Progressing

## 2019-04-28 NOTE — Evaluation (Addendum)
Physical Therapy Evaluation Patient Details Name: Tanya Smith MRN: [id number] DOB: 09-Mar-1930 Today's Date: 04/28/2019   History of Present Illness  85 y.o. female with with history of advanced dementia nonverbal and previous history of diabetes mellitus hypertension  was brought to the ER by patient's daughter after patient was found to be increasingly weak poor appetite and more confused  Clinical Impression  Pt admitted with above diagnosis. Pt currently with functional limitations due to the deficits listed below (see PT Problem List).  Pt mobility limited by cognition, per chart/RN pt is a household amb and plan is for return home with continued family assist. PT in agreement with this plan, will follow in acute setting.   Pt will benefit from skilled PT to increase their independence and safety with mobility to allow discharge to the venue listed below.       Follow Up Recommendations No PT follow up; 24hr assist/supervision    Equipment Recommendations  None recommended by PT    Recommendations for Other Services       Precautions / Restrictions Precautions Precautions: Fall Restrictions Weight Bearing Restrictions: No      Mobility  Bed Mobility Overal bed mobility: Needs Assistance Bed Mobility: Supine to Sit;Sit to Supine     Supine to sit: Mod assist Sit to supine: Mod assist   General bed mobility comments: incr time, assist for trunk and LEs, multi-modal cues needed  Transfers Overall transfer level: Needs assistance Equipment used: 1 person hand held assist Transfers: Sit to/from Stand Sit to Stand: Mod assist         General transfer comment: pt able to stand x3 with multi-modal cues, follows visual cues most consistently; requiring less assist with each trial  Ambulation/Gait                Stairs            Wheelchair Mobility    Modified Rankin (Stroke Patients Only)       Balance Overall balance assessment: Needs  assistance Sitting-balance support: Feet supported;No upper extremity supported Sitting balance-Leahy Scale: Fair     Standing balance support: Single extremity supported Standing balance-Leahy Scale: Poor Standing balance comment: reliant on UEs and external assist                             Pertinent Vitals/Pain Pain Assessment: Faces Pain Score: 0-No pain Faces Pain Scale: No hurt    Home Living Family/patient expects to be discharged to:: Private residence Living Arrangements: Children;Other relatives               Additional Comments: pt unable to provide info, per chart and RN pt has extensive home support    Prior Function Level of Independence: Needs assistance   Gait / Transfers Assistance Needed: household amb at baseline           Hand Dominance        Extremity/Trunk Assessment   Upper Extremity Assessment Upper Extremity Assessment: Generalized weakness;Defer to OT evaluation    Lower Extremity Assessment Lower Extremity Assessment: Generalized weakness       Communication   Communication: Expressive difficulties(nonverbal at baseline)  Cognition Arousal/Alertness: Awake/alert Behavior During Therapy: Restless Overall Cognitive Status: History of cognitive impairments - at baseline  General Comments: unable to follow commands at baseline, with multi-modal cues intermittently able to follow basic functional commands      General Comments      Exercises     Assessment/Plan  2x/wk    PT Assessment    PT Problem List  decr cognition, decr balance, decr activity tol       PT Treatment Interventions   gait/balance/therapeutic exercise   PT Goals (Current goals can be found in the Care Plan section)  Acute Rehab PT Goals Patient Stated Goal: did not state PT Goal Formulation: Patient unable to participate in goal setting Potential to Achieve Goals: Fair    Frequency      Barriers to discharge        Co-evaluation               AM-PAC PT "6 Clicks" Mobility  Outcome Measure Help needed turning from your back to your side while in a flat bed without using bedrails?: A Lot Help needed moving from lying on your back to sitting on the side of a flat bed without using bedrails?: A Lot Help needed moving to and from a bed to a chair (including a wheelchair)?: A Lot Help needed standing up from a chair using your arms (e.g., wheelchair or bedside chair)?: A Lot Help needed to walk in hospital room?: Total Help needed climbing 3-5 steps with a railing? : Total 6 Click Score: 10    End of Session   Activity Tolerance: Other (comment)(limited by cognition) Patient left: in bed Nurse Communication: Other (comment)(family expectations for disposition) PT Visit Diagnosis: Other abnormalities of gait and mobility (R26.89)    Time: 7829-5621 PT Time Calculation (min) (ACUTE ONLY): 13 min   Charges:   PT Evaluation $PT Eval Low Complexity: 1 Low          Kenyon Ana, PT  Pager: [phone] Acute Rehab Dept [facility]): [phone]   04/28/2019   [facility] 04/28/2019, 3:52 PM
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
## 2019-04-28 NOTE — Consult Note (Signed)
Consultation Note Date: 04/28/2019   Patient Name: Tanya Smith  DOB: [birth year]  MRN: [id number]  Age / Sex: 85 y.o., female  PCP: Lavone Orn, MD Referring Physician: Mercy Riding, MD  Reason for Consultation: Establishing goals of care and Psychosocial/spiritual support  HPI/Patient Profile: 85 y.o. female   admitted on 04/27/2019 with   history of advanced dementia nonverbal and previous history of diabetes mellitus hypertension presently only on Remeron was brought to the ER by patient's daughter after patient was found to be increasingly weak poor appetite and more confused than normal over the last 2 days.  Has been having some diarrhea last 2 days.   Patient has been at home under her daughter's care for the past 2 years.  Prior to that patient had been a long-term residence of several skilled nursing facilities in the area.  Family took the patient home secondary to concerns over facility services. They have [facility] 8 hrs/day nursing services.  Patient has had hospice benefit in the past but was released approximately 9 months ago.  Patient has had continued physical, functional and cognitive decline.  She is sleeping more and more calm, eating less and less.  Family reports weight loss.  Patient is nonverbal and unable to follow commands  Family face ongoing decisions regarding treatment options, advance directives, and anticipatory care needs secondary to end-stage dementia diagnosis.  Clinical Assessment and Goals of Care:   This NP Wadie Lessen reviewed medical records, received report from team, assessed the patient and then meet at the patient's bedside   to discuss diagnosis, prognosis, GOC, EOL wishes disposition and options.  Initially I spoke with granddaughter/Stephanie at the bedside, then I spoke to patient's daughter and main caregiver/ Nelta Numbers to again discussed diagnosis, prognosis,  goals of care, end-of-life wishes, disposition and options. Discussed the natural trajectory and expectation's of the disease of dementia  Concept of Hospice and Palliative Care were discussed.  Again family as well informed regarding the services, and actually the patient has had hospice services in the past.  A detailed discussion was had today regarding advanced directives.  Concepts specific to code status, artifical feeding and hydration, continued IV antibiotics and rehospitalization was had.  The difference between a aggressive medical intervention path  and a palliative comfort care path for this patient at this time was had.  Values and goals of care important to patient and family were attempted to be elicited.  Natural trajectory and expectations at EOL were discussed.  Questions and concerns addressed.   Family encouraged to call with questions or concerns.     LEGAL GUARDIAN    SUMMARY OF RECOMMENDATIONS    Code Status/Advance Care Planning:  DNR   Palliative Prophylaxis:   Aspiration, Bowel Regimen, Delirium Protocol, Frequent Pain Assessment and Oral Care  Additional Recommendations (Limitations, Scope, Preferences):  Avoid Hospitalization, Minimize Medications and No Artificial Feeding   Main focus of care is comfort and dignity.   No artificial feeding now or in the future  Consider  trial of hydration and IV antibiotics if and when need arises  When medically stable discharge home with current care plan in place and augment with hospice benefit.  Psycho-social/Spiritual:   Desire for further Chaplaincy support:no  Additional Recommendations: Education on Hospice  Prognosis:   < 6 months  Discharge Planning: Home with Hospice      Primary Diagnoses: Present on Admission: . ARF (acute renal failure) ([place]) . Hypertension . Dementia ([place])   I have reviewed the medical record, interviewed the patient and family, and examined the patient. The  following aspects are pertinent.  Past Medical History:  Diagnosis Date  . Abdominal muscle defects   . Alzheimer disease ([place])   . Anxiety   . Bladder prolapse   . CHF (congestive heart failure) ([place])   . Chronic back pain   . Complication of anesthesia    ' WOKE UP DURING SHOULDER SURGERY  . Constipation   . Dementia ([place])   . Depression   . Diabetes mellitus   . Glaucoma   . Gout   . Hearing loss   . High cholesterol   . History of hiatal hernia   . Hypertension   . Postnasal drip   . Renal disorder   . Seasonal allergies   . Urinary incontinence   . Vertigo    Social History   Socioeconomic History  . Marital status: Legally Separated    Spouse name: Winferd Humphrey  . Number of children: 6  . Years of education: 11th  . Highest education level: Not on file  Occupational History  . Not on file  Social Needs  . Financial resource strain: Not on file  . Food insecurity    Worry: Not on file    Inability: Not on file  . Transportation needs    Medical: Not on file    Non-medical: Not on file  Tobacco Use  . Smoking status: Former Smoker    Packs/day: 0.50    Types: Cigarettes  . Smokeless tobacco: Never Used  Substance and Sexual Activity  . Alcohol use: No    Comment: occasionally; quit drinking in 1992  . Drug use: No  . Sexual activity: Not on file  Lifestyle  . Physical activity    Days per week: Not on file    Minutes per session: Not on file  . Stress: Not on file  Relationships  . Social Herbalist on phone: Not on file    Gets together: Not on file    Attends religious service: Not on file    Active member of club or organization: Not on file    Attends meetings of clubs or organizations: Not on file    Relationship status: Not on file  Other Topics Concern  . Not on file  Social History Narrative   Pt lives at home with her family.   Caffeine Use: Rarely   Family History  Problem Relation Age of Onset  . Kidney disease Father     Scheduled Meds: . brimonidine  1 drop Both Eyes Daily  . heparin  5,000 Units Subcutaneous Q8H  . latanoprost  1 drop Both Eyes QHS   Continuous Infusions: . cefTRIAXone (ROCEPHIN)  IV 1 g (04/28/19 1055)  . lactated ringers 75 mL/hr at 04/28/19 0559  . sodium chloride     PRN Meds:.acetaminophen **OR** acetaminophen, hydrALAZINE, ondansetron **OR** ondansetron (ZOFRAN) IV Medications Prior to Admission:  Prior to Admission medications   Medication Sig Start Date  End Date Taking? Authorizing Provider  acetaminophen (TYLENOL) 325 MG tablet Take 325-650 mg by mouth every 6 (six) hours as needed for moderate pain.   Yes [provider]  bisacodyl (BISACODYL) 5 MG EC tablet Take 5 mg by mouth daily as needed.    Yes [provider]  brimonidine (ALPHAGAN) 0.15 % ophthalmic solution Place 1 drop into both eyes daily.   Yes [provider]  cephALEXin (KEFLEX) 250 MG capsule Take 250 mg by mouth 2 (two) times daily. 04/27/19 05/01/19 Yes [provider]  fluticasone (FLONASE) 50 MCG/ACT nasal spray Place 1 spray into both nostrils daily as needed for allergies.  09/10/17  Yes [provider]  latanoprost (XALATAN) 0.005 % ophthalmic solution Place 1 drop into both eyes at bedtime.   Yes [provider]  mirtazapine (REMERON) 30 MG tablet Take 30 mg by mouth at bedtime.   Yes [provider]  Nutritional Supplements (NUTRITIONAL DRINK) LIQD Take 1 Container by mouth daily as needed. When she does not eat   Yes [provider]  senna-docusate (SENOKOT-S) 8.6-50 MG tablet Take 1 tablet by mouth at bedtime as needed for mild constipation.    Yes [provider]  zinc oxide (BALMEX) 11.3 % CREA cream Apply 1 application topically daily as needed (rash).   Yes [provider]  cefdinir (OMNICEF) 300 MG capsule Take 1 capsule (300 mg total) by mouth daily. Patient not taking: Reported on 04/27/2019 11/05/18   Jonetta Osgood, MD  fluconazole (DIFLUCAN) 150 MG tablet Take 1 tablet by mouth on first day of antibiotic and then take 1 tablet by mouth on last day of antibiotic Patient not taking: Reported on 08/28/2017 08/18/17   Street, [place], PA-C  hydrochlorothiazide (HYDRODIURIL) 12.5 MG tablet Take 1 tablet (12.5 mg total) by mouth daily. Patient not taking: Reported on 04/27/2019 08/18/17   Street, [place], [place]   Allergies  Allergen Reactions  . Ace Inhibitors Other (See Comments)  . Furosemide Other (See Comments)    Unknown. Per MAR. Can take brand name Lasix.   . Sertraline Other (See Comments)    Unknown; per physician's orders   . Latex Rash  . Other Rash    Soap & deodorant.  . Tape Rash   Review of Systems  Unable to perform ROS: Dementia  All other systems reviewed and are negative.   Physical Exam Constitutional:      General: She is awake.     Appearance: She is cachectic. She is ill-appearing.  Cardiovascular:     Rate and Rhythm: Normal rate and regular rhythm.     Heart sounds: Normal heart sounds.  Musculoskeletal:     Comments: Generalized weakness and muscle atrophy  Skin:    General: Skin is warm and dry.     Vital Signs: BP (!) 137/91 (BP Location: Left Leg)   Pulse 70   Temp 97.9 F (36.6 C) (Oral)   Resp 18   SpO2 100%  Pain Scale: PAINAD       SpO2: SpO2: 100 % O2 Device:SpO2: 100 % O2 Flow Rate: .   IO: Intake/output summary:   Intake/Output Summary (Last 24 hours) at 04/28/2019 1056 Last data filed at 04/28/2019 0900 Gross per 24 hour  Intake 1033.58 ml  Output -  Net 1033.58 ml    LBM: Last BM Date: 04/27/19 Baseline Weight:   Most recent weight:       Palliative Assessment/Data:  30%   Discussed with  Dr Cyndia Skeeters and CMRN  Time In: 7471 Time Out: 1500 Time Total: 75 minutes Greater than 50%  of this time was spent counseling and coordinating care related to the above assessment and plan.  Signed by: Wadie Lessen, NP   Please  contact Palliative Medicine Team phone at [phone] for questions and concerns.  For individual provider: See Shea Evans

## 2019-04-28 NOTE — Evaluation (Signed)
Occupational Therapy Evaluation Patient Details Name: Tanya Smith MRN: [id number] DOB: [birth year] Today's Date: 04/28/2019    History of Present Illness 85 year old female presents with generalized weakness.  History is taken from the granddaughter over the phone.  Has been feeling poorly for about a week.  She has dementia and is nonverbal but normally walks without any assistance.  She has been falling the last few days and unable to get herself up.   Clinical Impression   Pt admitted with weakness. Pt currently with functional limitations due to the deficits listed below (see OT Problem List).  Pt will benefit from skilled OT to increase their safety and independence with ADL and functional mobility for ADL to facilitate discharge to venue listed below.   Per RN pt will have needed A at home with ADL activity. Pt will need significant A     Follow Up Recommendations  Supervision/Assistance - 24 hour    Equipment Recommendations  None recommended by OT    Recommendations for Other Services       Precautions / Restrictions Precautions Precautions: Fall      Mobility Bed Mobility Overal bed mobility: Needs Assistance Bed Mobility: Supine to Sit     Supine to sit: Max assist;HOB elevated        Transfers Overall transfer level: Needs assistance Equipment used: 1 person hand held assist Transfers: Sit to/from Stand Sit to Stand: Max assist         General transfer comment: Pt did well with OT following gestures.  Pt sat EOB and did stand with OT    Balance Overall balance assessment: Needs assistance Sitting-balance support: Feet supported Sitting balance-Leahy Scale: Fair     Standing balance support: Single extremity supported Standing balance-Leahy Scale: Poor                             ADL either performed or assessed with clinical judgement   ADL Overall ADL's : Needs assistance/impaired                                        General ADL Comments: Daugther not present but pt sat EOB with OT and did stand with max A. Per RN pt was walking around at home                  Pertinent Vitals/Pain Pain Assessment: Faces Pain Score: 0-No pain Faces Pain Scale: No hurt     Hand Dominance     Extremity/Trunk Assessment Upper Extremity Assessment Upper Extremity Assessment: Generalized weakness           Communication Communication Communication: Expressive difficulties(pt followed gestures with OT)   Cognition Arousal/Alertness: Awake/alert   Overall Cognitive Status: History of cognitive impairments - at baseline                                                Home Living Family/patient expects to be discharged to:: Unsure Living Arrangements: Children;Other relatives                                      Prior Functioning/Environment Level of  Independence: Needs assistance                 OT Problem List: Decreased strength;Decreased activity tolerance;Impaired balance (sitting and/or standing);Decreased safety awareness      OT Treatment/Interventions: Self-care/ADL training    OT Goals(Current goals can be found in the care plan section) Acute Rehab OT Goals Patient Stated Goal: did not state OT Goal Formulation: With patient Time For Goal Achievement: 05/05/19 Potential to Achieve Goals: Fair  OT Frequency: Min 2X/week    AM-PAC OT "6 Clicks" Daily Activity     Outcome Measure Help from another person eating meals?: Total Help from another person taking care of personal grooming?: A Lot Help from another person toileting, which includes using toliet, bedpan, or urinal?: Total Help from another person bathing (including washing, rinsing, drying)?: A Lot Help from another person to put on and taking off regular upper body clothing?: A Lot Help from another person to put on and taking off regular lower body clothing?: Total 6 Click Score:  9   End of Session Equipment Utilized During Treatment: Gait belt Nurse Communication: Mobility status  Activity Tolerance: Patient limited by fatigue Patient left: in bed;with call bell/phone within reach;with bed alarm set  OT Visit Diagnosis: Unsteadiness on feet (R26.81);Other abnormalities of gait and mobility (R26.89);Muscle weakness (generalized) (M62.81);History of falling (Z91.81)                Time: 1856-3149 OT Time Calculation (min): 14 min Charges:  OT General Charges $OT Visit: 1 Visit OT Treatments $Self Care/Home Management : 8-22 mins  Kari Baars, [place] [phone] Office- [phone], Edwena Felty D 04/28/2019, 3:44 PM
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
## 2019-04-28 NOTE — Progress Notes (Signed)
Admission from earlier this morning. 85 year old female with history of advanced dementia, hypertension, CKD 4 and nonverbal status presenting from home with weakness, p.o. intake and confusion.  Admitted for AKI and possible UTI.  Remains on IV fluids and IV antibiotics pending clinical improvement and cultures.  No major events overnight of this morning.  Patient is sleepy but arises to voice easily.  However, she is not able to provide history.  Does not appear to be in distress.  On exam, no acute distress.  No gross focal neuro deficits.  Heart, lung and abdominal exam benign.  Assessment and plan  AKI/mild azotemia/CKD4: Likely a combination of prerenal and ATN.  -Continue IV fluid -Hold home meds.  Possible UTI: Mild temp to 99.4 which could be considered a fever for her.  Unclear if she has UTI symptoms.  No leukocytosis. -Continue ceftriaxone pending cultures  Advanced dementia: Reportedly has 8 hours of RN service at home -We will add hospice service on discharge per palliative care -Delirium precautions here  -Continue supplements  Hypertension: BP slightly elevated. -Add amlodipine  -discontinue HCTZ-high risk for dehydration due to poor p.o. intake  PT/OT eval-no PT

## 2019-04-29 ENCOUNTER — Other Ambulatory Visit: Payer: Self-pay

## 2019-04-29 LAB — BASIC METABOLIC PANEL
Anion gap: 7 (ref 5–15)
BUN: 31 mg/dL — ABNORMAL HIGH (ref 8–23)
CO2: 23 mmol/L (ref 22–32)
Calcium: 8.5 mg/dL — ABNORMAL LOW (ref 8.9–10.3)
Chloride: 111 mmol/L (ref 98–111)
Creatinine, Ser: 2.03 mg/dL — ABNORMAL HIGH (ref 0.44–1.00)
GFR calc Af Amer: 25 mL/min — ABNORMAL LOW (ref >60)
GFR calc non Af Amer: 22 mL/min — ABNORMAL LOW (ref >60)
Glucose, Bld: 95 mg/dL (ref 70–99)
Potassium: 3.6 mmol/L (ref 3.5–5.1)
Sodium: 141 mmol/L (ref 135–145)

## 2019-04-29 LAB — GLUCOSE, CAPILLARY
Glucose-Capillary: 145 mg/dL — ABNORMAL HIGH (ref 70–99)
Glucose-Capillary: 83 mg/dL (ref 70–99)
Glucose-Capillary: 89 mg/dL (ref 70–99)
Glucose-Capillary: 92 mg/dL (ref 70–99)

## 2019-04-29 LAB — URINE CULTURE: Culture: >=100000 — AB

## 2019-04-29 LAB — MAGNESIUM: Magnesium: 1.8 mg/dL (ref 1.7–2.4)

## 2019-04-29 MED ORDER — CEFAZOLIN SODIUM-DEXTROSE 1-4 GM/50ML-% IV SOLN
1.0000 g | Freq: Two times a day (BID) | INTRAVENOUS | Status: DC
  Administered 2019-04-30: 11:00:00 1 g via INTRAVENOUS
  Filled 2019-04-29: qty 50

## 2019-04-29 MED ORDER — FLUCONAZOLE 150 MG PO TABS
150.0000 mg | ORAL_TABLET | Freq: Once | ORAL | Status: AC
  Administered 2019-04-29: 150 mg via ORAL
  Filled 2019-04-29: qty 1

## 2019-04-29 MED ORDER — LACTATED RINGERS IV SOLN
INTRAVENOUS | Status: AC
  Administered 2019-04-29 (×2): via INTRAVENOUS

## 2019-04-29 MED ORDER — HYDRALAZINE HCL 20 MG/ML IJ SOLN
10.0000 mg | INTRAMUSCULAR | Status: DC | PRN
  Administered 2019-04-29 – 2019-04-30 (×2): 10 mg via INTRAVENOUS
  Filled 2019-04-29 (×2): qty 1

## 2019-04-29 NOTE — Progress Notes (Signed)
PROGRESS NOTE  Tanya Smith TDV:[id number] DOB: September 17, 1930 DOA: 04/27/2019 PCP: Lavone Orn, MD   LOS: 1 day   Patient is from: Home  Brief Narrative / Interim history: 85 year old female with history of advanced dementia, hypertension, CKD 4 and nonverbal status presenting from home with weakness, p.o. intake and confusion.  Admitted for AKI and UTI on IV fluid and IV Rocephin.  Subjective: No major events overnight of this morning.  Granddaughter spend night with the patient.  Patient remains confused and nonverbal.  Does not seem to be in distress. Two granddaughters at bedside. We have discussed about his condition and plan.   Objective: Vitals:   04/28/19 2033 04/28/19 2347 04/29/19 0415 04/29/19 1037  BP: (!) 199/103 (!) 119/100 (!) 138/55 (!) 173/95  Pulse: 64 68 71   Resp: 20 18 18    Temp: 98.4 F (36.9 C)  97.8 F (36.6 C)   TempSrc: Oral  Oral   SpO2: 100% 100% 100%   Weight:   63.9 kg     Intake/Output Summary (Last 24 hours) at 04/29/2019 1201 Last data filed at 04/29/2019 0500 Gross per 24 hour  Intake 1547.84 ml  Output -  Net 1547.84 ml   Filed Weights   04/29/19 0415  Weight: 63.9 kg    Examination:  GENERAL: No acute distress.  Lying in bed comfortably. HEENT: MMM.  Vision and hearing grossly intact.  NECK: Supple.  LUNGS:  No IWOB. Good air movement bilaterally. HEART:  RRR. Heart sounds normal.  ABD: Bowel sounds present. Soft. Non tender.  MSK/EXT:  Moves all extremities. No apparent deformity. No edema bilaterally.  SKIN: no apparent skin lesion or wound NEURO: Awake, alert but disoriented.  No gross neuro deficits. PSYCH: Calm.   I have personally reviewed the following labs and images:  Radiology Studies: No results found.  Microbiology: Recent Results (from the past 240 hour(s))  Urine culture     Status: Abnormal (Preliminary result)   Collection Time: 04/27/19  7:36 PM   Specimen: Urine, Catheterized  Result Value Ref Range Status    Specimen Description   Final    URINE, CATHETERIZED Performed at [facility] [address], [place], [place] [address]    Special Requests   Final    NONE Performed at [facility], [place] [address] [place], [place] [address]    Culture (A)  Final    >=100,000 COLONIES/mL GRAM NEGATIVE RODS IDENTIFICATION AND SUSCEPTIBILITIES TO FOLLOW Performed at [facility], [place] [address], [place], [place] [address]    Report Status PENDING  Incomplete  Culture, blood (routine x 2)     Status: None (Preliminary result)   Collection Time: 04/27/19  8:51 PM   Specimen: BLOOD  Result Value Ref Range Status   Specimen Description   Final    BLOOD LEFT ANTECUBITAL Performed at [facility] [address] [place], [place] [address]    Special Requests   Final    BOTTLES DRAWN AEROBIC ONLY Blood Culture adequate volume Performed at [facility] [address], [place], [place] [address]    Culture  Setup Time   Final    AEROBIC BOTTLE ONLY GRAM POSITIVE RODS CRITICAL RESULT CALLED TO, READ BACK BY AND VERIFIED WITH: Lavell Luster [facility] 04/29/19 0208 JDW Performed at [facility], [place] [address] [place], [place] [address]    Culture GRAM POSITIVE RODS  Final   Report Status PENDING  Incomplete  SARS Coronavirus 2 (CEPHEID - Performed in [facility] lab), Hosp Order     Status: None   Collection Time: 04/27/19 10:29 PM   Specimen: Nasopharyngeal Swab  Result Value Ref Range Status   SARS Coronavirus 2 NEGATIVE NEGATIVE Final    Comment: (NOTE) If result is NEGATIVE SARS-CoV-2 target nucleic acids are NOT DETECTED. The SARS-CoV-2 RNA is generally detectable in upper and lower  respiratory specimens during the acute phase of infection. The lowest  concentration of SARS-CoV-2 viral copies this assay can detect is 250  copies / mL. A negative result does not preclude SARS-CoV-2 infection  and  should not be used as the sole basis for treatment or other  patient management decisions.  A negative result may occur with  improper specimen collection / handling, submission of specimen other  than nasopharyngeal swab, presence of viral mutation(s) within the  areas targeted by this assay, and inadequate number of viral copies  (<250 copies / mL). A negative result must be combined with clinical  observations, patient history, and epidemiological information. If result is POSITIVE SARS-CoV-2 target nucleic acids are DETECTED. The SARS-CoV-2 RNA is generally detectable in upper and lower  respiratory specimens dur ing the acute phase of infection.  Positive  results are indicative of active infection with SARS-CoV-2.  Clinical  correlation with patient history and other diagnostic information is  necessary to determine patient infection status.  Positive results do  not rule out bacterial infection or co-infection with other viruses. If result is PRESUMPTIVE POSTIVE SARS-CoV-2 nucleic acids MAY BE PRESENT.   A presumptive positive result was obtained on the submitted specimen  and confirmed on repeat testing.  While 2019 novel coronavirus  (SARS-CoV-2) nucleic acids may be present in the submitted sample  additional confirmatory testing may be necessary for epidemiological  and / or clinical management purposes  to differentiate between  SARS-CoV-2 and other Sarbecovirus currently known to infect humans.  If clinically indicated additional testing with an alternate test  methodology [phone]) is advised. The SARS-CoV-2 RNA is generally  detectable in upper and lower respiratory sp ecimens during the acute  phase of infection. The expected result is Negative. Fact Sheet for Patients:  StrictlyIdeas.no Fact Sheet for Healthcare Providers: BankingDealers.co.za This test is not yet approved or cleared by the [place] FDA and has been  authorized for detection and/or diagnosis of SARS-CoV-2 by FDA under an Emergency Use Authorization (EUA).  This EUA will remain in effect (meaning this test can be used) for the duration of the COVID-19 declaration under Section 564(b)(1) of the Act, 21 U.S.C. section 360bbb-3(b)(1), unless the authorization is terminated or revoked sooner. Performed at [facility], [place] [address], [place], [place] [address]   Culture, blood (routine x 2)     Status: None (Preliminary result)   Collection Time: 04/28/19  2:14 AM   Specimen: BLOOD  Result Value Ref Range Status   Specimen Description   Final    BLOOD BLOOD RIGHT ARM Performed at [facility] [address] [place], [place] [address]    Special Requests   Final    BOTTLES DRAWN AEROBIC AND ANAEROBIC Blood Culture results may not be optimal due to an inadequate volume of blood received in culture bottles Performed at [facility] [address], [place], [place] [address]    Culture   Final    NO GROWTH 1 DAY Performed at [facility], [address] [place], [place] [address]    Report Status PENDING  Incomplete    Sepsis Labs: Invalid input(s): PROCALCITONIN, LACTICIDVEN  Urine analysis:    Component Value Date/Time   COLORURINE AMBER (A) 04/27/2019 1936   APPEARANCEUR CLOUDY (A) 04/27/2019 1936   LABSPEC 1.015 04/27/2019 1936   PHURINE 5.0 04/27/2019 1936   GLUCOSEU NEGATIVE 04/27/2019 1936   HGBUR SMALL (A) 04/27/2019 1936   BILIRUBINUR NEGATIVE 04/27/2019 1936   KETONESUR NEGATIVE 04/27/2019 1936   PROTEINUR 100 (A) 04/27/2019 1936   UROBILINOGEN 0.2 06/14/2013 0356   NITRITE POSITIVE (A) 04/27/2019 1936   LEUKOCYTESUR TRACE (A) 04/27/2019 1936    Anemia Panel: No results for input(s): VITAMINB12, FOLATE, FERRITIN, TIBC, IRON, RETICCTPCT in the last 72 hours.  Thyroid Function Tests: Recent Labs    04/28/19 0549  TSH 2.597    Lipid  Profile: No results for input(s): CHOL, HDL, LDLCALC, TRIG, CHOLHDL, LDLDIRECT in the last 72 hours.  CBG: Recent Labs  Lab 04/28/19 1143 04/28/19 1733 04/28/19 2338 04/29/19 0024 04/29/19 0412  GLUCAP 81 85 79 145* 89    HbA1C: No results for input(s): HGBA1C in the last 72 hours.  BNP (last 3 results): No results for input(s): PROBNP in the last 8760 hours.  Cardiac Enzymes: No results for input(s): CKTOTAL, CKMB, CKMBINDEX, TROPONINI in the last 168 hours.  Coagulation Profile: No results for input(s): INR, PROTIME in the last 168 hours.  Liver Function Tests: Recent Labs  Lab 04/28/19 0549  AST 24  ALT 16  ALKPHOS 82  BILITOT 0.1*  PROT 6.8  ALBUMIN 3.0*   No results for input(s): LIPASE, AMYLASE in the last 168 hours. No results for input(s): AMMONIA in the last 168 hours.  Basic Metabolic Panel: Recent Labs  Lab 04/27/19 1912 04/28/19 0549 04/29/19 0510  NA 145 145 141  K 3.5 3.6 3.6  CL 113* 115* 111  CO2 23 21* 23  GLUCOSE 131* 97 95  BUN 43* 37* 31*  CREATININE 2.56* 2.17* 2.03*  CALCIUM 9.0 8.6* 8.5*  MG  --  2.0 1.8   GFR: Estimated Creatinine Clearance: 17.6 mL/min (A) (by C-G formula based on SCr of 2.03 mg/dL (H)).  CBC: Recent Labs  Lab 04/27/19 1915 04/28/19 0549  WBC 6.2 6.8  NEUTROABS 3.8 3.7  HGB 11.7* 11.3*  HCT 39.0 37.3  MCV 93.3 92.6  PLT 190 189    Procedures:  None  Microbiology: COVID-19 negative Blood cultures GPR-likely contaminant. Urine culture > 100K GNR  Assessment & Plan: AKI/mild azotemia/CKD-4: Likely combination of prerenal and ATN.  Improving. -Continue IV fluids -Hold home antihypertensive medications in the setting of AKI.  UTI: Presenting with mild temperature to 99.4 which could be considered a fever for her age.  Urinalysis concerning.  Urine culture > 100k GNR -Follow IV ceftriaxone pending culture speciation.  Advanced dementia: Patient is totally disoriented and nonverbal.  Reportedly  has 8 hours of RN service at home per palliative care. -Palliative care recommended adding hospice service on discharge -Delirium precautions.  Hypertension: BP elevated -Continue amlodipine -Decrease IV fluid. -Increase PRN hydralazine. -Continue holding HCTZ  DVT prophylaxis: Subcu heparin Code Status: DNR/DNI Family Communication: Updated granddaughters at bedside Disposition Plan: Remains inpatient.  Anticipate discharge in the next 24-hour pending culture speciation and sensitivity. Consultants: Palliative care  Antimicrobials: Anti-infectives (From admission, onward)   Start     Dose/Rate Route Frequency Ordered Stop   04/29/19 1100  fluconazole (DIFLUCAN) tablet 150 mg  150 mg Oral  Once 04/29/19 1032     04/28/19 1000  cefTRIAXone (ROCEPHIN) 1 g in sodium chloride 0.9 % 100 mL IVPB     1 g 200 mL/hr over 30 Minutes Intravenous Every 24 hours 04/28/19 0217     04/27/19 2100  cefTRIAXone (ROCEPHIN) 1 g in sodium chloride 0.9 % 100 mL IVPB     1 g 200 mL/hr over 30 Minutes Intravenous  Once 04/27/19 2050 04/27/19 2209      Sch Meds:  Scheduled Meds: . amLODipine  10 mg Oral Daily  . brimonidine  1 drop Both Eyes Daily  . fluconazole  150 mg Oral Once  . heparin  5,000 Units Subcutaneous Q8H  . latanoprost  1 drop Both Eyes QHS   Continuous Infusions: . cefTRIAXone (ROCEPHIN)  IV 1 g (04/29/19 1006)  . lactated ringers 75 mL/hr at 04/29/19 0828  . sodium chloride     PRN Meds:.acetaminophen **OR** acetaminophen, hydrALAZINE, ondansetron **OR** ondansetron (ZOFRAN) IV   Johncarlo Maalouf T. [place]  If 7PM-7AM, please contact night-coverage [email or web address] Password [email or web address] 04/29/2019, 12:01 PM

## 2019-04-29 NOTE — Progress Notes (Signed)
PHARMACY - PHYSICIAN COMMUNICATION CRITICAL VALUE ALERT - BLOOD CULTURE IDENTIFICATION (BCID)  Tanya Smith is an 85 y.o. female who presented to [facility] on 04/27/2019 with a chief complaint of weakness  Assessment:  Patient with gram stain of GPR.   (include suspected source if known)  Name of physician (or Provider) Contacted: none  Current antibiotics: Ceftriaxone IV  Changes to prescribed antibiotics recommended:  Patient is on recommended antibiotics - No changes needed  GPR usually contaminate, BCID will not be performed per micro   [name] 04/29/2019  4:27 AM

## 2019-04-30 LAB — BASIC METABOLIC PANEL
Anion gap: 11 (ref 5–15)
BUN: 25 mg/dL — ABNORMAL HIGH (ref 8–23)
CO2: 22 mmol/L (ref 22–32)
Calcium: 8.9 mg/dL (ref 8.9–10.3)
Chloride: 107 mmol/L (ref 98–111)
Creatinine, Ser: 1.96 mg/dL — ABNORMAL HIGH (ref 0.44–1.00)
GFR calc Af Amer: 26 mL/min — ABNORMAL LOW (ref >60)
GFR calc non Af Amer: 22 mL/min — ABNORMAL LOW (ref >60)
Glucose, Bld: 83 mg/dL (ref 70–99)
Potassium: 3.7 mmol/L (ref 3.5–5.1)
Sodium: 140 mmol/L (ref 135–145)

## 2019-04-30 LAB — CULTURE, BLOOD (ROUTINE X 2): Special Requests: ADEQUATE

## 2019-04-30 LAB — GLUCOSE, CAPILLARY: Glucose-Capillary: 79 mg/dL (ref 70–99)

## 2019-04-30 LAB — MAGNESIUM: Magnesium: 2 mg/dL (ref 1.7–2.4)

## 2019-04-30 MED ORDER — LIP MEDEX EX OINT
TOPICAL_OINTMENT | CUTANEOUS | Status: DC | PRN
  Administered 2019-04-30: 11:00:00 via TOPICAL
  Filled 2019-04-30: qty 7

## 2019-04-30 MED ORDER — CEPHALEXIN 250 MG PO CAPS: 250.0000 mg | ORAL_CAPSULE | Freq: Two times a day (BID) | ORAL | 0 refills | Status: AC

## 2019-04-30 MED ORDER — AMLODIPINE BESYLATE 10 MG PO TABS: 10.0000 mg | ORAL_TABLET | Freq: Every day | ORAL | 0 refills | Status: AC

## 2019-04-30 NOTE — TOC Initial Note (Signed)
Transition of Care [facility]) - Initial/Assessment Note    Patient Details  Name: Tanya Smith MRN: [id number] Date of Birth: [birth year]  Transition of Care (TOC) CM/SW Contact:    Joaquin Courts, RN Phone Number: 04/30/2019, 11:41 AM  Clinical Narrative:     CM spoke with daughter via telephone. Daughter reports hospice has been in touch without regarding her mother, however patient is receiving a lot of services through [facility] and it is unclear at this time whether hospice and [facility] can provide services at the same time. Hospice to follow-up with daughter on Monday and clarify.               Expected Discharge Plan: Home/Self Care Barriers to Discharge: No Barriers Identified   Patient Goals and CMS Choice Patient states their goals for this hospitalization and ongoing recovery are:: to go home      Expected Discharge Plan and Services Expected Discharge Plan: Home/Self Care   Discharge Planning Services: CM Consult   Living arrangements for the past 2 months: Single Family Home Expected Discharge Date: 04/30/19               DME Arranged: N/A DME Agency: NA       HH Arranged: NA [place] Agency: NA        Prior Living Arrangements/Services Living arrangements for the past 2 months: Single Family Home Lives with:: Adult Children Patient language and need for interpreter reviewed:: Yes Do you feel safe going back to the place where you live?: Yes      Need for Family Participation in Patient Care: Yes (Comment) Care giver support system in place?: Yes (comment)   Criminal Activity/Legal Involvement Pertinent to Current Situation/Hospitalization: No - Comment as needed  Activities of Daily Living Home Assistive Devices/Equipment: Eyeglasses, Environmental consultant (specify type), Wheelchair(front wheeled walker) ADL Screening (condition at time of admission) Patient's cognitive ability adequate to safely complete daily activities?: No Is the patient deaf or have difficulty hearing?:  No Does the patient have difficulty seeing, even when wearing glasses/contacts?: No Does the patient have difficulty concentrating, remembering, or making decisions?: Yes Patient able to express need for assistance with ADLs?: No Does the patient have difficulty dressing or bathing?: Yes Independently performs ADLs?: No Communication: Needs assistance(patient non verbal but hums) Is this a change from baseline?: Pre-admission baseline Dressing (OT): Dependent Is this a change from baseline?: Change from baseline, expected to last >3 days Grooming: Dependent Is this a change from baseline?: Change from baseline, expected to last >3 days Feeding: Dependent Is this a change from baseline?: Change from baseline, expected to last >3 days Bathing: Dependent Is this a change from baseline?: Change from baseline, expected to last >3 days Toileting: Dependent Is this a change from baseline?: Change from baseline, expected to last >3days In/Out Bed: Dependent Is this a change from baseline?: Change from baseline, expected to last >3 days Walks in Home: Dependent Is this a change from baseline?: Change from baseline, expected to last >3 days Does the patient have difficulty walking or climbing stairs?: Yes Weakness of Legs: Both Weakness of Arms/Hands: Both  Permission Sought/Granted                  Emotional Assessment Appearance:: Appears stated age Attitude/Demeanor/Rapport: Other (comment)(nonverbal) Affect (typically observed): Calm     Psych Involvement: No (comment)  Admission diagnosis:  Acute UTI [N39.0] Acute kidney injury ([place]) [N17.9] ARF (acute renal failure) ([place]) [N17.9] Patient Active Problem List  Diagnosis Date Noted  . ARF (acute renal failure) ([place]) 04/28/2019  . AKI (acute kidney injury) ([place]) 04/28/2019  . Adult failure to thrive   . Bacteremia 11/04/2018  . Seizures ([place]) 09/22/2017  . SDH (subdural hematoma) ([place]) 02/22/2017  . Moderate dementia  ([place]) 05/08/2014  . Chest pain 12/23/2012  . Hypokalemia 12/23/2012  . Hypertension 09/30/2012  . Diabetes mellitus ([place]) 09/30/2012  . Dehydration 09/30/2012  . Weakness generalized 09/30/2012  . Acute kidney injury ([place]) 09/30/2012  . Acute UTI 09/30/2012  . Candidiasis of vagina 09/30/2012  . Dementia ([place]) 09/30/2012  . Chronic kidney disease 09/30/2012  . Diastolic dysfunction 81/59/4707   PCP:  Lavone Orn, MD Pharmacy:   [facility], [place] [address] Phone: [phone] Fax: [phone]  [id number] [place], [place] [address] Phone: [phone] Fax: [phone]     Social Determinants of Health (SDOH) Interventions    Readmission Risk Interventions No flowsheet data found.

## 2019-04-30 NOTE — Plan of Care (Addendum)
Pt discharging home today with hospice services per Dr. Florene Glen. Pt's IV site D/c'd and WDL. Pt's VSS. Pt's granddaughter, Hinton Dyer, provided with home medication list, discharge instructions and prescriptions. Verbalized understanding. Pt's granddaughter providing transport home.

## 2019-04-30 NOTE — Progress Notes (Signed)
[facility] -- Manufacturing engineer [facility]) Hospital Liason Note  Notified by Dr. Florene Glen of family request for [facility] services at home after discharge. Nancy Marus, RNCM aware. Chart and patient information reviewed by [facility] physician. Eligibility has been confirmed.  Spoke with [name] (daughter) to initiate education related to hospice philosophy, services and team approach to care. Daughter verbalized understanding of information given.   Please send signed and completed out of facility DNR form home with patient/family.  Patient will need prescriptions for discharge comfort medications.  DME needs discussed, patient currently has a walker and wheelchair in the home. Daughter requests a 3 in 1 and a hospital bed with full rails and OBT. [employer] has been notified for delivery of the equipment. Home address has been verified and is correct in the chart. [name] is the family member to contact to arrange for delivery at [phone].  [facility] aware of the above. Completed discharge summary will need to be faxed to [facility] when final. [facility] is aware pt has discharged from the hospital at this time. Above, information shared with Nancy Marus, RNCM.  Please call with any hospice related questions or concerns.  Thank you for this referral.  Margaretmary Eddy, RN, BSN [place] [phone]  [place] are on [place] listed under Hospice and [place].

## 2019-04-30 NOTE — Discharge Summary (Addendum)
Physician Discharge Summary  Tanya Smith GYI:[id number] DOB: 1930-09-08 DOA: 04/27/2019  PCP: Tanya Orn, MD  Admit date: 04/27/2019 Discharge date: 04/30/2019  Time spent: 40 minutes  Recommendations for Outpatient Follow-up:  1. Follow up outpatient CBC/CMP 2. Plan for home hospice as outpatient 3. Follow final blood cultures, suspected contaminant   Discharge Diagnoses:  Principal Problem:   ARF (acute renal failure) ([place]) Active Problems:   Hypertension   Weakness generalized   Acute UTI   Dementia (HCC)   Adult failure to thrive   AKI (acute kidney injury) [facility])   Discharge Condition: stable  Diet recommendation: heart healthy  Filed Weights   04/29/19 0415 04/30/19 0500  Weight: 63.9 kg 61.1 kg    History of present illness:  85 year old female with history of advanced dementia, hypertension, CKD 4 and nonverbal status presenting from home with weakness, p.o. intake and confusion.  Admitted for AKI and UTI on IV fluid and IV Rocephin.  See below for additional details  Hospital Course:  AKI/mild azotemia/CKD-4: Likely combination of prerenal and ATN.  Improving. -creatinine 1.96 on day of discharge, appears close to baseline of ~1.8 - HCTZ discontinued at discharge due to CKD  E. Coli UTI: discharged on keflex to complete Tanya Smith 7 day course  Advanced dementia: Patient is totally disoriented and nonverbal.  Reportedly has 8 hours of RN service at home per palliative care. -Palliative care recommended adding hospice service on discharge - discussed with hospice and planning for home hospice at discharge -Delirium precautions.  Hypertension: BP elevated, fluctuating.  Continue to monitor outpatient. -Continue amlodipine -Decrease IV fluid. -Increase PRN hydralazine. -Continue holding HCTZ  Diphtheroids and moraxella growing on 1 of 2 blood cx (aerobic bottle only): suspected contaminant given diphtheroids and moraxella.  pt has improved.  Follow pending blood  cultures.  Discussed need to follow up final blood cx with PCP.  Discharged on cefdinir given moraxella after discussion with family (called into [facility]).  Procedures:  none  Consultations:  hospice  Discharge Exam: Vitals:   04/29/19 2027 04/30/19 0640  BP: (!) 157/77 (!) 195/104  Pulse: 91 84  Resp: 18 20  Temp: 97.7 F (36.5 C) 97.8 F (36.6 C)  SpO2: 100% 100%   Nonverbal Granddaughter at bedside, thinks she's close to normal mental status, but Tanya Smith bit weak from hospitalization Discussed d/c plan  General: No acute distress. Eating breakfast.  Cardiovascular: Heart sounds show Tanya Smith regular rate, and rhythm.  Lungs: Clear to auscultation bilaterally  Abdomen: Soft, nontender, nondistended  Neurological: nonverbal.  Moving all extremities.  Skin: Warm and dry. No rashes or lesions. Extremities: No clubbing or cyanosis. No edema.  Discharge Instructions   Discharge Instructions    Call MD for:  difficulty breathing, headache or visual disturbances   Complete by: As directed    Call MD for:  extreme fatigue   Complete by: As directed    Call MD for:  hives   Complete by: As directed    Call MD for:  persistant dizziness or light-headedness   Complete by: As directed    Call MD for:  persistant nausea and vomiting   Complete by: As directed    Call MD for:  redness, tenderness, or signs of infection (pain, swelling, redness, odor or green/yellow discharge around incision site)   Complete by: As directed    Call MD for:  severe uncontrolled pain   Complete by: As directed    Call MD for:  temperature >100.4  Complete by: As directed    Diet - low sodium heart healthy   Complete by: As directed    Discharge instructions   Complete by: As directed    You were seen for Tanya Smith urinary tract infection.  You had 1 blood culture grow what we think is Tanya Smith contaminant, this culture should be followed outpatient.  This has improved with antibiotics.  We're discharging you home with  an additional 4 days of antibiotics.  Your blood pressure is high.  We stopped your HCTZ due to your kidney function.  You're on Tanya Smith medicine called amlodipine now.  Your blood pressure should be followed outpatient.  We'll help arrange home hospice.  Return for new, recurrent, or worsening symptoms.  Please ask your PCP to request records from this hospitalization so they know what was done and what the next steps will be.   Increase activity slowly   Complete by: As directed      Allergies as of 04/30/2019      Reactions   Ace Inhibitors Other (See Comments)   Furosemide Other (See Comments)   Unknown. Per MAR. Can take brand name Lasix.    Sertraline Other (See Comments)   Unknown; per physician's orders    Latex Rash   Other Rash   Soap & deodorant.   Tape Rash      Medication List    STOP taking these medications   cefdinir 300 MG capsule Commonly known as: OMNICEF   hydrochlorothiazide 12.5 MG tablet Commonly known as: HYDRODIURIL     TAKE these medications   acetaminophen 325 MG tablet Commonly known as: TYLENOL Take 325-650 mg by mouth every 6 (six) hours as needed for moderate pain.   amLODipine 10 MG tablet Commonly known as: NORVASC Take 1 tablet (10 mg total) by mouth daily.   Balmex 11.3 % Crea cream Generic drug: zinc oxide Apply 1 application topically daily as needed (rash).   bisacodyl 5 MG EC tablet Generic drug: bisacodyl Take 5 mg by mouth daily as needed.   brimonidine 0.15 % ophthalmic solution Commonly known as: ALPHAGAN Place 1 drop into both eyes daily.   cephALEXin 250 MG capsule Commonly known as: KEFLEX Take 1 capsule (250 mg total) by mouth 2 (two) times daily for 4 days.   fluconazole 150 MG tablet Commonly known as: Diflucan Take 1 tablet by mouth on first day of antibiotic and then take 1 tablet by mouth on last day of antibiotic   fluticasone 50 MCG/ACT nasal spray Commonly known as: FLONASE Place 1 spray into both  nostrils daily as needed for allergies.   latanoprost 0.005 % ophthalmic solution Commonly known as: XALATAN Place 1 drop into both eyes at bedtime.   mirtazapine 30 MG tablet Commonly known as: REMERON Take 30 mg by mouth at bedtime.   Nutritional Drink Liqd Take 1 Container by mouth daily as needed. When she does not eat   senna-docusate 8.6-50 MG tablet Commonly known as: Senokot-S Take 1 tablet by mouth at bedtime as needed for mild constipation.      Allergies  Allergen Reactions  . Ace Inhibitors Other (See Comments)  . Furosemide Other (See Comments)    Unknown. Per MAR. Can take brand name Lasix.   . Sertraline Other (See Comments)    Unknown; per physician's orders   . Latex Rash  . Other Rash    Soap & deodorant.  . Tape Rash      The results of significant diagnostics  from this hospitalization (including imaging, microbiology, ancillary and laboratory) are listed below for reference.    Significant Diagnostic Studies: Ct Head Wo Contrast  Result Date: 04/27/2019 CLINICAL DATA:  Altered level of consciousness EXAM: CT HEAD WITHOUT CONTRAST TECHNIQUE: Contiguous axial images were obtained from the base of the skull through the vertex without intravenous contrast. COMPARISON:  11/03/2018 FINDINGS: Brain: Mild atrophic changes are again identified as well as chronic white matter ischemic change. No findings to suggest acute hemorrhage, acute infarction or space-occupying mass lesion are noted. Stable infarcts are noted in the right temporal lobe and left cerebellar hemisphere. Vascular: No hyperdense vessel or unexpected calcification. Skull: Normal. Negative for fracture or focal lesion. Sinuses/Orbits: No acute finding. Other: None. IMPRESSION: Chronic atrophic and ischemic changes.  No acute abnormality noted Electronically Signed   By: Inez Catalina M.D.   On: 04/27/2019 21:27   Dg Chest Portable 1 View  Result Date: 04/27/2019 CLINICAL DATA:  Weakness EXAM:  PORTABLE CHEST 1 VIEW COMPARISON:  11/03/2018 FINDINGS: Suspected retrocardiac hiatal hernia. Atherosclerotic calcification of the aortic arch. Tortuous thoracic aorta. Borderline cardiomegaly. Degenerative right glenohumeral arthropathy with elimination of the acromial humeral space compatible with chronic rotator cuff tear on the right. Thoracic spondylosis. No blunting of the costophrenic angles. IMPRESSION: 1. Suspected retrocardiac hiatal hernia. 2.  Aortic Atherosclerosis (ICD10-I70.0). 3. Borderline enlargement of the cardiopericardial silhouette 4. Chronic rotator cuff tear on the right. Electronically Signed   By: Van Clines M.D.   On: 04/27/2019 20:10    Microbiology: Recent Results (from the past 240 hour(s))  Urine culture     Status: Abnormal   Collection Time: 04/27/19  7:36 PM   Specimen: Urine, Catheterized  Result Value Ref Range Status   Specimen Description   Final    URINE, CATHETERIZED Performed at [facility] [address], [place], [place] [address]    Special Requests   Final    NONE Performed at [facility], [place] [address] [place], [place] [address]    Culture >=100,000 COLONIES/mL ESCHERICHIA COLI (Newton Frutiger)  Final   Report Status 04/29/2019 FINAL  Final   Organism ID, Bacteria ESCHERICHIA COLI (Winni Ehrhard)  Final      Susceptibility   Escherichia coli - MIC*    AMPICILLIN >=32 RESISTANT Resistant     CEFAZOLIN <=4 SENSITIVE Sensitive     CEFTRIAXONE <=1 SENSITIVE Sensitive     CIPROFLOXACIN <=0.25 SENSITIVE Sensitive     GENTAMICIN >=16 RESISTANT Resistant     IMIPENEM <=0.25 SENSITIVE Sensitive     NITROFURANTOIN <=16 SENSITIVE Sensitive     TRIMETH/SULFA <=20 SENSITIVE Sensitive     AMPICILLIN/SULBACTAM >=32 RESISTANT Resistant     PIP/TAZO <=4 SENSITIVE Sensitive     Extended ESBL NEGATIVE Sensitive     * >=100,000 COLONIES/mL ESCHERICHIA COLI  Culture, blood (routine x 2)     Status: None (Preliminary result)    Collection Time: 04/27/19  8:51 PM   Specimen: BLOOD  Result Value Ref Range Status   Specimen Description   Final    BLOOD LEFT ANTECUBITAL Performed at [facility] [address], [place], [place] [address]    Special Requests   Final    BOTTLES DRAWN AEROBIC ONLY Blood Culture adequate volume Performed at [facility] [address] [place], [place] [address]    Culture  Setup Time   Final    AEROBIC BOTTLE ONLY GRAM POSITIVE RODS CRITICAL RESULT CALLED TO, READ BACK  BY AND VERIFIED WITHLavell Luster [facility] 04/29/19 0208 JDW Performed at [facility], [place] [address] [place], [place] [address]    Culture GRAM POSITIVE RODS  Final   Report Status PENDING  Incomplete  SARS Coronavirus 2 (CEPHEID - Performed in [place] hospital lab), Hosp Order     Status: None   Collection Time: 04/27/19 10:29 PM   Specimen: Nasopharyngeal Swab  Result Value Ref Range Status   SARS Coronavirus 2 NEGATIVE NEGATIVE Final    Comment: (NOTE) If result is NEGATIVE SARS-CoV-2 target nucleic acids are NOT DETECTED. The SARS-CoV-2 RNA is generally detectable in upper and lower  respiratory specimens during the acute phase of infection. The lowest  concentration of SARS-CoV-2 viral copies this assay can detect is 250  copies / mL. Adrin Julian negative result does not preclude SARS-CoV-2 infection  and should not be used as the sole basis for treatment or other  patient management decisions.  Khyre Germond negative result may occur with  improper specimen collection / handling, submission of specimen other  than nasopharyngeal swab, presence of viral mutation(s) within the  areas targeted by this assay, and inadequate number of viral copies  (<250 copies / mL). Mandrell Vangilder negative result must be combined with clinical  observations, patient history, and epidemiological information. If result is POSITIVE SARS-CoV-2 target nucleic acids are DETECTED. The SARS-CoV-2 RNA is generally  detectable in upper and lower  respiratory specimens dur ing the acute phase of infection.  Positive  results are indicative of active infection with SARS-CoV-2.  Clinical  correlation with patient history and other diagnostic information is  necessary to determine patient infection status.  Positive results do  not rule out bacterial infection or co-infection with other viruses. If result is PRESUMPTIVE POSTIVE SARS-CoV-2 nucleic acids MAY BE PRESENT.   Gid Schoffstall presumptive positive result was obtained on the submitted specimen  and confirmed on repeat testing.  While 2019 novel coronavirus  (SARS-CoV-2) nucleic acids may be present in the submitted sample  additional confirmatory testing may be necessary for epidemiological  and / or clinical management purposes  to differentiate between  SARS-CoV-2 and other Sarbecovirus currently known to infect humans.  If clinically indicated additional testing with an alternate test  methodology [phone]) is advised. The SARS-CoV-2 RNA is generally  detectable in upper and lower respiratory sp ecimens during the acute  phase of infection. The expected result is Negative. Fact Sheet for Patients:  StrictlyIdeas.no Fact Sheet for Healthcare Providers: BankingDealers.co.za This test is not yet approved or cleared by the [place] FDA and has been authorized for detection and/or diagnosis of SARS-CoV-2 by FDA under an Emergency Use Authorization (EUA).  This EUA will remain in effect (meaning this test can be used) for the duration of the COVID-19 declaration under Section 564(b)(1) of the Act, 21 U.S.C. section 360bbb-3(b)(1), unless the authorization is terminated or revoked sooner. Performed at [facility], [place] [address], [place], [place] [address]   Culture, blood (routine x 2)     Status: None (Preliminary result)   Collection Time: 04/28/19  2:14 AM   Specimen: BLOOD  Result  Value Ref Range Status   Specimen Description   Final    BLOOD BLOOD RIGHT ARM Performed at [facility] [address], [place], [place] [address]    Special Requests   Final    BOTTLES DRAWN AEROBIC AND ANAEROBIC Blood Culture results may not be optimal due to an inadequate volume of blood  received in culture bottles Performed at [facility], [place] [address] [place], [place] [address]    Culture   Final    NO GROWTH 1 DAY Performed at [facility], [place] [address], [place], [place] [address]    Report Status PENDING  Incomplete     Labs: Basic Metabolic Panel: Recent Labs  Lab 04/27/19 1912 04/28/19 0549 04/29/19 0510 04/30/19 0502  NA 145 145 141 140  K 3.5 3.6 3.6 3.7  CL 113* 115* 111 107  CO2 23 21* 23 22  GLUCOSE 131* 97 95 83  BUN 43* 37* 31* 25*  CREATININE 2.56* 2.17* 2.03* 1.96*  CALCIUM 9.0 8.6* 8.5* 8.9  MG  --  2.0 1.8 2.0   Liver Function Tests: Recent Labs  Lab 04/28/19 0549  AST 24  ALT 16  ALKPHOS 82  BILITOT 0.1*  PROT 6.8  ALBUMIN 3.0*   No results for input(s): LIPASE, AMYLASE in the last 168 hours. No results for input(s): AMMONIA in the last 168 hours. CBC: Recent Labs  Lab 04/27/19 1915 04/28/19 0549  WBC 6.2 6.8  NEUTROABS 3.8 3.7  HGB 11.7* 11.3*  HCT 39.0 37.3  MCV 93.3 92.6  PLT 190 189   Cardiac Enzymes: No results for input(s): CKTOTAL, CKMB, CKMBINDEX, TROPONINI in the last 168 hours. BNP: BNP (last 3 results) No results for input(s): BNP in the last 8760 hours.  ProBNP (last 3 results) No results for input(s): PROBNP in the last 8760 hours.  CBG: Recent Labs  Lab 04/29/19 0024 04/29/19 0412 04/29/19 1218 04/29/19 1753 04/29/19 2359  GLUCAP 145* 89 92 83 79       Signed:  Fayrene Helper MD.  [facility] 04/30/2019, 9:36 AM

## 2019-05-02 DIAGNOSIS — I69818 Other symptoms and signs involving cognitive functions following other cerebrovascular disease: Secondary | ICD-10-CM | POA: Diagnosis not present

## 2019-05-02 DIAGNOSIS — F329 Major depressive disorder, single episode, unspecified: Secondary | ICD-10-CM | POA: Diagnosis not present

## 2019-05-02 DIAGNOSIS — N8110 Cystocele, unspecified: Secondary | ICD-10-CM | POA: Diagnosis not present

## 2019-05-02 DIAGNOSIS — J302 Other seasonal allergic rhinitis: Secondary | ICD-10-CM | POA: Diagnosis not present

## 2019-05-02 DIAGNOSIS — H409 Unspecified glaucoma: Secondary | ICD-10-CM | POA: Diagnosis not present

## 2019-05-02 DIAGNOSIS — N184 Chronic kidney disease, stage 4 (severe): Secondary | ICD-10-CM | POA: Diagnosis not present

## 2019-05-02 DIAGNOSIS — Z8669 Personal history of other diseases of the nervous system and sense organs: Secondary | ICD-10-CM | POA: Diagnosis not present

## 2019-05-02 DIAGNOSIS — F0150 Vascular dementia without behavioral disturbance: Secondary | ICD-10-CM | POA: Diagnosis not present

## 2019-05-02 DIAGNOSIS — M109 Gout, unspecified: Secondary | ICD-10-CM | POA: Diagnosis not present

## 2019-05-02 DIAGNOSIS — E785 Hyperlipidemia, unspecified: Secondary | ICD-10-CM | POA: Diagnosis not present

## 2019-05-02 DIAGNOSIS — Z8679 Personal history of other diseases of the circulatory system: Secondary | ICD-10-CM | POA: Diagnosis not present

## 2019-05-02 DIAGNOSIS — I130 Hypertensive heart and chronic kidney disease with heart failure and stage 1 through stage 4 chronic kidney disease, or unspecified chronic kidney disease: Secondary | ICD-10-CM | POA: Diagnosis not present

## 2019-05-02 DIAGNOSIS — R634 Abnormal weight loss: Secondary | ICD-10-CM | POA: Diagnosis not present

## 2019-05-02 DIAGNOSIS — H9190 Unspecified hearing loss, unspecified ear: Secondary | ICD-10-CM | POA: Diagnosis not present

## 2019-05-02 DIAGNOSIS — E1122 Type 2 diabetes mellitus with diabetic chronic kidney disease: Secondary | ICD-10-CM | POA: Diagnosis not present

## 2019-05-02 DIAGNOSIS — I509 Heart failure, unspecified: Secondary | ICD-10-CM | POA: Diagnosis not present

## 2019-05-02 DIAGNOSIS — M549 Dorsalgia, unspecified: Secondary | ICD-10-CM | POA: Diagnosis not present

## 2019-05-03 LAB — CULTURE, BLOOD (ROUTINE X 2): Culture: NO GROWTH

## 2019-05-06 ENCOUNTER — Other Ambulatory Visit: Payer: Self-pay | Admitting: *Deleted

## 2019-05-06 NOTE — Patient Outreach (Addendum)
  [facility]) Care Management  05/06/2019  Tanya Smith 1930/02/05 [id number]   EMMI-general discharge d/c from [facility] 4 Date: 05/05/19 1312 [place] Reason: Lost interest in things? Yes Sad/hopeless/anxious/empty? Yes  Insurance: medicare  [facility] admissions x 2 ED visits x 2 in the last 6 months  Last admission 04/27/19 to 04/30/19 for ARF, HTN, weakness   Social: Dependent in all care needs   Conditions: HTN, DM, Dementia, SDH, acute UTI, ARF, seizures, AKI, candidiasisi of vagina, CKD 4, weakness, diastolic dysfunction, hypokalemia, bacteremia   DME: eyeglasses, w/c,  Front wheeled walker  Outreach attempt # 1 No answer. [facility] RN CM left HIPAA compliant voicemail message along with CM's contact info.   Plan: [facility] RN CM sent an unsuccessful outreach letter and scheduled this patient for another call attempt within 7 business days  Nikola Blackston L. Lavina [place], RN, BSN, [place] Coordinator Office number [phone] Mobile number [phone]  Main [facility] number [phone] Fax number [phone]

## 2019-05-08 DIAGNOSIS — E1122 Type 2 diabetes mellitus with diabetic chronic kidney disease: Secondary | ICD-10-CM | POA: Diagnosis not present

## 2019-05-08 DIAGNOSIS — I509 Heart failure, unspecified: Secondary | ICD-10-CM | POA: Diagnosis not present

## 2019-05-08 DIAGNOSIS — I130 Hypertensive heart and chronic kidney disease with heart failure and stage 1 through stage 4 chronic kidney disease, or unspecified chronic kidney disease: Secondary | ICD-10-CM | POA: Diagnosis not present

## 2019-05-08 DIAGNOSIS — F0150 Vascular dementia without behavioral disturbance: Secondary | ICD-10-CM | POA: Diagnosis not present

## 2019-05-08 DIAGNOSIS — I69818 Other symptoms and signs involving cognitive functions following other cerebrovascular disease: Secondary | ICD-10-CM | POA: Diagnosis not present

## 2019-05-08 DIAGNOSIS — N184 Chronic kidney disease, stage 4 (severe): Secondary | ICD-10-CM | POA: Diagnosis not present

## 2019-05-09 DIAGNOSIS — N184 Chronic kidney disease, stage 4 (severe): Secondary | ICD-10-CM | POA: Diagnosis not present

## 2019-05-09 DIAGNOSIS — E1122 Type 2 diabetes mellitus with diabetic chronic kidney disease: Secondary | ICD-10-CM | POA: Diagnosis not present

## 2019-05-09 DIAGNOSIS — F0150 Vascular dementia without behavioral disturbance: Secondary | ICD-10-CM | POA: Diagnosis not present

## 2019-05-09 DIAGNOSIS — I509 Heart failure, unspecified: Secondary | ICD-10-CM | POA: Diagnosis not present

## 2019-05-09 DIAGNOSIS — I69818 Other symptoms and signs involving cognitive functions following other cerebrovascular disease: Secondary | ICD-10-CM | POA: Diagnosis not present

## 2019-05-09 DIAGNOSIS — I130 Hypertensive heart and chronic kidney disease with heart failure and stage 1 through stage 4 chronic kidney disease, or unspecified chronic kidney disease: Secondary | ICD-10-CM | POA: Diagnosis not present

## 2019-05-10 ENCOUNTER — Other Ambulatory Visit: Payer: Self-pay

## 2019-05-10 ENCOUNTER — Ambulatory Visit: Payer: Self-pay | Admitting: *Deleted

## 2019-05-10 NOTE — Patient Outreach (Signed)
[facility]) Care Management  05/10/2019  Tanya Smith 1930-10-19 [id number]   EMMI-general discharge d/c from [facility] # 4 Date: 05/05/19 1312 [place] Reason: Lost interest in things? Yes Sad/hopeless/anxious/empty? Yes  Insurance: medicare  [facility] admissions x 2 ED visits x 2 in the last 6 months  Last admission 04/27/19 to 04/30/19 for ARF, HTN, weakness  Spoke with daughter [name].  She is able to verify HIPAA.  She states patient is improved some but eating very little.  She states that hospice is involved with patient and they are coming at least twice a week and as needed.  She declined any further needs or concerns at this time.    Plan: RN CM will close case.    Jone Baseman, RN, MSN [facility] Care Management Coordinator Direct Line [phone] Cell [phone] Toll Free: [phone]  Fax: [phone]

## 2019-05-16 ENCOUNTER — Ambulatory Visit: Payer: Medicare Other | Admitting: *Deleted

## 2019-05-16 DIAGNOSIS — I69818 Other symptoms and signs involving cognitive functions following other cerebrovascular disease: Secondary | ICD-10-CM | POA: Diagnosis not present

## 2019-05-16 DIAGNOSIS — I130 Hypertensive heart and chronic kidney disease with heart failure and stage 1 through stage 4 chronic kidney disease, or unspecified chronic kidney disease: Secondary | ICD-10-CM | POA: Diagnosis not present

## 2019-05-16 DIAGNOSIS — N184 Chronic kidney disease, stage 4 (severe): Secondary | ICD-10-CM | POA: Diagnosis not present

## 2019-05-16 DIAGNOSIS — E1122 Type 2 diabetes mellitus with diabetic chronic kidney disease: Secondary | ICD-10-CM | POA: Diagnosis not present

## 2019-05-16 DIAGNOSIS — F0150 Vascular dementia without behavioral disturbance: Secondary | ICD-10-CM | POA: Diagnosis not present

## 2019-05-16 DIAGNOSIS — I509 Heart failure, unspecified: Secondary | ICD-10-CM | POA: Diagnosis not present

## 2019-05-17 DIAGNOSIS — I69818 Other symptoms and signs involving cognitive functions following other cerebrovascular disease: Secondary | ICD-10-CM | POA: Diagnosis not present

## 2019-05-17 DIAGNOSIS — E1122 Type 2 diabetes mellitus with diabetic chronic kidney disease: Secondary | ICD-10-CM | POA: Diagnosis not present

## 2019-05-17 DIAGNOSIS — F0150 Vascular dementia without behavioral disturbance: Secondary | ICD-10-CM | POA: Diagnosis not present

## 2019-05-17 DIAGNOSIS — I509 Heart failure, unspecified: Secondary | ICD-10-CM | POA: Diagnosis not present

## 2019-05-17 DIAGNOSIS — I130 Hypertensive heart and chronic kidney disease with heart failure and stage 1 through stage 4 chronic kidney disease, or unspecified chronic kidney disease: Secondary | ICD-10-CM | POA: Diagnosis not present

## 2019-05-17 DIAGNOSIS — N184 Chronic kidney disease, stage 4 (severe): Secondary | ICD-10-CM | POA: Diagnosis not present

## 2019-05-18 DIAGNOSIS — I130 Hypertensive heart and chronic kidney disease with heart failure and stage 1 through stage 4 chronic kidney disease, or unspecified chronic kidney disease: Secondary | ICD-10-CM | POA: Diagnosis not present

## 2019-05-18 DIAGNOSIS — I69818 Other symptoms and signs involving cognitive functions following other cerebrovascular disease: Secondary | ICD-10-CM | POA: Diagnosis not present

## 2019-05-18 DIAGNOSIS — I509 Heart failure, unspecified: Secondary | ICD-10-CM | POA: Diagnosis not present

## 2019-05-18 DIAGNOSIS — E1122 Type 2 diabetes mellitus with diabetic chronic kidney disease: Secondary | ICD-10-CM | POA: Diagnosis not present

## 2019-05-18 DIAGNOSIS — N184 Chronic kidney disease, stage 4 (severe): Secondary | ICD-10-CM | POA: Diagnosis not present

## 2019-05-18 DIAGNOSIS — F0150 Vascular dementia without behavioral disturbance: Secondary | ICD-10-CM | POA: Diagnosis not present

## 2019-05-19 DIAGNOSIS — I509 Heart failure, unspecified: Secondary | ICD-10-CM | POA: Diagnosis not present

## 2019-05-19 DIAGNOSIS — I69818 Other symptoms and signs involving cognitive functions following other cerebrovascular disease: Secondary | ICD-10-CM | POA: Diagnosis not present

## 2019-05-19 DIAGNOSIS — N184 Chronic kidney disease, stage 4 (severe): Secondary | ICD-10-CM | POA: Diagnosis not present

## 2019-05-19 DIAGNOSIS — E1122 Type 2 diabetes mellitus with diabetic chronic kidney disease: Secondary | ICD-10-CM | POA: Diagnosis not present

## 2019-05-19 DIAGNOSIS — I130 Hypertensive heart and chronic kidney disease with heart failure and stage 1 through stage 4 chronic kidney disease, or unspecified chronic kidney disease: Secondary | ICD-10-CM | POA: Diagnosis not present

## 2019-05-19 DIAGNOSIS — F0150 Vascular dementia without behavioral disturbance: Secondary | ICD-10-CM | POA: Diagnosis not present

## 2019-05-20 DIAGNOSIS — I69818 Other symptoms and signs involving cognitive functions following other cerebrovascular disease: Secondary | ICD-10-CM | POA: Diagnosis not present

## 2019-05-20 DIAGNOSIS — E1122 Type 2 diabetes mellitus with diabetic chronic kidney disease: Secondary | ICD-10-CM | POA: Diagnosis not present

## 2019-05-20 DIAGNOSIS — I509 Heart failure, unspecified: Secondary | ICD-10-CM | POA: Diagnosis not present

## 2019-05-20 DIAGNOSIS — I130 Hypertensive heart and chronic kidney disease with heart failure and stage 1 through stage 4 chronic kidney disease, or unspecified chronic kidney disease: Secondary | ICD-10-CM | POA: Diagnosis not present

## 2019-05-20 DIAGNOSIS — N184 Chronic kidney disease, stage 4 (severe): Secondary | ICD-10-CM | POA: Diagnosis not present

## 2019-05-20 DIAGNOSIS — F0150 Vascular dementia without behavioral disturbance: Secondary | ICD-10-CM | POA: Diagnosis not present

## 2019-05-28 DEATH — deceased
